# Patient Record
Sex: Female | Born: 1935 | Race: Black or African American | Hispanic: No | Marital: Single | Smoking: Never smoker
Health system: Southern US, Community
[De-identification: ages and names within clinical notes are randomized; demographics above are authoritative.]

## PROBLEM LIST (undated history)

## (undated) DIAGNOSIS — G9341 Metabolic encephalopathy: Secondary | ICD-10-CM

## (undated) DIAGNOSIS — M109 Gout, unspecified: Secondary | ICD-10-CM

## (undated) DIAGNOSIS — J40 Bronchitis, not specified as acute or chronic: Secondary | ICD-10-CM

## (undated) DIAGNOSIS — G459 Transient cerebral ischemic attack, unspecified: Secondary | ICD-10-CM

## (undated) DIAGNOSIS — E785 Hyperlipidemia, unspecified: Secondary | ICD-10-CM

## (undated) DIAGNOSIS — E119 Type 2 diabetes mellitus without complications: Secondary | ICD-10-CM

## (undated) DIAGNOSIS — I10 Essential (primary) hypertension: Secondary | ICD-10-CM

## (undated) DIAGNOSIS — N189 Chronic kidney disease, unspecified: Secondary | ICD-10-CM

## (undated) DIAGNOSIS — D696 Thrombocytopenia, unspecified: Secondary | ICD-10-CM

## (undated) DIAGNOSIS — E559 Vitamin D deficiency, unspecified: Secondary | ICD-10-CM

## (undated) DIAGNOSIS — D631 Anemia in chronic kidney disease: Secondary | ICD-10-CM

## (undated) DIAGNOSIS — I2510 Atherosclerotic heart disease of native coronary artery without angina pectoris: Secondary | ICD-10-CM

## (undated) HISTORY — PX: FRACTURE SURGERY: SHX138

---

## 2016-12-19 ENCOUNTER — Emergency Department: Payer: Medicare Other

## 2016-12-19 ENCOUNTER — Emergency Department
Admission: EM | Admit: 2016-12-19 | Discharge: 2016-12-19 | Disposition: A | Discharge status: Home / Self Care | Payer: Medicare Other | Attending: Emergency Medicine | Admitting: Emergency Medicine

## 2016-12-19 ENCOUNTER — Encounter: Payer: Self-pay | Admitting: Emergency Medicine

## 2016-12-19 DIAGNOSIS — R0781 Pleurodynia: Secondary | ICD-10-CM | POA: Diagnosis not present

## 2016-12-19 DIAGNOSIS — Y9301 Activity, walking, marching and hiking: Secondary | ICD-10-CM | POA: Diagnosis not present

## 2016-12-19 DIAGNOSIS — Y929 Unspecified place or not applicable: Secondary | ICD-10-CM | POA: Diagnosis not present

## 2016-12-19 DIAGNOSIS — E119 Type 2 diabetes mellitus without complications: Secondary | ICD-10-CM | POA: Insufficient documentation

## 2016-12-19 DIAGNOSIS — F17290 Nicotine dependence, other tobacco product, uncomplicated: Secondary | ICD-10-CM | POA: Insufficient documentation

## 2016-12-19 DIAGNOSIS — Y999 Unspecified external cause status: Secondary | ICD-10-CM | POA: Insufficient documentation

## 2016-12-19 DIAGNOSIS — W19XXXA Unspecified fall, initial encounter: Secondary | ICD-10-CM | POA: Diagnosis not present

## 2016-12-19 DIAGNOSIS — S7002XA Contusion of left hip, initial encounter: Secondary | ICD-10-CM | POA: Diagnosis not present

## 2016-12-19 DIAGNOSIS — S79912A Unspecified injury of left hip, initial encounter: Secondary | ICD-10-CM | POA: Diagnosis present

## 2016-12-19 HISTORY — DX: Type 2 diabetes mellitus without complications: E11.9

## 2016-12-19 NOTE — ED Notes (Signed)
Left hip pain status post fall last week. Pt able to bare weight, move leg and denies pain. Bilateral feet swollen. +1 pulse.

## 2016-12-19 NOTE — ED Provider Notes (Signed)
[facility] Emergency Department Provider Note  ____________________________________________   First MD Initiated Contact with Patient 12/19/16 1256     (approximate)  I have reviewed the triage vital signs and the nursing notes.   HISTORY  Chief Complaint Fall    HPI Kim Roy is a 83 y.o. female is a complaint of left hip and rib pain. Patient states that she fell possibly 4 days ago when she was walking through some grass and lost her footing. She has continued to ambulate with her cane since that time without any difficulty. She denies any head injury or loss of consciousness. She states she's been taking some Tylenol for her hip pain. She denies any previous left hip problems. She is status post right hip surgery. Patient denies any medication for arthritis. Patient denies any pain at this time and states that she was here to "get it checked out".  There was a note made by the triage nurse stating that patient had pain in her left rib however patient states that most of the pain she is experiencing is in her left hip.   Past Medical History:  Diagnosis Date  . Diabetes mellitus without complication (HCC)     There are no active problems to display for this patient.   History reviewed. No pertinent surgical history.  Prior to Admission medications   Not on File    Allergies Patient has no known allergies.  No family history on file.  Social History Social History  Substance Use Topics  . Smoking status: Never Smoker  . Smokeless tobacco: Current User    Types: Snuff  . Alcohol use No    Review of Systems Constitutional: No fever/chills Eyes: No visual changes. ENT: No trauma Cardiovascular: Denies chest pain. Respiratory: Denies shortness of breath. Positive for left rib pain. Gastrointestinal: No abdominal pain.  No nausea, no vomiting.  Musculoskeletal: Positive for left hip pain. Skin: Negative for  rash. Neurological: Negative for headaches, focal weakness or numbness. Endocrine:Positive diabetes mellitus.   ____________________________________________   PHYSICAL EXAM:  VITAL SIGNS: ED Triage Vitals  Enc Vitals Group     BP 12/19/16 1212 (!) 182/89     Pulse Rate 12/19/16 1212 79     Resp 12/19/16 1212 20     Temp 12/19/16 1212 98.7 F (37.1 C)     Temp Source 12/19/16 1212 Oral     SpO2 12/19/16 1212 98 %     Weight 12/19/16 1213 154 lb (69.9 kg)     Height 12/19/16 1213  (1.626 m)     Head Circumference --      Peak Flow --      Pain Score 12/19/16 1218 0     Pain Loc --      Pain Edu? --      Excl. in GC? --     Constitutional: Alert and oriented. Well appearing and in no acute distress.  Patient is alert, talkative, cooperative. Eyes: Conjunctivae are normal. PERRL. EOMI. Head: Atraumatic. Nose: No congestion/rhinnorhea. Neck: No stridor.  No cervical tenderness on palpation posteriorly. Cardiovascular: Normal rate, regular rhythm. Grossly normal heart sounds.  Good peripheral circulation. Respiratory: Normal respiratory effort.  No retractions. Lungs CTAB. Gastrointestinal: Soft and nontender. No distention. Bowel sounds normoactive 4 quadrants. Musculoskeletal: On examination of the left hip there is no gross deformity and no soft tissue swelling present. There is minimal tenderness on palpation of the lateral aspect. There is no ecchymosis or abrasions  seen. Range of motion in all planes is without increased pain. Patient did not show any shortening or rotation of her lower extremity in comparison with her right. Patient is weightbearing. On palpation of the left lateral ribs there is no tenderness present. No deformity and no soft tissue swelling present. Neurologic:  Normal speech and language. No gross focal neurologic deficits are appreciated. No gait instability. Skin:  Skin is warm, dry and intact. No rash noted. Psychiatric: Mood and affect are  normal. Speech and behavior are normal.  ____________________________________________   LABS (all labs ordered are listed, but only abnormal results are displayed)  Labs Reviewed - No data to display  RADIOLOGY  Left hip x-ray with pelvis per radiologist: IMPRESSION:  No acute fracture or dislocation identified about the left hip or  pelvis.   I, Tommi Rumps, personally viewed and evaluated these images (plain radiographs) as part of my medical decision making, as well as reviewing the written report by the radiologist. ____________________________________________   PROCEDURES  Procedure(s) performed: None  Procedures  Critical Care performed: No  ____________________________________________   INITIAL IMPRESSION / ASSESSMENT AND PLAN / ED COURSE  Pertinent labs & imaging results that were available during my care of the patient were reviewed by me and considered in my medical decision making (see chart for details).  Patient was reassured that she did not have a fracture of her left hip. Patient has been taking Tylenol at home for her pain and states that this has been insufficient in controlling her pain. She may also use ice packs to hip as needed for pain. She will follow-up with her primary care doctor at [facility] if any continued problems.   ____________________________________________   FINAL CLINICAL IMPRESSION(S) / ED DIAGNOSES  Final diagnoses:  Fall  Contusion of left hip, initial encounter      NEW MEDICATIONS STARTED DURING THIS VISIT:  There are no discharge medications for this patient.    Note:  This document was prepared using Dragon voice recognition software and may include unintentional dictation errors.    Tommi Rumps, PA-C 12/19/16 1520    Myrna Blazer, MD 12/19/16 [phone]

## 2016-12-19 NOTE — Discharge Instructions (Signed)
Follow-up with your  primary care doctor if any continued problems. You may continue taking Tylenol as needed for your hip pain. Also you may apply ice packs to your hip as needed for pain. Continue using your cane for walking for extra support.

## 2016-12-19 NOTE — ED Triage Notes (Signed)
Patient fell last Thursday, to ED to evaluate c/o left rib and left hip pain.  Fall was mechanical, was walking through the grass with a can and lost footing and fell onto concrete.

## 2016-12-19 NOTE — ED Triage Notes (Signed)
Pt reports fall last week, left hip pain.

## 2019-01-19 ENCOUNTER — Emergency Department: Payer: Medicare Other

## 2019-01-19 ENCOUNTER — Inpatient Hospital Stay
Admission: EM | Admit: 2019-01-19 | Discharge: 2019-01-23 | DRG: 482 | Disposition: A | Discharge status: Skilled Nursing Facility | Payer: Medicare Other | Attending: Internal Medicine | Admitting: Internal Medicine

## 2019-01-19 ENCOUNTER — Other Ambulatory Visit: Payer: Self-pay

## 2019-01-19 DIAGNOSIS — S72012A Unspecified intracapsular fracture of left femur, initial encounter for closed fracture: Secondary | ICD-10-CM | POA: Diagnosis present

## 2019-01-19 DIAGNOSIS — I10 Essential (primary) hypertension: Secondary | ICD-10-CM | POA: Diagnosis present

## 2019-01-19 DIAGNOSIS — Z23 Encounter for immunization: Secondary | ICD-10-CM

## 2019-01-19 DIAGNOSIS — E119 Type 2 diabetes mellitus without complications: Secondary | ICD-10-CM | POA: Diagnosis present

## 2019-01-19 DIAGNOSIS — Z1159 Encounter for screening for other viral diseases: Secondary | ICD-10-CM

## 2019-01-19 DIAGNOSIS — M25552 Pain in left hip: Secondary | ICD-10-CM | POA: Diagnosis present

## 2019-01-19 DIAGNOSIS — S72002A Fracture of unspecified part of neck of left femur, initial encounter for closed fracture: Secondary | ICD-10-CM | POA: Diagnosis present

## 2019-01-19 DIAGNOSIS — F17220 Nicotine dependence, chewing tobacco, uncomplicated: Secondary | ICD-10-CM | POA: Diagnosis present

## 2019-01-19 DIAGNOSIS — E1129 Type 2 diabetes mellitus with other diabetic kidney complication: Secondary | ICD-10-CM

## 2019-01-19 DIAGNOSIS — E785 Hyperlipidemia, unspecified: Secondary | ICD-10-CM | POA: Diagnosis present

## 2019-01-19 DIAGNOSIS — W19XXXA Unspecified fall, initial encounter: Secondary | ICD-10-CM

## 2019-01-19 HISTORY — DX: Essential (primary) hypertension: I10

## 2019-01-19 LAB — CBC
HCT: 39.4 % (ref 36.0–46.0)
Hemoglobin: 12.5 g/dL (ref 12.0–15.0)
MCH: 27.7 pg (ref 26.0–34.0)
MCHC: 31.7 g/dL (ref 30.0–36.0)
MCV: 87.2 fL (ref 80.0–100.0)
Platelets: 164 10*3/uL (ref 150–400)
RBC: 4.52 MIL/uL (ref 3.87–5.11)
RDW: 14.8 % (ref 11.5–15.5)
WBC: 5.1 10*3/uL (ref 4.0–10.5)
nRBC: 0 % (ref 0.0–0.2)

## 2019-01-19 NOTE — ED Notes (Signed)
patient awaiting xrays.

## 2019-01-19 NOTE — ED Provider Notes (Signed)
[facility] Emergency Department Provider Note  Time seen: 9:05 PM  I have reviewed the triage vital signs and the nursing notes.   HISTORY  Chief Complaint Leg Swelling   HPI Alinda SierrasHattie Evans Bienkowski is a 85 y.o. female with a past medical history of diabetes who presents to the emergency department after a fall yesterday.   According to the patient she fell getting out of her car yesterday, today she states continued pain in the left foot and left knee.  Patient states she has tried walking but was in pain today so she came to the emergency department for evaluation.  Patient does have lower extremity edema bilaterally however states she gets swelling from time to time per patient.  Denies any shortness of breath denies any chest pain.  Denies any fever or cough.  Past Medical History:  Diagnosis Date  . Diabetes mellitus without complication (HCC)     There are no active problems to display for this patient.   History reviewed. No pertinent surgical history.  Prior to Admission medications   Not on File    No Known Allergies  History reviewed. No pertinent family history.  Social History Social History   Tobacco Use  . Smoking status: Never Smoker  . Smokeless tobacco: Current User    Types: Snuff  Substance Use Topics  . Alcohol use: No  . Drug use: No    Review of Systems Constitutional: Negative for fever Cardiovascular: Negative for chest pain. Respiratory: Negative for shortness of breath. Gastrointestinal: Negative for abdominal pain Musculoskeletal: Lateral leg swelling.  Pain in her left ankle left knee and somewhat in the left hip. Skin: No redness/erythema.  No abrasions. Neurological: Negative for headache All other ROS negative  ____________________________________________   PHYSICAL EXAM:  VITAL SIGNS: ED Triage Vitals  Enc Vitals Group     BP 01/19/19 2049 (!) 177/98     Pulse Rate 01/19/19 2044 86     Resp --    Temp 01/19/19 2044 99 F (37.2 C)     Temp Source 01/19/19 2044 Oral     SpO2 01/19/19 2044 96 %     Weight 01/19/19 2045 134 lb (60.8 kg)     Height 01/19/19 2045 5\' 6"  (1.676 m)     Head Circumference --      Peak Flow --      Pain Score 01/19/19 2045 5     Pain Loc --      Pain Edu? --      Excl. in GC? --    Constitutional: Alert and oriented. Well appearing and in no distress. Eyes: Normal exam ENT      Head: Normocephalic and atraumatic.      Mouth/Throat: Mucous membranes are moist. Cardiovascular: Normal rate, regular rhythm. Respiratory: Normal respiratory effort without tachypnea nor retractions. Breath sounds are clear  Gastrointestinal: Soft and nontender. No distention.  Musculoskeletal: Nontender with normal range of motion in all extremities.  Neurologic:  Normal speech and language. No gross focal neurologic deficits Skin:  Skin is warm, dry and intact.  Psychiatric: Mood and affect are normal.  ____________________________________________   RADIOLOGY  Chest x-ray negative Hip x-ray shows nondisplaced subcapital femoral neck fracture. Remaining x-rays negative.  ____________________________________________   INITIAL IMPRESSION / ASSESSMENT AND PLAN / ED COURSE  Pertinent labs & imaging results that were available during my care of the patient were reviewed by me and considered in my medical decision making (see chart for details).  Patient presents to the emergency department for left leg pain after a fall yesterday.  Overall patient appears well she does have 1+ lower extremity edema but it is equal bilaterally.  Patient states this is chronic and will flareup from time to time.  Patient dates her main concern is pain in her left ankle and left knee somewhat in the left hip as well.  We will check x-rays.  Differential this time would include contusions, fracture, peripheral edema.  X-ray shows likely subcapital fracture of the left femoral neck.  We will  obtain a CT scan as a precaution to confirm.  I discussed patient with Dr. Rosita Kea as well as Dr. Anne Hahn.  Patient will be admitted pending CT confirmation.  Patient care signed out to oncoming physician.  Clarivel Irelynd Easler was evaluated in Emergency Department on 01/19/2019 for the symptoms described in the history of present illness. She was evaluated in the context of the global COVID-19 pandemic, which necessitated consideration that the patient might be at risk for infection with the SARS-CoV-2 virus that causes COVID-19. Institutional protocols and algorithms that pertain to the evaluation of patients at risk for COVID-19 are in a state of rapid change based on information released by regulatory bodies including the CDC and federal and state organizations. These policies and algorithms were followed during the patient's care in the ED.  ____________________________________________   FINAL CLINICAL IMPRESSION(S) / ED DIAGNOSES  Fall Left hip fracture    Minna Antis, MD 01/19/19 2256

## 2019-01-19 NOTE — ED Notes (Signed)
Report received. Pt has a fx hip and is to be admitted. covid test obtained but still needs iv and bloodwork sent.

## 2019-01-19 NOTE — H&P (Signed)
[facility] -  at [facility]   PATIENT NAME: Kim Roy    MR#:  [id number]  DATE OF BIRTH:  Jan 16, 1931  DATE OF ADMISSION:  01/19/2019  PRIMARY CARE PHYSICIAN: [facility]   REQUESTING/REFERRING PHYSICIAN: Lenard Lance, MD  CHIEF COMPLAINT:   Chief Complaint  Patient presents with  . Leg Swelling    HISTORY OF PRESENT ILLNESS:  Mollie Devey  is a 85 y.o. female who presents with chief complaint as above.  Patient presents to the ED reporting that she fell 2 days ago.  She states that she fell onto her left side.  She now states that that leg has started to swell.  On evaluation here she is noted to have left hip fracture.  Orthopedic surgery contacted by ED physician and recommended admission.  Hospitalist called for the same.  Note, patient denies any pain at this time  PAST MEDICAL HISTORY:   Past Medical History:  Diagnosis Date  . Diabetes mellitus without complication (HCC)   . HTN (hypertension)      PAST SURGICAL HISTORY:   Past Surgical History:  Procedure Laterality Date  . FRACTURE SURGERY       SOCIAL HISTORY:   Social History   Tobacco Use  . Smoking status: Never Smoker  . Smokeless tobacco: Current User    Types: Snuff  Substance Use Topics  . Alcohol use: No     FAMILY HISTORY:    Family history reviewed and is non-contributory DRUG ALLERGIES:  No Known Allergies  MEDICATIONS AT HOME:   Prior to Admission medications   Not on File    REVIEW OF SYSTEMS:  Review of Systems  Constitutional: Negative for chills, fever, malaise/fatigue and weight loss.  HENT: Negative for ear pain, hearing loss and tinnitus.   Eyes: Negative for blurred vision, double vision, pain and redness.  Respiratory: Negative for cough, hemoptysis and shortness of breath.   Cardiovascular: Negative for chest pain, palpitations, orthopnea and leg swelling.  Gastrointestinal: Negative for abdominal  pain, constipation, diarrhea, nausea and vomiting.  Genitourinary: Negative for dysuria, frequency and hematuria.  Musculoskeletal: Positive for falls. Negative for back pain, joint pain and neck pain.  Skin:       No acne, rash, or lesions  Neurological: Negative for dizziness, tremors, focal weakness and weakness.  Endo/Heme/Allergies: Negative for polydipsia. Does not bruise/bleed easily.  Psychiatric/Behavioral: Negative for depression. The patient is not nervous/anxious and does not have insomnia.      VITAL SIGNS:   Vitals:   01/19/19 2044 01/19/19 2045 01/19/19 2049  BP:   (!) 177/98  Pulse: 86    Temp: 99 F (37.2 C)    TempSrc: Oral    SpO2: 96%    Weight:  60.8 kg   Height:  5\' 6"  (1.676 m)    Wt Readings from Last 3 Encounters:  01/19/19 60.8 kg  12/19/16 69.9 kg    PHYSICAL EXAMINATION:  Physical Exam  Vitals reviewed. Constitutional: She is oriented to person, place, and time. She appears well-developed and well-nourished. No distress.  HENT:  Head: Normocephalic and atraumatic.  Mouth/Throat: Oropharynx is clear and moist.  Eyes: Pupils are equal, round, and reactive to light. Conjunctivae and EOM are normal. No scleral icterus.  Neck: Normal range of motion. Neck supple. No JVD present. No thyromegaly present.  Cardiovascular: Normal rate, regular rhythm and intact distal pulses. Exam reveals no gallop and no friction rub.  No murmur heard. Respiratory: Effort  normal and breath sounds normal. No respiratory distress. She has no wheezes. She has no rales.  GI: Soft. Bowel sounds are normal. She exhibits no distension. There is no abdominal tenderness.  Musculoskeletal: Normal range of motion.        General: Tenderness (Left hip tenderness to palpation) present. No edema.     Comments: No arthritis, no gout  Lymphadenopathy:    She has no cervical adenopathy.  Neurological: She is alert and oriented to person, place, and time. No cranial nerve deficit.  No  dysarthria, no aphasia  Skin: Skin is warm and dry. No rash noted. No erythema.  Psychiatric: She has a normal mood and affect. Her behavior is normal. Judgment and thought content normal.    LABORATORY PANEL:   CBC No results for input(s): WBC, HGB, HCT, PLT in the last 168 hours. ------------------------------------------------------------------------------------------------------------------  Chemistries  No results for input(s): NA, K, CL, CO2, GLUCOSE, BUN, CREATININE, CALCIUM, MG, AST, ALT, ALKPHOS, BILITOT in the last 168 hours.  Invalid input(s): GFRCGP ------------------------------------------------------------------------------------------------------------------  Cardiac Enzymes No results for input(s): TROPONINI in the last 168 hours. ------------------------------------------------------------------------------------------------------------------  RADIOLOGY:  Dg Chest 1 View  Result Date: 01/19/2019 CLINICAL DATA:  Preop hip fracture EXAM: CHEST  1 VIEW COMPARISON:  None. FINDINGS: Cardiomegaly. Interstitial prominence within the lungs, favor chronic lung disease. Lingular scarring. No effusions or acute bony abnormality. IMPRESSION: Cardiomegaly. Interstitial prominence, likely chronic interstitial lung disease. Lingular scarring. Electronically Signed   By: Charlett NoseKevin  Dover M.D.   On: 01/19/2019 22:12   Dg Pelvis 1-2 Views  Result Date: 01/19/2019 CLINICAL DATA:  Fall EXAM: PELVIS - 1-2 VIEW COMPARISON:  None. FINDINGS: There is a left femoral neck fracture. No angulation. No subluxation or dislocation. Screws within the right proximal femur related to remote injury. IMPRESSION: Nondisplaced left femoral neck fracture. Electronically Signed   By: Charlett NoseKevin  Dover M.D.   On: 01/19/2019 21:38   Dg Ankle Complete Left  Result Date: 01/19/2019 CLINICAL DATA:  Fall EXAM: LEFT ANKLE COMPLETE - 3+ VIEW COMPARISON:  None. FINDINGS: No fracture or malalignment. Ankle mortise is  symmetric. Diffuse soft tissue swelling. Moderate plantar calcaneal spur IMPRESSION: No acute osseous abnormality Electronically Signed   By: Jasmine PangKim  Fujinaga M.D.   On: 01/19/2019 21:40   Dg Knee Complete 4 Views Left  Result Date: 01/19/2019 CLINICAL DATA:  Fall EXAM: LEFT KNEE - COMPLETE 4+ VIEW COMPARISON:  None. FINDINGS: Degenerative changes diffusely throughout the left knee. No acute bony abnormality. Specifically, no fracture, subluxation, or dislocation. No joint effusion. Vascular calcification. IMPRESSION: Tricompartment degenerative changes.  No acute bony abnormality. Electronically Signed   By: Charlett NoseKevin  Dover M.D.   On: 01/19/2019 21:38   Dg Hip Unilat With Pelvis 2-3 Views Left  Result Date: 01/19/2019 CLINICAL DATA:  Fall with left hip pain EXAM: DG HIP (WITH OR WITHOUT PELVIS) 2-3V LEFT COMPARISON:  12/19/2016 FINDINGS: Suspect a subcapital femoral neck fracture with slight posterior impaction seen on the cross-table lateral. Femoral neck is foreshortened on the AP view. No dislocation. IMPRESSION: Suspect a nondisplaced subcapital femoral neck fracture. Recommend CT for confirmation Electronically Signed   By: Marnee SpringJonathon  Watts M.D.   On: 01/19/2019 22:11    EKG:   Orders placed or performed during the hospital encounter of 01/19/19  . ED EKG  . ED EKG    IMPRESSION AND PLAN:  Principal Problem:   Closed left hip fracture [facility](HCC) -orthopedic surgery consult for primary management of this problem Active Problems:   Diabetes (  HCC) -sliding scale insulin coverage   HTN (hypertension) -home dose antihypertensives  Chart review performed and case discussed with ED provider. Labs, imaging and/or ECG reviewed by provider and discussed with patient/family. Management plans discussed with the patient and/or family.  COVID-19 status: Test result pending  DVT PROPHYLAXIS: Mechanical only  GI PROPHYLAXIS:  None  ADMISSION STATUS: Inpatient     CODE STATUS: Full  TOTAL TIME TAKING  CARE OF THIS PATIENT: 45 minutes.   This patient was evaluated in the context of the global COVID-19 pandemic, which necessitated consideration that the patient might be at risk for infection with the SARS-CoV-2 virus that causes COVID-19. Institutional protocols and algorithms that pertain to the evaluation of patients at risk for COVID-19 are in a state of rapid change based on information released by regulatory bodies including the CDC and federal and state organizations. These policies and algorithms were followed to the best of this provider's knowledge to date during the patient's care at this facility.  Barney Drain 01/19/2019, 11:20 PM  [facility]  Office  [phone]  CC: Primary care physician; [facility]  Note:  This document was prepared using Dragon voice recognition software and may include unintentional dictation errors.

## 2019-01-19 NOTE — ED Triage Notes (Signed)
Patient reports s/p fall getting out of car yesterday, presents to ed with c/o of swelling to bilateral lower extremities and pain to left hip.

## 2019-01-19 NOTE — ED Notes (Signed)
Reports falling when getting out of care yesterday, c/o of pain to left hip that began last night, also concerned about swelling to bilateral lower extremities. Denies cp/sob or any other concerns.

## 2019-01-20 ENCOUNTER — Inpatient Hospital Stay: Payer: Medicare Other

## 2019-01-20 ENCOUNTER — Encounter
Admission: EM | Disposition: A | Discharge status: Skilled Nursing Facility | Payer: Self-pay | Source: Home / Self Care | Attending: Internal Medicine

## 2019-01-20 ENCOUNTER — Inpatient Hospital Stay: Payer: Medicare Other | Admitting: Anesthesiology

## 2019-01-20 ENCOUNTER — Encounter: Payer: Self-pay | Admitting: Anesthesiology

## 2019-01-20 HISTORY — PX: HIP PINNING,CANNULATED: SHX1758

## 2019-01-20 LAB — GLUCOSE, CAPILLARY
Glucose-Capillary: 127 mg/dL — ABNORMAL HIGH (ref 70–99)
Glucose-Capillary: 112 mg/dL — ABNORMAL HIGH (ref 70–99)
Glucose-Capillary: 123 mg/dL — ABNORMAL HIGH (ref 70–99)
Glucose-Capillary: 118 mg/dL — ABNORMAL HIGH (ref 70–99)

## 2019-01-20 LAB — CBC
HCT: 39.1 % (ref 36.0–46.0)
Hemoglobin: 12.1 g/dL (ref 12.0–15.0)
MCH: 27.3 pg (ref 26.0–34.0)
MCHC: 30.9 g/dL (ref 30.0–36.0)
MCV: 88.3 fL (ref 80.0–100.0)
Platelets: 160 10*3/uL (ref 150–400)
RBC: 4.43 MIL/uL (ref 3.87–5.11)
RDW: 14.7 % (ref 11.5–15.5)
WBC: 5 10*3/uL (ref 4.0–10.5)
nRBC: 0 % (ref 0.0–0.2)

## 2019-01-20 LAB — BASIC METABOLIC PANEL
Anion gap: 10 (ref 5–15)
Anion gap: 7 (ref 5–15)
BUN: 13 mg/dL (ref 8–23)
BUN: 14 mg/dL (ref 8–23)
CO2: 28 mmol/L (ref 22–32)
CO2: 29 mmol/L (ref 22–32)
Calcium: 9 mg/dL (ref 8.9–10.3)
Calcium: 9.1 mg/dL (ref 8.9–10.3)
Chloride: 101 mmol/L (ref 98–111)
Chloride: 104 mmol/L (ref 98–111)
Creatinine, Ser: 0.68 mg/dL (ref 0.44–1.00)
Creatinine, Ser: 0.72 mg/dL (ref 0.44–1.00)
GFR calc Af Amer: >60 mL/min (ref >60)
GFR calc Af Amer: >60 mL/min (ref >60)
GFR calc non Af Amer: >60 mL/min (ref >60)
GFR calc non Af Amer: >60 mL/min (ref >60)
Glucose, Bld: 140 mg/dL — ABNORMAL HIGH (ref 70–99)
Glucose, Bld: 155 mg/dL — ABNORMAL HIGH (ref 70–99)
Potassium: 3.6 mmol/L (ref 3.5–5.1)
Potassium: 3.9 mmol/L (ref 3.5–5.1)
Sodium: 139 mmol/L (ref 135–145)
Sodium: 140 mmol/L (ref 135–145)

## 2019-01-20 LAB — SARS CORONAVIRUS 2 BY RT PCR (HOSPITAL ORDER, PERFORMED IN ~~LOC~~ HOSPITAL LAB): SARS Coronavirus 2: NEGATIVE

## 2019-01-20 SURGERY — FIXATION, FEMUR, NECK, PERCUTANEOUS, USING SCREW
Anesthesia: Spinal | Laterality: Left

## 2019-01-20 MED ORDER — INSULIN ASPART 100 UNIT/ML ~~LOC~~ SOLN
0.0000 [IU] | Freq: Four times a day (QID) | SUBCUTANEOUS | Status: DC
  Administered 2019-01-21: 2 [IU] via SUBCUTANEOUS
  Administered 2019-01-21 (×2): 1 [IU] via SUBCUTANEOUS
  Administered 2019-01-22 – 2019-01-23 (×2): 2 [IU] via SUBCUTANEOUS
  Filled 2019-01-20 (×5): qty 1

## 2019-01-20 MED ORDER — METFORMIN HCL 500 MG PO TABS
1000.0000 mg | ORAL_TABLET | Freq: Every day | ORAL | Status: DC
  Administered 2019-01-20 – 2019-01-22 (×3): 1000 mg via ORAL
  Filled 2019-01-20 (×4): qty 2

## 2019-01-20 MED ORDER — SODIUM CHLORIDE 0.9 % IV SOLN
INTRAVENOUS | Status: DC
  Administered 2019-01-20: 22:00:00 via INTRAVENOUS

## 2019-01-20 MED ORDER — PROPOFOL 500 MG/50ML IV EMUL
INTRAVENOUS | Status: DC | PRN
  Administered 2019-01-20: 50 ug/kg/min via INTRAVENOUS

## 2019-01-20 MED ORDER — ATORVASTATIN CALCIUM 20 MG PO TABS
40.0000 mg | ORAL_TABLET | Freq: Every day | ORAL | Status: DC
  Administered 2019-01-21 – 2019-01-22 (×2): 40 mg via ORAL
  Filled 2019-01-20 (×3): qty 2

## 2019-01-20 MED ORDER — BUPIVACAINE HCL (PF) 0.5 % IJ SOLN
INTRAMUSCULAR | Status: DC | PRN
  Administered 2019-01-20: 2.5 mL

## 2019-01-20 MED ORDER — MAGNESIUM CITRATE PO SOLN
1.0000 | Freq: Once | ORAL | Status: DC | PRN
  Filled 2019-01-20: qty 296

## 2019-01-20 MED ORDER — DOCUSATE SODIUM 100 MG PO CAPS
100.0000 mg | ORAL_CAPSULE | Freq: Two times a day (BID) | ORAL | Status: DC
  Administered 2019-01-20 – 2019-01-23 (×6): 100 mg via ORAL
  Filled 2019-01-20 (×6): qty 1

## 2019-01-20 MED ORDER — ACETAMINOPHEN 500 MG PO TABS
500.0000 mg | ORAL_TABLET | Freq: Four times a day (QID) | ORAL | Status: AC
  Administered 2019-01-20 – 2019-01-21 (×4): 500 mg via ORAL
  Filled 2019-01-20 (×4): qty 1

## 2019-01-20 MED ORDER — ASPIRIN 81 MG PO TBEC: 81.0000 mg | DELAYED_RELEASE_TABLET | Freq: Every day | ORAL | Status: DC

## 2019-01-20 MED ORDER — ZOLPIDEM TARTRATE 5 MG PO TABS: 5.0000 mg | ORAL_TABLET | Freq: Every evening | ORAL | Status: DC | PRN

## 2019-01-20 MED ORDER — FENTANYL CITRATE (PF) 100 MCG/2ML IJ SOLN: 25.0000 ug | INTRAMUSCULAR | Status: DC | PRN

## 2019-01-20 MED ORDER — LORATADINE 10 MG PO TABS
10.0000 mg | ORAL_TABLET | Freq: Every day | ORAL | Status: DC
  Administered 2019-01-21 – 2019-01-23 (×3): 10 mg via ORAL
  Filled 2019-01-20 (×3): qty 1

## 2019-01-20 MED ORDER — PROPOFOL 10 MG/ML IV BOLUS
INTRAVENOUS | Status: DC | PRN
  Administered 2019-01-20: 30 mg via INTRAVENOUS

## 2019-01-20 MED ORDER — CEFAZOLIN SODIUM-DEXTROSE 1-4 GM/50ML-% IV SOLN
INTRAVENOUS | Status: DC | PRN
  Administered 2019-01-20: 1 g via INTRAVENOUS

## 2019-01-20 MED ORDER — LISINOPRIL-HYDROCHLOROTHIAZIDE 20-25 MG PO TABS: 1.0000 | ORAL_TABLET | Freq: Every day | ORAL | Status: DC

## 2019-01-20 MED ORDER — AMLODIPINE BESYLATE 10 MG PO TABS
10.0000 mg | ORAL_TABLET | Freq: Every day | ORAL | Status: DC
  Administered 2019-01-20 – 2019-01-22 (×3): 10 mg via ORAL
  Filled 2019-01-20 (×3): qty 1

## 2019-01-20 MED ORDER — NEOMYCIN-POLYMYXIN B GU 40-200000 IR SOLN
Status: DC | PRN
  Administered 2019-01-20: 4 mL

## 2019-01-20 MED ORDER — ONDANSETRON HCL 4 MG PO TABS: 4.0000 mg | ORAL_TABLET | Freq: Four times a day (QID) | ORAL | Status: DC | PRN

## 2019-01-20 MED ORDER — OXYCODONE HCL 5 MG PO TABS
5.0000 mg | ORAL_TABLET | ORAL | Status: DC | PRN
  Administered 2019-01-21: 5 mg via ORAL
  Filled 2019-01-20 (×2): qty 1

## 2019-01-20 MED ORDER — ACETAMINOPHEN 325 MG PO TABS
650.0000 mg | ORAL_TABLET | Freq: Four times a day (QID) | ORAL | Status: DC | PRN
  Administered 2019-01-23: 650 mg via ORAL
  Filled 2019-01-20: qty 2

## 2019-01-20 MED ORDER — KETOROLAC TROMETHAMINE 30 MG/ML IJ SOLN
INTRAMUSCULAR | Status: AC
  Filled 2019-01-20: qty 1

## 2019-01-20 MED ORDER — CEFAZOLIN SODIUM-DEXTROSE 1-4 GM/50ML-% IV SOLN
1.0000 g | Freq: Once | INTRAVENOUS | Status: AC
  Administered 2019-01-20: 1 g via INTRAVENOUS
  Filled 2019-01-20: qty 50

## 2019-01-20 MED ORDER — METHOCARBAMOL 1000 MG/10ML IJ SOLN
500.0000 mg | Freq: Four times a day (QID) | INTRAVENOUS | Status: DC | PRN
  Filled 2019-01-20: qty 5

## 2019-01-20 MED ORDER — GLIPIZIDE 10 MG PO TABS
10.0000 mg | ORAL_TABLET | Freq: Two times a day (BID) | ORAL | Status: DC
  Administered 2019-01-20 – 2019-01-22 (×4): 10 mg via ORAL
  Filled 2019-01-20 (×6): qty 1

## 2019-01-20 MED ORDER — MORPHINE SULFATE (PF) 2 MG/ML IV SOLN: 0.5000 mg | INTRAVENOUS | Status: DC | PRN

## 2019-01-20 MED ORDER — SODIUM CHLORIDE 0.9 % IV SOLN
INTRAVENOUS | Status: AC
  Administered 2019-01-20: 08:00:00 via INTRAVENOUS

## 2019-01-20 MED ORDER — HYDROCHLOROTHIAZIDE 25 MG PO TABS
25.0000 mg | ORAL_TABLET | Freq: Every day | ORAL | Status: DC
  Administered 2019-01-20: 25 mg via ORAL
  Filled 2019-01-20: qty 1

## 2019-01-20 MED ORDER — METHOCARBAMOL 500 MG PO TABS: 500.0000 mg | ORAL_TABLET | Freq: Four times a day (QID) | ORAL | Status: DC | PRN

## 2019-01-20 MED ORDER — ASPIRIN EC 325 MG PO TBEC
325.0000 mg | DELAYED_RELEASE_TABLET | Freq: Every day | ORAL | Status: DC
  Administered 2019-01-21 – 2019-01-23 (×3): 325 mg via ORAL
  Filled 2019-01-20 (×3): qty 1

## 2019-01-20 MED ORDER — METOCLOPRAMIDE HCL 10 MG PO TABS: 5.0000 mg | ORAL_TABLET | Freq: Three times a day (TID) | ORAL | Status: DC | PRN

## 2019-01-20 MED ORDER — HYDROCODONE-ACETAMINOPHEN 5-325 MG PO TABS
1.0000 | ORAL_TABLET | ORAL | Status: DC | PRN
  Administered 2019-01-21 – 2019-01-22 (×2): 1 via ORAL
  Filled 2019-01-20 (×2): qty 1

## 2019-01-20 MED ORDER — MIDAZOLAM HCL 2 MG/2ML IJ SOLN
INTRAMUSCULAR | Status: AC
  Filled 2019-01-20: qty 2

## 2019-01-20 MED ORDER — PROPOFOL 500 MG/50ML IV EMUL
INTRAVENOUS | Status: AC
  Filled 2019-01-20: qty 50

## 2019-01-20 MED ORDER — ALUM & MAG HYDROXIDE-SIMETH 200-200-20 MG/5ML PO SUSP: 30.0000 mL | ORAL | Status: DC | PRN

## 2019-01-20 MED ORDER — MAGNESIUM HYDROXIDE 400 MG/5ML PO SUSP
30.0000 mL | Freq: Every day | ORAL | Status: DC | PRN
  Administered 2019-01-22: 30 mL via ORAL
  Filled 2019-01-20 (×3): qty 30

## 2019-01-20 MED ORDER — METOCLOPRAMIDE HCL 5 MG/ML IJ SOLN: 5.0000 mg | Freq: Three times a day (TID) | INTRAMUSCULAR | Status: DC | PRN

## 2019-01-20 MED ORDER — CARVEDILOL 12.5 MG PO TABS
12.5000 mg | ORAL_TABLET | Freq: Two times a day (BID) | ORAL | Status: DC
  Administered 2019-01-20 – 2019-01-22 (×5): 12.5 mg via ORAL
  Filled 2019-01-20 (×6): qty 1

## 2019-01-20 MED ORDER — MIDAZOLAM HCL 5 MG/5ML IJ SOLN
INTRAMUSCULAR | Status: DC | PRN
  Administered 2019-01-20 (×2): 1 mg via INTRAVENOUS

## 2019-01-20 MED ORDER — LISINOPRIL 20 MG PO TABS
20.0000 mg | ORAL_TABLET | Freq: Every day | ORAL | Status: DC
  Administered 2019-01-20 – 2019-01-22 (×3): 20 mg via ORAL
  Filled 2019-01-20 (×3): qty 1

## 2019-01-20 MED ORDER — CEFAZOLIN SODIUM-DEXTROSE 2-4 GM/100ML-% IV SOLN
2.0000 g | Freq: Four times a day (QID) | INTRAVENOUS | Status: AC
  Administered 2019-01-21 (×3): 2 g via INTRAVENOUS
  Filled 2019-01-20 (×4): qty 100

## 2019-01-20 MED ORDER — ACETAMINOPHEN 650 MG RE SUPP
650.0000 mg | Freq: Four times a day (QID) | RECTAL | Status: DC | PRN
  Filled 2019-01-20: qty 1

## 2019-01-20 MED ORDER — BISACODYL 10 MG RE SUPP: 10.0000 mg | Freq: Every day | RECTAL | Status: DC | PRN

## 2019-01-20 MED ORDER — PNEUMOCOCCAL VAC POLYVALENT 25 MCG/0.5ML IJ INJ
0.5000 mL | INJECTION | INTRAMUSCULAR | Status: AC
  Administered 2019-01-22: 0.5 mL via INTRAMUSCULAR
  Filled 2019-01-20: qty 0.5

## 2019-01-20 MED ORDER — ONDANSETRON HCL 4 MG/2ML IJ SOLN: 4.0000 mg | Freq: Four times a day (QID) | INTRAMUSCULAR | Status: DC | PRN

## 2019-01-20 MED ORDER — PHENOL 1.4 % MT LIQD
1.0000 | OROMUCOSAL | Status: DC | PRN
  Filled 2019-01-20: qty 177

## 2019-01-20 MED ORDER — MENTHOL 3 MG MT LOZG
1.0000 | LOZENGE | OROMUCOSAL | Status: DC | PRN
  Filled 2019-01-20: qty 9

## 2019-01-20 SURGICAL SUPPLY — 31 items
CANISTER SUCT 1200ML W/VALVE (MISCELLANEOUS) IMPLANT
CHLORAPREP W/TINT 26 (MISCELLANEOUS) ×3 IMPLANT
COVER WAND RF STERILE (DRAPES) ×3 IMPLANT
DRSG OPSITE POSTOP 4X6 (GAUZE/BANDAGES/DRESSINGS) ×3 IMPLANT
DRSG TEGADERM 6X8 (GAUZE/BANDAGES/DRESSINGS) ×3 IMPLANT
ELECT REM PT RETURN 9FT ADLT (ELECTROSURGICAL) ×3
ELECTRODE REM PT RTRN 9FT ADLT (ELECTROSURGICAL) ×1 IMPLANT
GAUZE SPONGE 4X4 12PLY STRL (GAUZE/BANDAGES/DRESSINGS) IMPLANT
GAUZE XEROFORM 1X8 LF (GAUZE/BANDAGES/DRESSINGS) ×3 IMPLANT
GLOVE BIOGEL PI IND STRL 9 (GLOVE) ×1 IMPLANT
GLOVE BIOGEL PI INDICATOR 9 (GLOVE) ×2
GLOVE SURG SYN 9.0  PF PI (GLOVE) ×6
GLOVE SURG SYN 9.0 PF PI (GLOVE) ×3 IMPLANT
GOWN SRG 2XL LVL 4 RGLN SLV (GOWNS) ×1 IMPLANT
GOWN STRL NON-REIN 2XL LVL4 (GOWNS) ×2
GOWN STRL REUS W/ TWL LRG LVL3 (GOWN DISPOSABLE) ×1 IMPLANT
GOWN STRL REUS W/TWL LRG LVL3 (GOWN DISPOSABLE) ×2
KIT TURNOVER KIT A (KITS) ×3 IMPLANT
NEEDLE FILTER BLUNT 18X 1/2SAF (NEEDLE) ×2
NEEDLE FILTER BLUNT 18X1 1/2 (NEEDLE) ×1 IMPLANT
NS IRRIG 500ML POUR BTL (IV SOLUTION) ×3 IMPLANT
PACK HIP COMPR (MISCELLANEOUS) ×3 IMPLANT
SCALPEL PROTECTED #10 DISP (BLADE) ×6 IMPLANT
SCREW CANN 32 THRD/75 7.3 (Screw) ×6 IMPLANT
SCREW CANN 32 THRD/80 7.3 (Screw) ×3 IMPLANT
STAPLER SKIN PROX 35W (STAPLE) ×3 IMPLANT
SUT PROLENE 2 0 FS (SUTURE) ×3 IMPLANT
SUT VIC AB 2-0 SH 27 (SUTURE) ×2
SUT VIC AB 2-0 SH 27XBRD (SUTURE) ×1 IMPLANT
SYR 5ML LL (SYRINGE) ×3 IMPLANT
TAPE MICROFOAM 4IN (TAPE) IMPLANT

## 2019-01-20 NOTE — ED Notes (Signed)
AM medications have not been sent from the Pharmacy.  Message sent to pharmacy requesting medications.

## 2019-01-20 NOTE — Anesthesia Post-op Follow-up Note (Signed)
Anesthesia QCDR form completed.        

## 2019-01-20 NOTE — ED Notes (Signed)
PT cleaned of wet brief, PurWick placed.  Pt in hospital gown at this time.  Pt voices no c/o.  Dr. Rosita Kea by to see pt, pt to go to surgery later today.

## 2019-01-20 NOTE — ED Notes (Signed)
Daughter-in-law called for update.  Explained POC and reason for wait.  Asking for explanation of planned procedure, told her that I would get her contact information to Dr. Rosita Kea and have him call her.

## 2019-01-20 NOTE — ED Notes (Signed)
Daughter-in-law called and given update, explained that pt is heading to OR at this time and that she should call the switchboard in a few hours to find out pt's room.

## 2019-01-20 NOTE — ED Notes (Signed)
Report to Jasmine December, Charity fundraiser in [place]

## 2019-01-20 NOTE — Transfer of Care (Signed)
Immediate Anesthesia Transfer of Care Note  Patient: Kim Roy  Procedure(s) Performed: CANNULATED HIP PINNING (Left )  Patient Location: PACU  Anesthesia Type:Spinal  Level of Consciousness: sedated  Airway & Oxygen Therapy: Patient connected to face mask oxygen  Post-op Assessment: Post -op Vital signs reviewed and stable  Post vital signs: stable  Last Vitals:  Vitals Value Taken Time  BP 105/73 01/20/2019  5:33 PM  Temp 36.4 C 01/20/2019  5:33 PM  Pulse    Resp 28 01/20/2019  5:33 PM  SpO2    Vitals shown include unvalidated device data.  Last Pain:  Vitals:   01/20/19 0352  TempSrc:   PainSc: Asleep         Complications: No apparent anesthesia complications

## 2019-01-20 NOTE — Consult Note (Addendum)
85 yo female with minimally displaced left femoral neck fracture. Plan hip pinning later today.  Unable to get son on phone to discuss further. Site marked.  Patient is unable to give history.  She does have a history of prior right hip pinning with x-ray showing an impacted femoral neck fracture.  The left hip fracture is impacted with slight posterior angulation so there is concern for potential loss of fixation.  Plan will be for multiple hip pinning left hip in hopes of allowing her to remain ambulatory.  Hope to discuss this with family prior to surgery if I can find a working number.  I discussed the situation with her daughter after getting her phone number, discussed the fact that there is some angulation and there is a chance that the surgery may fail and she might require further surgery but hopes that it will go as her prior right hip did with multiple screw pinning of the hip.

## 2019-01-20 NOTE — Progress Notes (Signed)
[employer] - [place] at [facility]                                                                                                                                                                                  Patient Demographics   Kim Roy, is a 85 y.o. female, DOB - 03-09-1931, ZOX:[id number]  Admit date - 01/19/2019   Admitting Physician No admitting provider for patient encounter.  Outpatient Primary MD for the patient is [facility]   LOS - 1  Subjective: Patient's pain under control Limited historian   Review of Systems:   CONSTITUTIONAL: Limited historian  Vitals:   Vitals:   01/20/19 0402 01/20/19 0609 01/20/19 0820 01/20/19 1348  BP: (!) 171/108 (!) 152/68 (!) 165/92 (!) 182/91  Pulse: 84 76 82 78  Resp: Temp:      TempSrc:      SpO2: 97% 96% 99% 98%  Weight:      Height:        Wt Readings from Last 3 Encounters:  01/19/19 60.8 kg  12/19/16 69.9 kg    No intake or output data in the 24 hours ending 01/20/19 1427  Physical Exam:   GENERAL: Pleasant-appearing in no apparent distress.  HEAD, EYES, EARS, NOSE AND THROAT: Atraumatic, normocephalic. Extraocular muscles are intact. Pupils equal and reactive to light. Sclerae anicteric. No conjunctival injection. No oro-pharyngeal erythema.  NECK: Supple. There is no jugular venous distention. No bruits, no lymphadenopathy, no thyromegaly.  HEART: Regular rate and rhythm,. No murmurs, no rubs, no clicks.  LUNGS: Clear to auscultation bilaterally. No rales or rhonchi. No wheezes.  ABDOMEN: Soft, flat, nontender, nondistended. Has good bowel sounds. No hepatosplenomegaly appreciated.  EXTREMITIES: No evidence of any cyanosis, clubbing, or peripheral edema.  +2 pedal and radial pulses bilaterally.  NEUROLOGIC: The patient is alert, awake,  with no focal motor or sensory deficits appreciated bilaterally.  SKIN: Moist and warm with no rashes appreciated.   Psych: Not anxious, depressed LN: No inguinal LN enlargement    Antibiotics   Anti-infectives (From admission, onward)   Start     Dose/Rate Route Frequency Ordered Stop   01/20/19 0700  ceFAZolin (ANCEF) IVPB 1 g/50 mL premix     1 g 100 mL/hr over 30 Minutes Intravenous  Once 01/20/19 0645 01/20/19 0900      Medications   Scheduled Meds: . insulin aspart  0-9 Units Subcutaneous Q6H   Continuous Infusions: . sodium chloride 50 mL/hr at 01/20/19 0825   PRN Meds:.acetaminophen **OR** acetaminophen, ondansetron **OR** ondansetron (ZOFRAN) IV, oxyCODONE   Data Review:   Micro Results Recent Results (  from the past 240 hour(s))  SARS Coronavirus 2 (CEPHEID - Performed in [facility] hospital lab), Hosp Order     Status: None   Collection Time: 01/19/19 10:59 PM  Result Value Ref Range Status   SARS Coronavirus 2 NEGATIVE NEGATIVE Final    Comment: (NOTE) If result is NEGATIVE SARS-CoV-2 target nucleic acids are NOT DETECTED. The SARS-CoV-2 RNA is generally detectable in upper and lower  respiratory specimens during the acute phase of infection. The lowest  concentration of SARS-CoV-2 viral copies this assay can detect is 250  copies / mL. A negative result does not preclude SARS-CoV-2 infection  and should not be used as the sole basis for treatment or other  patient management decisions.  A negative result may occur with  improper specimen collection / handling, submission of specimen other  than nasopharyngeal swab, presence of viral mutation(s) within the  areas targeted by this assay, and inadequate number of viral copies  (<250 copies / mL). A negative result must be combined with clinical  observations, patient history, and epidemiological information. If result is POSITIVE SARS-CoV-2 target nucleic acids are DETECTED. The SARS-CoV-2 RNA is generally detectable in upper and lower  respiratory specimens dur ing the acute phase of infection.  Positive  results are  indicative of active infection with SARS-CoV-2.  Clinical  correlation with patient history and other diagnostic information is  necessary to determine patient infection status.  Positive results do  not rule out bacterial infection or co-infection with other viruses. If result is PRESUMPTIVE POSTIVE SARS-CoV-2 nucleic acids MAY BE PRESENT.   A presumptive positive result was obtained on the submitted specimen  and confirmed on repeat testing.  While 2019 novel coronavirus  (SARS-CoV-2) nucleic acids may be present in the submitted sample  additional confirmatory testing may be necessary for epidemiological  and / or clinical management purposes  to differentiate between  SARS-CoV-2 and other Sarbecovirus currently known to infect humans.  If clinically indicated additional testing with an alternate test  methodology [id number]) is advised. The SARS-CoV-2 RNA is generally  detectable in upper and lower respiratory sp ecimens during the acute  phase of infection. The expected result is Negative. Fact Sheet for Patients:  BoilerBrush.com.cyhttps://www.fda.gov/media/136312/download Fact Sheet for Healthcare Providers: https://pope.com/https://www.fda.gov/media/136313/download This test is not yet approved or cleared by the [place] FDA and has been authorized for detection and/or diagnosis of SARS-CoV-2 by FDA under an Emergency Use Authorization (EUA).  This EUA will remain in effect (meaning this test can be used) for the duration of the COVID-19 declaration under Section 564(b)(1) of the Act, 21 U.S.C. section 360bbb-3(b)(1), unless the authorization is terminated or revoked sooner. Performed at [facility], [address] [place], [place] [address]     Radiology Reports Dg Chest 1 View  Result Date: 01/19/2019 CLINICAL DATA:  Preop hip fracture EXAM: CHEST  1 VIEW COMPARISON:  None. FINDINGS: Cardiomegaly. Interstitial prominence within the lungs, favor chronic lung disease. Lingular scarring. No  effusions or acute bony abnormality. IMPRESSION: Cardiomegaly. Interstitial prominence, likely chronic interstitial lung disease. Lingular scarring. Electronically Signed   By: Charlett NoseKevin  Dover M.D.   On: 01/19/2019 22:12   Dg Pelvis 1-2 Views  Result Date: 01/19/2019 CLINICAL DATA:  Fall EXAM: PELVIS - 1-2 VIEW COMPARISON:  None. FINDINGS: There is a left femoral neck fracture. No angulation. No subluxation or dislocation. Screws within the right proximal femur related to remote injury. IMPRESSION: Nondisplaced left femoral neck fracture. Electronically Signed   By: Caryn BeeKevin  Dover M.D.   On: 01/19/2019 21:38   Dg Ankle Complete Left  Result Date: 01/19/2019 CLINICAL DATA:  Fall EXAM: LEFT ANKLE COMPLETE - 3+ VIEW COMPARISON:  None. FINDINGS: No fracture or malalignment. Ankle mortise is symmetric. Diffuse soft tissue swelling. Moderate plantar calcaneal spur IMPRESSION: No acute osseous abnormality Electronically Signed   By: Jasmine Pang M.D.   On: 01/19/2019 21:40   Ct Hip Left Wo Contrast  Result Date: 01/19/2019 CLINICAL DATA:  Fall.  Concern for femoral neck fracture on x-rays EXAM: CT OF THE LEFT HIP WITHOUT CONTRAST TECHNIQUE: Multidetector CT imaging of the left hip was performed according to the standard protocol. Multiplanar CT image reconstructions were also generated. COMPARISON:  Left hip plain films 01/19/2019 FINDINGS: There is a nondisplaced left femoral neck fracture, best seen on coronal reconstructed images. No angulation. No subluxation or dislocation. IMPRESSION: Nondisplaced left femoral neck fracture. Electronically Signed   By: Charlett Nose M.D.   On: 01/19/2019 23:41   Dg Knee Complete 4 Views Left  Result Date: 01/19/2019 CLINICAL DATA:  Fall EXAM: LEFT KNEE - COMPLETE 4+ VIEW COMPARISON:  None. FINDINGS: Degenerative changes diffusely throughout the left knee. No acute bony abnormality. Specifically, no fracture, subluxation, or dislocation. No joint effusion. Vascular  calcification. IMPRESSION: Tricompartment degenerative changes.  No acute bony abnormality. Electronically Signed   By: Charlett Nose M.D.   On: 01/19/2019 21:38   Dg Hip Unilat With Pelvis 2-3 Views Left  Result Date: 01/19/2019 CLINICAL DATA:  Fall with left hip pain EXAM: DG HIP (WITH OR WITHOUT PELVIS) 2-3V LEFT COMPARISON:  12/19/2016 FINDINGS: Suspect a subcapital femoral neck fracture with slight posterior impaction seen on the cross-table lateral. Femoral neck is foreshortened on the AP view. No dislocation. IMPRESSION: Suspect a nondisplaced subcapital femoral neck fracture. Recommend CT for confirmation Electronically Signed   By: Marnee Spring M.D.   On: 01/19/2019 22:11     CBC Recent Labs  Lab 01/19/19 2331 01/20/19 0819  WBC 5.1 5.0  HGB 12.5 12.1  HCT 39.4 39.1  PLT 164 160  MCV 87.2 88.3  MCH 27.7 27.3  MCHC 31.7 30.9  RDW 14.8 14.7    Chemistries  Recent Labs  Lab 01/19/19 2331 01/20/19 0819  NA 139 140  K 3.6 3.9  CL 101 104  CO2 28 29  GLUCOSE 155* 140*  BUN 13 14  CREATININE 0.68 0.72  CALCIUM 9.1 9.0   ------------------------------------------------------------------------------------------------------------------ estimated creatinine clearance is 47.3 mL/min (by C-G formula based on SCr of 0.72 mg/dL). ------------------------------------------------------------------------------------------------------------------ No results for input(s): HGBA1C in the last 72 hours. ------------------------------------------------------------------------------------------------------------------ No results for input(s): CHOL, HDL, LDLCALC, TRIG, CHOLHDL, LDLDIRECT in the last 72 hours. ------------------------------------------------------------------------------------------------------------------ No results for input(s): TSH, T4TOTAL, T3FREE, THYROIDAB in the last 72 hours.  Invalid input(s):  FREET3 ------------------------------------------------------------------------------------------------------------------ No results for input(s): VITAMINB12, FOLATE, FERRITIN, TIBC, IRON, RETICCTPCT in the last 72 hours.  Coagulation profile No results for input(s): INR, PROTIME in the last 168 hours.  No results for input(s): DDIMER in the last 72 hours.  Cardiac Enzymes No results for input(s): CKMB, TROPONINI, MYOGLOBIN in the last 168 hours.  Invalid input(s): CK ------------------------------------------------------------------------------------------------------------------ Invalid input(s): POCBNP    Assessment & Plan   Patient is 85 year old status post fall   Closed left hip fracture (HCC) -patient medically cleared for surgery No cardiopulmonary work-up needed based on advanced age moderate risk for surgery  Diabetes (HCC) -sliding scale insulin coverage will hold glipizide and metformin for now  HTN (hypertension) -  hold lisinopril HCTZ for now  Hyperlipidemia resume Lipitor     Code Status Orders  (From admission, onward)         Start     Ordered   01/20/19 0728  Full code  Continuous     01/20/19 0727        Code Status History    This patient has a current code status but no historical code status.           Consults orthopedic  DVT Prophylaxis SCDs for now  Lab Results  Component Value Date   PLT 160 01/20/2019     Time Spent in minutes   Greater than 50% of time spent in care coordination and counseling patient regarding the condition and plan of care.   Auburn Bilberry M.D on 01/20/2019 at 2:27 PM  Between 7am to 6pm - Pager - [phone]  After 6pm go to [email or web address] - Social research officer, government  [employer]   Office  [phone]

## 2019-01-20 NOTE — Progress Notes (Signed)
15 minute call to floor. 

## 2019-01-20 NOTE — Op Note (Signed)
01/20/2019  5:32 PM  PATIENT:  Kim Roy  85 y.o. female  PRE-OPERATIVE DIAGNOSIS:  left hip fracture  POST-OPERATIVE DIAGNOSIS:  same  PROCEDURE:  Procedure(s): CANNULATED HIP PINNING (Left)  SURGEON: Leitha Schuller, MD  ASSISTANTS: None  ANESTHESIA:   spinal  EBL:  Total I/O In: 700 [I.V.:700] Out: 50 [Blood:50]  BLOOD ADMINISTERED:none  DRAINS: none   LOCAL MEDICATIONS USED:  NONE  SPECIMEN:  No Specimen  DISPOSITION OF SPECIMEN:  N/A  COUNTS:  YES  TOURNIQUET:  * No tourniquets in log *  IMPLANTS: Synthes 7.3 screws x3  DICTATION: .Dragon Dictation patient was brought to the operating room and after adequate spinal anesthesia was obtained she was transferred to the fracture table with the  right leg in the well-leg holder and the left foot in the traction boot with cement no traction applied just internal rotation.  C arm was brought in and good visualization showed no displacement of the prior femoral neck fracture.  The hip was prepped and draped using a barrier drape method and appropriate patient identification and timeout procedures were completed.  An incision was made along the lateral femur and subcutaneous tissue spread with the soft tissue protector placed guidewire was inserted this ended up being slightly superior and central 2 more guidewires were placed parallel to this anterior superior and anterior inferior these were measured drilled tapped and then the 7.3 long cannulated screws were placed without penetration into the joint.  Permanent C arm views were obtained in both AP and lateral projections the guidewires were removed and the wound irrigated and then closed with 2-0 Vicryl subcutaneously and skin staples.  Honeycomb dressing applied  PLAN OF CARE: Continue as inpatient  PATIENT DISPOSITION:  PACU - hemodynamically stable.

## 2019-01-20 NOTE — Plan of Care (Signed)
  Problem: Education: Goal: Knowledge of General Education information will improve Description Including pain rating scale, medication(s)/side effects and non-pharmacologic comfort measures Outcome: Progressing   

## 2019-01-20 NOTE — Anesthesia Procedure Notes (Signed)
Spinal  Patient location during procedure: OR Start time: 01/20/2019 4:42 PM End time: 01/20/2019 4:49 PM Staffing Anesthesiologist: Piscitello, Precious Haws, MD Performed: anesthesiologist  Preanesthetic Checklist Completed: patient identified, site marked, surgical consent, pre-op evaluation, timeout performed, IV checked, risks and benefits discussed and monitors and equipment checked Spinal Block Patient position: sitting Prep: ChloraPrep Patient monitoring: heart rate, continuous pulse ox, blood pressure and cardiac monitor Approach: midline Location: L3-4 Injection technique: single-shot Needle Needle type: Whitacre and Introducer  Needle gauge: 24 G Needle length: 9 cm Assessment Sensory level: T10 Additional Notes Negative paresthesia. Negative blood return. Positive free-flowing CSF. Expiration date of kit checked and confirmed. Patient tolerated procedure well, without complications.

## 2019-01-20 NOTE — ED Notes (Signed)
Kim Roy [phone] daughter in law updated on pt. Son is [phone] Tashena Lenard.

## 2019-01-20 NOTE — Anesthesia Preprocedure Evaluation (Addendum)
Anesthesia Evaluation  Patient identified by MRN, date of birth, ID band Patient awake    Reviewed: Allergy & Precautions, H&P , NPO status , Patient's Chart, lab work & pertinent test results  Airway Mallampati: III  TM Distance: >3 FB     Dental  (+) Chipped, Poor Dentition   Pulmonary neg pulmonary ROS, neg COPD,           Cardiovascular hypertension, (-) Past MI, (-) Cardiac Stents and (-) CABG (-) dysrhythmias      Neuro/Psych negative neurological ROS  negative psych ROS   GI/Hepatic negative GI ROS, Neg liver ROS,   Endo/Other  diabetes  Renal/GU   negative genitourinary   Musculoskeletal   Abdominal   Peds  Hematology negative hematology ROS (+)   Anesthesia Other Findings Past Medical History: No date: Diabetes mellitus without complication (HCC) No date: HTN (hypertension)  Past Surgical History: No date: FRACTURE SURGERY  BMI    Body Mass Index:  21.63 kg/m      Reproductive/Obstetrics negative OB ROS                            Anesthesia Physical Anesthesia Plan  ASA: II  Anesthesia Plan: Spinal   Post-op Pain Management:    Induction:   PONV Risk Score and Plan: Propofol infusion  Airway Management Planned: Natural Airway and Simple Face Mask  Additional Equipment:   Intra-op Plan:   Post-operative Plan:   Informed Consent: I have reviewed the patients History and Physical, chart, labs and discussed the procedure including the risks, benefits and alternatives for the proposed anesthesia with the patient or authorized representative who has indicated his/her understanding and acceptance.     Dental Advisory Given  Plan Discussed with: Anesthesiologist and CRNA  Anesthesia Plan Comments:         Anesthesia Quick Evaluation

## 2019-01-20 NOTE — ED Notes (Signed)
Report received from Jasper, RN, care of pt assumed at this time.  Pt resting quietly on ER stretcher, NAD noted, VSS, will continue to monitor.

## 2019-01-20 NOTE — ED Notes (Signed)
Purewick place and pt bedding changed. Vitals checked and beginning steps to prepare for surgery.   Lm edt

## 2019-01-21 ENCOUNTER — Encounter: Payer: Self-pay | Admitting: Orthopedic Surgery

## 2019-01-21 LAB — CBC
HCT: 37.5 % (ref 36.0–46.0)
Hemoglobin: 11.8 g/dL — ABNORMAL LOW (ref 12.0–15.0)
MCH: 27.2 pg (ref 26.0–34.0)
MCHC: 31.5 g/dL (ref 30.0–36.0)
MCV: 86.4 fL (ref 80.0–100.0)
Platelets: 158 10*3/uL (ref 150–400)
RBC: 4.34 MIL/uL (ref 3.87–5.11)
RDW: 14.6 % (ref 11.5–15.5)
WBC: 7.2 10*3/uL (ref 4.0–10.5)
nRBC: 0 % (ref 0.0–0.2)

## 2019-01-21 LAB — GLUCOSE, CAPILLARY
Glucose-Capillary: 136 mg/dL — ABNORMAL HIGH (ref 70–99)
Glucose-Capillary: 141 mg/dL — ABNORMAL HIGH (ref 70–99)
Glucose-Capillary: 153 mg/dL — ABNORMAL HIGH (ref 70–99)
Glucose-Capillary: 83 mg/dL (ref 70–99)

## 2019-01-21 LAB — BASIC METABOLIC PANEL
Anion gap: 9 (ref 5–15)
BUN: 17 mg/dL (ref 8–23)
CO2: 25 mmol/L (ref 22–32)
Calcium: 8.6 mg/dL — ABNORMAL LOW (ref 8.9–10.3)
Chloride: 104 mmol/L (ref 98–111)
Creatinine, Ser: 0.97 mg/dL (ref 0.44–1.00)
GFR calc Af Amer: >60 mL/min (ref >60)
GFR calc non Af Amer: 53 mL/min — ABNORMAL LOW (ref >60)
Glucose, Bld: 125 mg/dL — ABNORMAL HIGH (ref 70–99)
Potassium: 3.6 mmol/L (ref 3.5–5.1)
Sodium: 138 mmol/L (ref 135–145)

## 2019-01-21 NOTE — Progress Notes (Signed)
   Subjective: 1 Day Post-Op Procedure(s) (LRB): CANNULATED HIP PINNING (Left) Patient reports pain as mild.   Patient is well, and has had no acute complaints or problems Denies any CP, SOB, ABD pain. We will continue therapy today.   Objective: Vital signs in last 24 hours: Temp:  [97.5 F (36.4 C)-98.8 F (37.1 C)] 98.5 F (36.9 C) (06/01 0749) Pulse Rate:  [75-137] 75 (06/01 0749) Resp:  [13-18] 17 (06/01 0749) BP: (105-187)/(71-112) 121/71 (06/01 0749) SpO2:  [96 %-100 %] 97 % (06/01 0749)  Intake/Output from previous day: 05/31 0701 - 06/01 0700 In: 1144.8 [I.V.:1044.8; IV Piggyback:100] Out: 1025 [Urine:975; Blood:50] Intake/Output this shift: No intake/output data recorded.  Recent Labs    01/19/19 2331 01/20/19 0819 01/21/19 0334  HGB 12.5 12.1 11.8*   Recent Labs    01/20/19 0819 01/21/19 0334  WBC 5.0 7.2  RBC 4.43 4.34  HCT 39.1 37.5  PLT 160 158   Recent Labs    01/20/19 0819 01/21/19 0334  NA 140 138  K 3.9 3.6  CL 104 104  CO2 29 25  BUN 14 17  CREATININE 0.72 0.97  GLUCOSE 140* 125*  CALCIUM 9.0 8.6*   No results for input(s): LABPT, INR in the last 72 hours.  EXAM General - Patient is Alert, Appropriate and Oriented Extremity - Neurovascular intact Sensation intact distally Intact pulses distally Dorsiflexion/Plantar flexion intact No cellulitis present Compartment soft Dressing - dressing C/D/I and no drainage Motor Function - intact, moving foot and toes well on exam.   Past Medical History:  Diagnosis Date  . Diabetes mellitus without complication (HCC)   . HTN (hypertension)     Assessment/Plan:   1 Day Post-Op Procedure(s) (LRB): CANNULATED HIP PINNING (Left) Principal Problem:   Closed left hip fracture (HCC) Active Problems:   Diabetes (HCC)   HTN (hypertension)  Estimated body mass index is 21.63 kg/m as calculated from the following:   Height as of this encounter: 5\' 6"  (1.676 m).   Weight as of this  encounter: 60.8 kg. Advance diet Up with therapy  Needs BM Recheck labs in the am Pain well controlled VSS CM to assist with discharge  DVT Prophylaxis - Aspirin, TED hose and SCDs Weight-Bearing as tolerated to left leg   T. Cranston Neighbor, PA-C [facility] 01/21/2019, 7:58 AM

## 2019-01-21 NOTE — TOC Transition Note (Signed)
Transition of Care [facility]) - CM/SW Discharge Note   Patient Details  Name: Kim Roy MRN: [id number] Date of Birth: October 03, 1930  Transition of Care [facility]) CM/SW Contact:  Barrie Dunker, RN Phone Number: 01/21/2019, 4:13 PM   Clinical Narrative:     Patient lives at home, fell getting out of car, Sees the [facility] practice as PCP, Uses [employer]  Daughter provides transportation  [employer] and [employer] completed, bed search sent, Will review once bed offer obtained    Barriers to Discharge: Continued Medical Work up   Patient Goals and CMS Choice        Discharge Placement                       Discharge Plan and Services                                     Social Determinants of Health (SDOH) Interventions     Readmission Risk Interventions No flowsheet data found.

## 2019-01-21 NOTE — Anesthesia Postprocedure Evaluation (Signed)
Anesthesia Post Note  Patient: Kim Roy  Procedure(s) Performed: CANNULATED HIP PINNING (Left )  Patient location during evaluation: Nursing Unit Anesthesia Type: Spinal Level of consciousness: oriented and awake and alert Pain management: pain level controlled Vital Signs Assessment: post-procedure vital signs reviewed and stable Respiratory status: spontaneous breathing and respiratory function stable Cardiovascular status: blood pressure returned to baseline and stable Postop Assessment: no headache, no backache, no apparent nausea or vomiting and patient able to bend at knees Anesthetic complications: no     Last Vitals:  Vitals:   01/21/19 0356 01/21/19 0749  BP: (!) 145/98 121/71  Pulse: 91 75  Resp: 16 17  Temp: 37.1 C 36.9 C  SpO2: 96% 97%    Last Pain:  Vitals:   01/21/19 0749  TempSrc: Oral  PainSc:                  Starling Manns

## 2019-01-21 NOTE — Progress Notes (Signed)
[employer] - [place] at [facility]                                                                                                                                                                                  Patient Demographics   Kim Roy, is a 85 y.o. female, DOB - 01/24/1931, MLJ:[id number]  Admit date - 01/19/2019   Admitting Physician Oralia Manis, MD  Outpatient Primary MD for the patient is [facility]   LOS - 2  Subjective: Patient more alert today denies any complaints  Review of Systems:    CONSTITUTIONAL: No documented fever. No fatigue, weakness. No weight gain, no weight loss.  EYES: No blurry or double vision.  ENT: No tinnitus. No postnasal drip. No redness of the oropharynx.  RESPIRATORY: No cough, no wheeze, no hemoptysis. No dyspnea.  CARDIOVASCULAR: No chest pain. No orthopnea. No palpitations. No syncope.  GASTROINTESTINAL: No nausea, no vomiting or diarrhea. No abdominal pain. No melena or hematochezia.  GENITOURINARY:  No urgency. No frequency. No dysuria. No hematuria. No obstructive symptoms. No discharge. No pain. No significant abnormal bleeding ENDOCRINE: No polyuria or nocturia. No heat or cold intolerance.  HEMATOLOGY: No anemia. No bruising. No bleeding. No purpura. No petechiae INTEGUMENTARY: No rashes. No lesions.  MUSCULOSKELETAL: No arthritis. No swelling. No gout.  NEUROLOGIC: No numbness, tingling, or ataxia. No seizure-type activity.  PSYCHIATRIC: No anxiety. No insomnia. No ADD.       Vitals:   Vitals:   01/21/19 0356 01/21/19 0749 01/21/19 0949 01/21/19 1200  BP: (!) 145/98 121/71 115/66 (!) 143/69  Pulse: 91 75  70  Resp: 16 17  17   Temp: 98.8 F (37.1 C) 98.5 F (36.9 C)  97.6 F (36.4 C)  TempSrc: Oral Oral  Oral  SpO2: 96% 97%  98%  Weight:      Height:        Wt Readings from Last 3 Encounters:  01/19/19 60.8 kg  12/19/16 69.9 kg     Intake/Output Summary (Last 24  hours) at 01/21/2019 1208 Last data filed at 01/21/2019 0356 Gross per 24 hour  Intake 1144.75 ml  Output 1025 ml  Net 119.75 ml    Physical Exam:   GENERAL: Pleasant-appearing in no apparent distress.  HEAD, EYES, EARS, NOSE AND THROAT: Atraumatic, normocephalic. Extraocular muscles are intact. Pupils equal and reactive to light. Sclerae anicteric. No conjunctival injection. No oro-pharyngeal erythema.  NECK: Supple. There is no jugular venous distention. No bruits, no lymphadenopathy, no thyromegaly.  HEART: Regular rate and rhythm,. No murmurs, no rubs, no clicks.  LUNGS: Clear to auscultation bilaterally. No rales or rhonchi. No wheezes.  ABDOMEN: Soft, flat, nontender, nondistended. Has good bowel sounds. No hepatosplenomegaly appreciated.  EXTREMITIES: No evidence of any cyanosis, clubbing, or peripheral edema.  +2 pedal and radial pulses bilaterally.  NEUROLOGIC: The patient is alert, awake,  with no focal motor or sensory deficits appreciated bilaterally.  SKIN: Moist and warm with no rashes appreciated.  Psych: Not anxious, depressed LN: No inguinal LN enlargement    Antibiotics   Anti-infectives (From admission, onward)   Start     Dose/Rate Route Frequency Ordered Stop   01/20/19 2300  ceFAZolin (ANCEF) IVPB 2g/100 mL premix     2 g 200 mL/hr over 30 Minutes Intravenous Every 6 hours 01/20/19 1821 01/21/19 1207   01/20/19 0700  ceFAZolin (ANCEF) IVPB 1 g/50 mL premix     1 g 100 mL/hr over 30 Minutes Intravenous  Once 01/20/19 0645 01/20/19 0900      Medications   Scheduled Meds: . amLODipine  10 mg Oral Daily  . aspirin EC  325 mg Oral Q breakfast  . atorvastatin  40 mg Oral q1800  . carvedilol  12.5 mg Oral BID  . docusate sodium  100 mg Oral BID  . glipiZIDE  10 mg Oral BID  . hydrochlorothiazide  25 mg Oral Daily  . insulin aspart  0-9 Units Subcutaneous Q6H  . lisinopril  20 mg Oral Daily  . loratadine  10 mg Oral Daily  . metFORMIN  1,000 mg Oral Daily   . pneumococcal 23 valent vaccine  0.5 mL Intramuscular Tomorrow-1000   Continuous Infusions: . sodium chloride 75 mL/hr at 01/21/19 0300  . methocarbamol (ROBAXIN) IV     PRN Meds:.acetaminophen **OR** acetaminophen, alum & mag hydroxide-simeth, bisacodyl, HYDROcodone-acetaminophen, magnesium citrate, magnesium hydroxide, menthol-cetylpyridinium **OR** phenol, methocarbamol **OR** methocarbamol (ROBAXIN) IV, metoCLOPramide **OR** metoCLOPramide (REGLAN) injection, morphine injection, ondansetron **OR** ondansetron (ZOFRAN) IV, oxyCODONE, zolpidem   Data Review:   Micro Results Recent Results (from the past 240 hour(s))  SARS Coronavirus 2 (CEPHEID - Performed in [facility] hospital lab), Hosp Order     Status: None   Collection Time: 01/19/19 10:59 PM  Result Value Ref Range Status   SARS Coronavirus 2 NEGATIVE NEGATIVE Final    Comment: (NOTE) If result is NEGATIVE SARS-CoV-2 target nucleic acids are NOT DETECTED. The SARS-CoV-2 RNA is generally detectable in upper and lower  respiratory specimens during the acute phase of infection. The lowest  concentration of SARS-CoV-2 viral copies this assay can detect is 250  copies / mL. A negative result does not preclude SARS-CoV-2 infection  and should not be used as the sole basis for treatment or other  patient management decisions.  A negative result may occur with  improper specimen collection / handling, submission of specimen other  than nasopharyngeal swab, presence of viral mutation(s) within the  areas targeted by this assay, and inadequate number of viral copies  (<250 copies / mL). A negative result must be combined with clinical  observations, patient history, and epidemiological information. If result is POSITIVE SARS-CoV-2 target nucleic acids are DETECTED. The SARS-CoV-2 RNA is generally detectable in upper and lower  respiratory specimens dur ing the acute phase of infection.  Positive  results are indicative of  active infection with SARS-CoV-2.  Clinical  correlation with patient history and other diagnostic information is  necessary to determine patient infection status.  Positive results do  not rule out bacterial infection or co-infection with other viruses. If result is PRESUMPTIVE POSTIVE SARS-CoV-2 nucleic acids MAY BE PRESENT.  A presumptive positive result was obtained on the submitted specimen  and confirmed on repeat testing.  While 2019 novel coronavirus  (SARS-CoV-2) nucleic acids may be present in the submitted sample  additional confirmatory testing may be necessary for epidemiological  and / or clinical management purposes  to differentiate between  SARS-CoV-2 and other Sarbecovirus currently known to infect humans.  If clinically indicated additional testing with an alternate test  methodology [phone]) is advised. The SARS-CoV-2 RNA is generally  detectable in upper and lower respiratory sp ecimens during the acute  phase of infection. The expected result is Negative. Fact Sheet for Patients:  BoilerBrush.com.cy Fact Sheet for Healthcare Providers: https://pope.com/ This test is not yet approved or cleared by the [place] FDA and has been authorized for detection and/or diagnosis of SARS-CoV-2 by FDA under an Emergency Use Authorization (EUA).  This EUA will remain in effect (meaning this test can be used) for the duration of the COVID-19 declaration under Section 564(b)(1) of the Act, 21 U.S.C. section 360bbb-3(b)(1), unless the authorization is terminated or revoked sooner. Performed at [facility] [place], [address], [place], [place] [address]     Radiology Reports Dg Chest 1 View  Result Date: 01/19/2019 CLINICAL DATA:  Preop hip fracture EXAM: CHEST  1 VIEW COMPARISON:  None. FINDINGS: Cardiomegaly. Interstitial prominence within the lungs, favor chronic lung disease. Lingular scarring. No effusions or  acute bony abnormality. IMPRESSION: Cardiomegaly. Interstitial prominence, likely chronic interstitial lung disease. Lingular scarring. Electronically Signed   By: Charlett Nose M.D.   On: 01/19/2019 22:12   Dg Pelvis 1-2 Views  Result Date: 01/19/2019 CLINICAL DATA:  Fall EXAM: PELVIS - 1-2 VIEW COMPARISON:  None. FINDINGS: There is a left femoral neck fracture. No angulation. No subluxation or dislocation. Screws within the right proximal femur related to remote injury. IMPRESSION: Nondisplaced left femoral neck fracture. Electronically Signed   By: Charlett Nose M.D.   On: 01/19/2019 21:38   Dg Ankle Complete Left  Result Date: 01/19/2019 CLINICAL DATA:  Fall EXAM: LEFT ANKLE COMPLETE - 3+ VIEW COMPARISON:  None. FINDINGS: No fracture or malalignment. Ankle mortise is symmetric. Diffuse soft tissue swelling. Moderate plantar calcaneal spur IMPRESSION: No acute osseous abnormality Electronically Signed   By: Jasmine Pang M.D.   On: 01/19/2019 21:40   Ct Hip Left Wo Contrast  Result Date: 01/19/2019 CLINICAL DATA:  Fall.  Concern for femoral neck fracture on x-rays EXAM: CT OF THE LEFT HIP WITHOUT CONTRAST TECHNIQUE: Multidetector CT imaging of the left hip was performed according to the standard protocol. Multiplanar CT image reconstructions were also generated. COMPARISON:  Left hip plain films 01/19/2019 FINDINGS: There is a nondisplaced left femoral neck fracture, best seen on coronal reconstructed images. No angulation. No subluxation or dislocation. IMPRESSION: Nondisplaced left femoral neck fracture. Electronically Signed   By: Charlett Nose M.D.   On: 01/19/2019 23:41   Dg Knee Complete 4 Views Left  Result Date: 01/19/2019 CLINICAL DATA:  Fall EXAM: LEFT KNEE - COMPLETE 4+ VIEW COMPARISON:  None. FINDINGS: Degenerative changes diffusely throughout the left knee. No acute bony abnormality. Specifically, no fracture, subluxation, or dislocation. No joint effusion. Vascular calcification.  IMPRESSION: Tricompartment degenerative changes.  No acute bony abnormality. Electronically Signed   By: Charlett Nose M.D.   On: 01/19/2019 21:38   Dg Hip Operative Unilat With Pelvis Left  Result Date: 01/20/2019 CLINICAL DATA:  Intraoperative imaging for fixation of a subcapital left hip fracture the patient suffered in a  fall 01/19/2019. Initial encounter. EXAM: OPERATIVE RIGHT HIP (WITH PELVIS IF PERFORMED) 2 VIEWS TECHNIQUE: Fluoroscopic spot image(s) were submitted for interpretation post-operatively. COMPARISON:  CT left hip 01/19/2019. FINDINGS: Fluoroscopic spot views demonstrate placement of 3 screws for fixation of a left subcapital fracture. Hardware is appropriately positioned. Position and alignment are anatomic. IMPRESSION: Intraoperative imaging for fixation of a left subcapital fracture. Electronically Signed   By: Thomas  Dalessio M.D.   On: 01/20/2019 17:38   Dg Hip Unilat With Pelvis 2-3 Views Left  Result Date: 01/19/2019 CLINICAL DATA:  Fall with left hip pain EXAM: DG HIP (WITH OR WITHOUT PELVIS) 2-3V LEFT COMPARISON:  12/19/2016 FINDINGS: Suspect a subcapital femoral neck fracture with slight posterior impaction seen on the cross-table lateral. Femoral neck is foreshortened on the AP view. No dislocation. IMPRESSION: Suspect a nondisplaced subcapital femoral neck fracture. Recommend CT for confirmation Electronically Signed   By: Jonathon  Watts M.D.   On: 01/19/2019 22:11     CBC Recent Labs  Lab 01/19/19 2331 01/20/19 0819 01/21/19 0334  WBC 5.1 5.0 7.2  HGB 12.5 12.1 11.8*  HCT 39.4 39.1 37.5  PLT 164 160 158  MCV 87.2 88.3 86.4  MCH 27.7 27.3 27.2  MCHC 31.7 30.9 31.5  RDW 14.8 14.7 14.6    Chemistries  Recent Labs  Lab 01/19/19 2331 01/20/19 0819 01/21/19 0334  NA 139 140 138  K 3.6 3.9 3.6  CL 101 104 104  CO2 28 29 25   GLUCOSE 155* 140* 125*  BUN 13 14 17   CREATININE 0.68 0.72 0.97  CALCIUM 9.1 9.0 8.6*    ------------------------------------------------------------------------------------------------------------------ estimated creatinine clearance is 39 mL/min (by C-G formula based on SCr of 0.97 mg/dL). ------------------------------------------------------------------------------------------------------------------ No results for input(s): HGBA1C in the last 72 hours. ------------------------------------------------------------------------------------------------------------------ No results for input(s): CHOL, HDL, LDLCALC, TRIG, CHOLHDL, LDLDIRECT in the last 72 hours. ------------------------------------------------------------------------------------------------------------------ No results for input(s): TSH, T4TOTAL, T3FREE, THYROIDAB in the last 72 hours.  Invalid input(s): FREET3 ------------------------------------------------------------------------------------------------------------------ No results for input(s): VITAMINB12, FOLATE, FERRITIN, TIBC, IRON, RETICCTPCT in the last 72 hours.  Coagulation profile No results for input(s): INR, PROTIME in the last 168 hours.  No results for input(s): DDIMER in the last 72 hours.  Cardiac Enzymes No results for input(s): CKMB, TROPONINI, MYOGLOBIN in the last 168 hours.  Invalid input(s): CK ------------------------------------------------------------------------------------------------------------------ Invalid input(s): POCBNP    Assessment & Plan   Patient is 85 year old status post fall   Closed left hip fracture (HCC) -status post repair, PT evaluation done  Diabetes (HCC) continue sliding-scale resume metformin  HTN (hypertension) -resume lisinopril HCTZ  Hyperlipidemia continue Lipitor     Code Status Orders  (From admission, onward)         Start     Ordered   01/20/19 0728  Full code  Continuous     01/20/19 0727        Code Status History    This patient has a current code status but no  historical code status.           Consults orthopedic  DVT Prophylaxis SCDs for now  Lab Results  Component Value Date   PLT 158 01/21/2019     Time Spent in minutes   <MEASUREMENT [place] than 50% of time spent in care coordination and counseling patient regarding the condition and plan of care.   August Longest M.D on 01/21/2019 at 12:08 PM  Between 7am to 6pm - Pager - [phone]  After 6pm go to [email or web address] - password [email or web address]  [place]  [phone]

## 2019-01-21 NOTE — Evaluation (Signed)
Physical Therapy Evaluation Patient Details Name: Kim SierrasHattie Evans Mccarver MRN: [id number] DOB: 10/05/1930 Today's Date: 01/21/2019   History of Present Illness  Pt admitted for L hip fx s/p hip pinning 5/31 after she fell getting out of a car. History includes DM and HTN.  Clinical Impression  Pt is a pleasant 85 year old female who was admitted for L hip pinning. Pt performs bed mobility with max assist, transfers with mod assist, and attempted ambulation with max assist +2 and rw. Pt needs heavy cues for following commands and sequencing. Pt is very fatigued upon arrival, poor historian. Pt demonstrates deficits with strength/mobility/cognition. Would benefit from skilled PT to address above deficits and promote optimal return to PLOF; recommend transition to STR upon discharge from acute hospitalization.       Follow Up Recommendations SNF    Equipment Recommendations  None recommended by PT    Recommendations for Other Services       Precautions / Restrictions Precautions Precautions: Fall Restrictions Weight Bearing Restrictions: Yes LLE Weight Bearing: Weight bearing as tolerated      Mobility  Bed Mobility Overal bed mobility: Needs Assistance Bed Mobility: Supine to Sit     Supine to sit: Max assist;+2 for physical assistance     General bed mobility comments: increased cues for following commands. Needs cues for hand placement and sliding B LEs off bed. Once seated, able to sit with cga  Transfers Overall transfer level: Needs assistance Equipment used: Rolling walker (2 wheeled) Transfers: Sit to/from Stand Sit to Stand: Mod assist;+2 physical assistance         General transfer comment: forward flexed posture. Poor WBing on surgical LE. Does follow commands for upright posture, however only able to maintain briefly.  Ambulation/Gait Ambulation/Gait assistance: Max assist;+2 physical assistance Gait Distance (Feet): 1 Feet Assistive device: Rolling walker  (2 wheeled)       General Gait Details: attempted gait, however unable to take steps over to chair. Tries to sit down prior to instruction. Able to stand during chuck change.  Stairs            Wheelchair Mobility    Modified Rankin (Stroke Patients Only)       Balance Overall balance assessment: Needs assistance;History of Falls Sitting-balance support: Feet supported;Single extremity supported Sitting balance-Leahy Scale: Fair     Standing balance support: Bilateral upper extremity supported Standing balance-Leahy Scale: Poor                               Pertinent Vitals/Pain Pain Assessment: No/denies pain    Home Living Family/patient expects to be discharged to:: Private residence Living Arrangements: Children(son) Available Help at Discharge: Family Type of Home: House Home Access: Level entry     Home Layout: One level Home Equipment: Environmental consultantWalker - 2 wheels Additional Comments: poor historian due to lethargy, unsure of accurate history.     Prior Function Level of Independence: Independent with assistive device(s)         Comments: ambulates around home with RW. + fall history     Hand Dominance        Extremity/Trunk Assessment   Upper Extremity Assessment Upper Extremity Assessment: Generalized weakness(B UE grossly 3+/5)    Lower Extremity Assessment Lower Extremity Assessment: Generalized weakness(L LE grossly 3/5; R LE grossly 3+/5)       Communication   Communication: No difficulties  Cognition Arousal/Alertness: Lethargic Behavior During Therapy: WFL for  tasks assessed/performed Overall Cognitive Status: Difficult to assess                                        General Comments      Exercises Other Exercises Other Exercises: supine ther-ex performed including ankle pumps, SLRs, and hip abd/add. Ther-ex performed on B LE x 10 reps with max assist on L LE and min assist on R LE. Other Exercises:  Upon sitting, pt incontinent, prolonged standing attempt for chuck pad exchange.   Assessment/Plan    PT Assessment Patient needs continued PT services  PT Problem List Decreased strength;Decreased activity tolerance;Decreased balance;Decreased mobility;Decreased knowledge of use of DME       PT Treatment Interventions DME instruction;Gait training;Therapeutic exercise;Balance training    PT Goals (Current goals can be found in the Care Plan section)  Acute Rehab PT Goals Patient Stated Goal: to walk again PT Goal Formulation: With patient Time For Goal Achievement: 02/04/19 Potential to Achieve Goals: Good    Frequency BID   Barriers to discharge        Co-evaluation               AM-PAC PT "6 Clicks" Mobility  Outcome Measure Help needed turning from your back to your side while in a flat bed without using bedrails?: Total Help needed moving from lying on your back to sitting on the side of a flat bed without using bedrails?: Total Help needed moving to and from a bed to a chair (including a wheelchair)?: Total Help needed standing up from a chair using your arms (e.g., wheelchair or bedside chair)?: A Lot Help needed to walk in hospital room?: Total Help needed climbing 3-5 steps with a railing? : Total 6 Click Score: 7    End of Session Equipment Utilized During Treatment: Gait belt Activity Tolerance: Patient limited by fatigue Patient left: in bed;with bed alarm set;with SCD's reapplied Nurse Communication: Mobility status PT Visit Diagnosis: Muscle weakness (generalized) (M62.81);History of falling (Z91.81);Difficulty in walking, not elsewhere classified (R26.2)    Time: 6754-4920 PT Time Calculation (min) (ACUTE ONLY): 20 min   Charges:   PT Evaluation $PT Eval Moderate Complexity: 1 Mod PT Treatments $Therapeutic Exercise: 8-22 mins        Elizabeth Palau, PT, DPT [phone]   Jahliyah Trice 01/21/2019, 10:43 AM

## 2019-01-21 NOTE — Progress Notes (Signed)
Physical Therapy Treatment Patient Details Name: Kemaya Bedward MRN: [id number] DOB: 11-14-1930 Today's Date: 01/21/2019    History of Present Illness Pt admitted for L hip fx s/p hip pinning 5/31 after she fell getting out of a car. History includes DM and HTN.    PT Comments    Pt in bed, falling asleep.  She awoke and agreed to supine exercises but declined further out of bed activity.  Participated in exercises as described below.  Pt more engaged in session this pm and able to participate in exercises.   Follow Up Recommendations  SNF     Equipment Recommendations  None recommended by PT    Recommendations for Other Services       Precautions / Restrictions Precautions Precautions: Fall Restrictions Weight Bearing Restrictions: Yes LLE Weight Bearing: Weight bearing as tolerated    Mobility  Bed Mobility               General bed mobility comments: deferred this pm due to fatigue  Transfers                    Ambulation/Gait                 Stairs             Wheelchair Mobility    Modified Rankin (Stroke Patients Only)       Balance                                            Cognition Arousal/Alertness: Awake/alert Behavior During Therapy: WFL for tasks assessed/performed Overall Cognitive Status: Difficult to assess                                        Exercises Other Exercises Other Exercises: supine ther-ex performed including ankle pumps, SLRs, and hip abd/add. Ther-ex performed on B LE x 10 reps with min assist on R and L LE.    General Comments        Pertinent Vitals/Pain Pain Assessment: No/denies pain    Home Living                      Prior Function            PT Goals (current goals can now be found in the care plan section) Progress towards PT goals: Progressing toward goals    Frequency    BID      PT Plan Current plan remains  appropriate    Co-evaluation              AM-PAC PT "6 Clicks" Mobility   Outcome Measure  Help needed turning from your back to your side while in a flat bed without using bedrails?: Total Help needed moving from lying on your back to sitting on the side of a flat bed without using bedrails?: Total Help needed moving to and from a bed to a chair (including a wheelchair)?: Total Help needed standing up from a chair using your arms (e.g., wheelchair or bedside chair)?: A Lot Help needed to walk in hospital room?: Total Help needed climbing 3-5 steps with a railing? : Total 6 Click Score: 7    End of Session Equipment Utilized During Treatment: Gait  belt Activity Tolerance: Patient limited by fatigue Patient left: in bed;with call bell/phone within reach;with bed alarm set Nurse Communication: Mobility status       Time: [id number] PT Time Calculation (min) (ACUTE ONLY): 9 min  Charges:  $Therapeutic Exercise: 8-22 mins                     Danielle DessSarah Channon Ambrosini, PTA 01/21/19, 3:46 PM

## 2019-01-21 NOTE — [place] FL2 (Signed)
[place] MEDICAID FL2 LEVEL OF CARE SCREENING TOOL     IDENTIFICATION  Patient Name: Kim Roy Birthdate: 01-02-1931 Sex: female Admission Date (Current Location): 01/19/2019  [place] and [place] Number:  Chiropodist and Address:  [facility], [address], [place], [place] [address]      Provider Number: [id number]  Attending Physician Name and Address:  Auburn Bilberry, MD  Relative Name and Phone Number:  Marylene Land [phone]    Current Level of Care: Hospital Recommended Level of Care: Skilled Nursing Facility Prior Approval Number:    Date Approved/Denied: 01/11/12 PASRR Number: [id number]  Discharge Plan: SNF    Current Diagnoses: Patient Active Problem List   Diagnosis Date Noted  . Closed left hip fracture (HCC) 01/19/2019  . Diabetes (HCC) 01/19/2019  . HTN (hypertension) 01/19/2019    Orientation RESPIRATION BLADDER Height & Weight     Self, Time, Situation, Place  Normal Continent Weight: 60.8 kg Height:  5\' 6"  (167.6 cm)  BEHAVIORAL SYMPTOMS/MOOD NEUROLOGICAL BOWEL NUTRITION STATUS      Continent Diet(Carb Modified)  AMBULATORY STATUS COMMUNICATION OF NEEDS Skin   Extensive Assist Verbally Normal, Surgical wounds                       Personal Care Assistance Level of Assistance  Bathing, Dressing Bathing Assistance: Limited assistance Feeding assistance: Limited assistance Dressing Assistance: Limited assistance     Functional Limitations Info  Sight, Hearing, Speech Sight Info: Adequate Hearing Info: Adequate Speech Info: Adequate    SPECIAL CARE FACTORS FREQUENCY  PT (By licensed PT)     PT Frequency: 5 times per week              Contractures Contractures Info: Not present    Additional Factors Info  Code Status(full) Code Status Info: Full code             Current Medications (01/21/2019):  This is the current hospital active medication list Current  Facility-Administered Medications  Medication Dose Route Frequency Provider Last Rate Last Dose  . 0.9 %  sodium chloride infusion   Intravenous Continuous Kennedy Bucker, MD   Stopped at 01/21/19 1257  . acetaminophen (TYLENOL) tablet 650 mg  650 mg Oral Q6H PRN Kennedy Bucker, MD       Or  . acetaminophen (TYLENOL) suppository 650 mg  650 mg Rectal Q6H PRN Kennedy Bucker, MD      . alum & mag hydroxide-simeth (MAALOX/MYLANTA) 200-200-20 MG/5ML suspension 30 mL  30 mL Oral Q4H PRN Kennedy Bucker, MD      . amLODipine (NORVASC) tablet 10 mg  10 mg Oral Daily Kennedy Bucker, MD   10 mg at 01/21/19 0949  . aspirin EC tablet 325 mg  325 mg Oral Q breakfast Kennedy Bucker, MD   325 mg at 01/21/19 0830  . atorvastatin (LIPITOR) tablet 40 mg  40 mg Oral q1800 Kennedy Bucker, MD      . bisacodyl (DULCOLAX) suppository 10 mg  10 mg Rectal Daily PRN Kennedy Bucker, MD      . carvedilol (COREG) tablet 12.5 mg  12.5 mg Oral BID Kennedy Bucker, MD   12.5 mg at 01/21/19 0830  . docusate sodium (COLACE) capsule 100 mg  100 mg Oral BID Kennedy Bucker, MD   100 mg at 01/21/19 0949  . glipiZIDE (GLUCOTROL) tablet 10 mg  10 mg Oral BID Kennedy Bucker, MD   10 mg at 01/21/19 0853  . HYDROcodone-acetaminophen (  NORCO/VICODIN) 5-325 MG per tablet 1 tablet  1 tablet Oral Q4H PRN Kennedy BuckerMenz, Michael, MD   1 tablet at 01/21/19 [phone]  . insulin aspart (novoLOG) injection 0-9 Units  0-9 Units Subcutaneous Q6H Kennedy BuckerMenz, Michael, MD   2 Units at 01/21/19 1208  . lisinopril (ZESTRIL) tablet 20 mg  20 mg Oral Daily Auburn BilberryPatel, Shreyang, MD   20 mg at 01/20/19 2026  . loratadine (CLARITIN) tablet 10 mg  10 mg Oral Daily Kennedy BuckerMenz, Michael, MD   10 mg at 01/21/19 0949  . magnesium citrate solution 1 Bottle  1 Bottle Oral Once PRN Kennedy BuckerMenz, Michael, MD      . magnesium hydroxide (MILK OF MAGNESIA) suspension 30 mL  30 mL Oral Daily PRN Kennedy BuckerMenz, Michael, MD      . menthol-cetylpyridinium (CEPACOL) lozenge 3 mg  1 lozenge Oral PRN Kennedy BuckerMenz, Michael, MD       Or  . phenol  (CHLORASEPTIC) mouth spray 1 spray  1 spray Mouth/Throat PRN Kennedy BuckerMenz, Michael, MD      . metFORMIN (GLUCOPHAGE) tablet 1,000 mg  1,000 mg Oral Daily Kennedy BuckerMenz, Michael, MD   1,000 mg at 01/21/19 0949  . methocarbamol (ROBAXIN) tablet 500 mg  500 mg Oral Q6H PRN Kennedy BuckerMenz, Michael, MD       Or  . methocarbamol (ROBAXIN) 500 mg in dextrose 5 % 50 mL IVPB  500 mg Intravenous Q6H PRN Kennedy BuckerMenz, Michael, MD      . metoCLOPramide (REGLAN) tablet 5 mg  5 mg Oral Q8H PRN Kennedy BuckerMenz, Michael, MD       Or  . metoCLOPramide (REGLAN) injection 5 mg  5 mg Intravenous Q8H PRN Kennedy BuckerMenz, Michael, MD      . morphine 2 MG/ML injection 0.5 mg  0.5 mg Intravenous Q2H PRN Kennedy BuckerMenz, Michael, MD      . ondansetron [facility](ZOFRAN) tablet 4 mg  4 mg Oral Q6H PRN Kennedy BuckerMenz, Michael, MD       Or  . ondansetron [facility](ZOFRAN) injection 4 mg  4 mg Intravenous Q6H PRN Kennedy BuckerMenz, Michael, MD      . oxyCODONE (Oxy IR/ROXICODONE) immediate release tablet 5 mg  5 mg Oral Q4H PRN Kennedy BuckerMenz, Michael, MD   5 mg at 01/21/19 0341  . pneumococcal 23 valent vaccine (PNU-IMMUNE) injection 0.5 mL  0.5 mL Intramuscular Tomorrow-1000 Auburn BilberryPatel, Shreyang, MD      . zolpidem (AMBIEN) tablet 5 mg  5 mg Oral QHS PRN Kennedy BuckerMenz, Michael, MD         Discharge Medications: Please see discharge summary for a list of discharge medications.  Relevant Imaging Results:  Relevant Lab Results:   Additional Information [id number]  Barrie Dunkereliliah J Lasheka Kempner, RN

## 2019-01-22 LAB — CBC
HCT: 34.9 % — ABNORMAL LOW (ref 36.0–46.0)
Hemoglobin: 11 g/dL — ABNORMAL LOW (ref 12.0–15.0)
MCH: 27.2 pg (ref 26.0–34.0)
MCHC: 31.5 g/dL (ref 30.0–36.0)
MCV: 86.4 fL (ref 80.0–100.0)
Platelets: 164 10*3/uL (ref 150–400)
RBC: 4.04 MIL/uL (ref 3.87–5.11)
RDW: 14.6 % (ref 11.5–15.5)
WBC: 4.8 10*3/uL (ref 4.0–10.5)
nRBC: 0 % (ref 0.0–0.2)

## 2019-01-22 LAB — BASIC METABOLIC PANEL
Anion gap: 10 (ref 5–15)
BUN: 24 mg/dL — ABNORMAL HIGH (ref 8–23)
CO2: 24 mmol/L (ref 22–32)
Calcium: 8.4 mg/dL — ABNORMAL LOW (ref 8.9–10.3)
Chloride: 98 mmol/L (ref 98–111)
Creatinine, Ser: 0.82 mg/dL (ref 0.44–1.00)
GFR calc Af Amer: >60 mL/min (ref >60)
GFR calc non Af Amer: >60 mL/min (ref >60)
Glucose, Bld: 134 mg/dL — ABNORMAL HIGH (ref 70–99)
Potassium: 3.8 mmol/L (ref 3.5–5.1)
Sodium: 132 mmol/L — ABNORMAL LOW (ref 135–145)

## 2019-01-22 LAB — GLUCOSE, CAPILLARY
Glucose-Capillary: 107 mg/dL — ABNORMAL HIGH (ref 70–99)
Glucose-Capillary: 119 mg/dL — ABNORMAL HIGH (ref 70–99)
Glucose-Capillary: 142 mg/dL — ABNORMAL HIGH (ref 70–99)
Glucose-Capillary: 186 mg/dL — ABNORMAL HIGH (ref 70–99)
Glucose-Capillary: 58 mg/dL — ABNORMAL LOW (ref 70–99)
Glucose-Capillary: 97 mg/dL (ref 70–99)

## 2019-01-22 MED ORDER — GLIPIZIDE 10 MG PO TABS
10.0000 mg | ORAL_TABLET | Freq: Every day | ORAL | Status: DC
  Administered 2019-01-23: 10 mg via ORAL
  Filled 2019-01-22: qty 1

## 2019-01-22 MED ORDER — HYDROCODONE-ACETAMINOPHEN 5-325 MG PO TABS: 1.0000 | ORAL_TABLET | ORAL | 0 refills | Status: DC | PRN

## 2019-01-22 MED ORDER — ASPIRIN 325 MG PO TBEC: 325.0000 mg | DELAYED_RELEASE_TABLET | Freq: Every day | ORAL | 0 refills | Status: DC

## 2019-01-22 NOTE — Progress Notes (Signed)
Physical Therapy Treatment Patient Details Name: Kim Roy MRN: [id number] DOB: Nov 18, 1930 Today's Date: 01/22/2019    History of Present Illness Pt admitted for L hip fx s/p hip pinning 5/31 after she fell getting out of a car. History includes DM and HTN.    PT Comments    Pt is making good progress towards goals with increased mobility performed this date. Able to ambulate to recliner, still requires +2 assist. More alert and reports no pain. Good endurance with there-ex, however still presents with increased weakness noted on B LEs. Will continue to progress.   Follow Up Recommendations  SNF     Equipment Recommendations  None recommended by PT    Recommendations for Other Services       Precautions / Restrictions Precautions Precautions: Fall Restrictions Weight Bearing Restrictions: Yes LLE Weight Bearing: Weight bearing as tolerated    Mobility  Bed Mobility Overal bed mobility: Needs Assistance Bed Mobility: Supine to Sit     Supine to sit: Mod assist;+2 for physical assistance     General bed mobility comments: able to initiate movement of legs off side of bed. Needs assist for trunk stability. Once seated at EOB, able to sit with cga.  Transfers Overall transfer level: Needs assistance Equipment used: Rolling walker (2 wheeled) Transfers: Sit to/from Stand Sit to Stand: Mod assist;+2 physical assistance         General transfer comment: forward flexed posture, however able to correct with cues, only able to maintain for short time. Guards away from surgical leg. Fatigues quickly with trying to sit prior to therapist instruction  Ambulation/Gait Ambulation/Gait assistance: Mod assist;+2 physical assistance Gait Distance (Feet): 3 Feet Assistive device: Rolling walker (2 wheeled) Gait Pattern/deviations: Step-to pattern     General Gait Details: ambulated to recliner, short step to gait pattern. Needs physical assist to step with B feet.  Quick fatigue   Stairs             Wheelchair Mobility    Modified Rankin (Stroke Patients Only)       Balance Overall balance assessment: Needs assistance;History of Falls Sitting-balance support: Feet supported;Single extremity supported Sitting balance-Leahy Scale: Good     Standing balance support: Bilateral upper extremity supported Standing balance-Leahy Scale: Poor                              Cognition Arousal/Alertness: Awake/alert Behavior During Therapy: WFL for tasks assessed/performed Overall Cognitive Status: Difficult to assess                                        Exercises Other Exercises Other Exercises: supine ther-ex performed including ankle pumps, SLRs, SAQ, and hip abd/add. Ther-ex performed on B LE x 12 reps with min assist on R and L LE.    General Comments        Pertinent Vitals/Pain Pain Assessment: No/denies pain    Home Living                      Prior Function            PT Goals (current goals can now be found in the care plan section) Acute Rehab PT Goals Patient Stated Goal: to walk again PT Goal Formulation: With patient Time For Goal Achievement: 02/04/19 Potential to Achieve Goals: Good  Progress towards PT goals: Progressing toward goals    Frequency    BID      PT Plan Current plan remains appropriate    Co-evaluation              AM-PAC PT "6 Clicks" Mobility   Outcome Measure  Help needed turning from your back to your side while in a flat bed without using bedrails?: A Lot Help needed moving from lying on your back to sitting on the side of a flat bed without using bedrails?: A Lot Help needed moving to and from a bed to a chair (including a wheelchair)?: A Lot Help needed standing up from a chair using your arms (e.g., wheelchair or bedside chair)?: A Lot Help needed to walk in hospital room?: Total Help needed climbing 3-5 steps with a railing? :  Total 6 Click Score: 10    End of Session Equipment Utilized During Treatment: Gait belt Activity Tolerance: Patient limited by fatigue Patient left: in chair;with chair alarm set;with SCD's reapplied Nurse Communication: Mobility status PT Visit Diagnosis: Muscle weakness (generalized) (M62.81);History of falling (Z91.81);Difficulty in walking, not elsewhere classified (R26.2)     Time: [id number] PT Time Calculation (min) (ACUTE ONLY): 24 min  Charges:  $Gait Training: 8-22 mins $Therapeutic Exercise: 8-22 mins                     Elizabeth PalauStephanie Garek Schuneman, PT, DPT [phone]    Kim Roy 01/22/2019, 10:13 AM

## 2019-01-22 NOTE — Progress Notes (Signed)
Inpatient Diabetes Program Recommendations  AACE/ADA: New Consensus Statement on Inpatient Glycemic Control   Target Ranges:  Prepandial:   less than 140 mg/dL      Peak postprandial:   less than 180 mg/dL (1-2 hours)      Critically ill patients:  140 - 180 mg/dL   Results for MASEN, WINDELL (MRN [id number]) as of 01/22/2019 10:04  Ref. Range 01/21/2019 06:14 01/21/2019 12:01 01/21/2019 18:29 01/22/2019 00:56 01/22/2019 02:59 01/22/2019 06:34  Glucose-Capillary Latest Ref Range: 70 - 99 mg/dL 470 (H) 929 (H) 83 58 (L) 142 (H) 107 (H)   Review of Glycemic Control  Diabetes history: DM2 Outpatient Diabetes medications: Metformin 1000 mg daily, Glipizide 10 mg BID Current orders for Inpatient glycemic control: Metformin 1000 mg daily, Glipizide 10 mg BID, Novolog 0-9 units Q6H  Inpatient Diabetes Program Recommendations:   Oral Agents: Noted glucose down to 58 mg/dl on 12/26/45 and fasting glucsoe 107 mg/dl today. Please consider decreasing Glipizide to 10 mg daily (instead of BID).   Thanks, Orlando Penner, RN, MSN, CDE Diabetes Coordinator Inpatient Diabetes Program [phone] (Team Pager from 8am to 5pm)

## 2019-01-22 NOTE — Discharge Instructions (Signed)
Skilled nursing facility to remove staples and apply Steri-Strips on 02/04/2019.  Follow-up with [facility] orthopedics in 6 weeks

## 2019-01-22 NOTE — Progress Notes (Signed)
[facility] - [place] [facility]                                                                                                                                                                                  Patient Demographics   Kim Roy, is a 85 y.o. female, DOB - [birth year], RUE:[id number]  Admit date - 01/19/2019   Admitting Physician Oralia Manis, MD  Outpatient Primary MD for the patient is [facility]   LOS - 3  Subjective: Patient denies any complaints No chest pain no shortness of breath   Review of Systems:    CONSTITUTIONAL: No documented fever. No fatigue, weakness. No weight gain, no weight loss.  EYES: No blurry or double vision.  ENT: No tinnitus. No postnasal drip. No redness of the oropharynx.  RESPIRATORY: No cough, no wheeze, no hemoptysis. No dyspnea.  CARDIOVASCULAR: No chest pain. No orthopnea. No palpitations. No syncope.  GASTROINTESTINAL: No nausea, no vomiting or diarrhea. No abdominal pain. No melena or hematochezia.  GENITOURINARY:  No urgency. No frequency. No dysuria. No hematuria. No obstructive symptoms. No discharge. No pain. No significant abnormal bleeding ENDOCRINE: No polyuria or nocturia. No heat or cold intolerance.  HEMATOLOGY: No anemia. No bruising. No bleeding. No purpura. No petechiae INTEGUMENTARY: No rashes. No lesions.  MUSCULOSKELETAL: No arthritis. No swelling. No gout.  NEUROLOGIC: No numbness, tingling, or ataxia. No seizure-type activity.  PSYCHIATRIC: No anxiety. No insomnia. No ADD.       Vitals:   Vitals:   01/21/19 1439 01/21/19 2308 01/22/19 0418 01/22/19 0826  BP: 128/66 139/71 (!) 143/69 (!) 141/82  Pulse: 71 73 71 69  Resp: Temp: 98.1 F (36.7 C) 98.7 F (37.1 C) 98 F (36.7 C) 98.1 F (36.7 C)  TempSrc: Oral Oral Oral Oral  SpO2: 98% 97% 97% 100%  Weight:      Height:        Wt Readings from Last 3 Encounters:  01/19/19 60.8 kg   12/19/16 69.9 kg     Intake/Output Summary (Last 24 hours) at 01/22/2019 1023 Last data filed at 01/22/2019 0921 Gross per 24 hour  Intake 240 ml  Output 1100 ml  Net -860 ml    Physical Exam:   GENERAL: Pleasant-appearing in no apparent distress.  HEAD, EYES, EARS, NOSE AND THROAT: Atraumatic, normocephalic. Extraocular muscles are intact. Pupils equal and reactive to light. Sclerae anicteric. No conjunctival injection. No oro-pharyngeal erythema.  NECK: Supple. There is no jugular venous distention. No bruits, no lymphadenopathy, no thyromegaly.  HEART: Regular rate and rhythm,. No murmurs, no rubs, no clicks.  LUNGS: Clear to  auscultation bilaterally. No rales or rhonchi. No wheezes.  ABDOMEN: Soft, flat, nontender, nondistended. Has good bowel sounds. No hepatosplenomegaly appreciated.  EXTREMITIES: No evidence of any cyanosis, clubbing, or peripheral edema.  +2 pedal and radial pulses bilaterally.  NEUROLOGIC: The patient is alert, awake,  with no focal motor or sensory deficits appreciated bilaterally.  SKIN: Moist and warm with no rashes appreciated.  Psych: Not anxious, depressed LN: No inguinal LN enlargement    Antibiotics   Anti-infectives (From admission, onward)   Start     Dose/Rate Route Frequency Ordered Stop   01/20/19 2300  ceFAZolin (ANCEF) IVPB 2g/100 mL premix     2 g 200 mL/hr over 30 Minutes Intravenous Every 6 hours 01/20/19 1821 01/21/19 1207   01/20/19 0700  ceFAZolin (ANCEF) IVPB 1 g/50 mL premix     1 g 100 mL/hr over 30 Minutes Intravenous  Once 01/20/19 0645 01/20/19 0900      Medications   Scheduled Meds: . amLODipine  10 mg Oral Daily  . aspirin EC  325 mg Oral Q breakfast  . atorvastatin  40 mg Oral q1800  . carvedilol  12.5 mg Oral BID  . docusate sodium  100 mg Oral BID  . glipiZIDE  10 mg Oral BID  . insulin aspart  0-9 Units Subcutaneous Q6H  . lisinopril  20 mg Oral Daily  . loratadine  10 mg Oral Daily  . metFORMIN  1,000 mg  Oral Daily   Continuous Infusions: . sodium chloride Stopped (01/21/19 1257)  . methocarbamol (ROBAXIN) IV     PRN Meds:.acetaminophen **OR** acetaminophen, alum & mag hydroxide-simeth, bisacodyl, HYDROcodone-acetaminophen, magnesium citrate, magnesium hydroxide, menthol-cetylpyridinium **OR** phenol, methocarbamol **OR** methocarbamol (ROBAXIN) IV, metoCLOPramide **OR** metoCLOPramide (REGLAN) injection, morphine injection, ondansetron **OR** ondansetron (ZOFRAN) IV, oxyCODONE, zolpidem   Data Review:   Micro Results Recent Results (from the past 240 hour(s))  SARS Coronavirus 2 (CEPHEID - Performed in [facility] hospital lab), Hosp Order     Status: None   Collection Time: 01/19/19 10:59 PM  Result Value Ref Range Status   SARS Coronavirus 2 NEGATIVE NEGATIVE Final    Comment: (NOTE) If result is NEGATIVE SARS-CoV-2 target nucleic acids are NOT DETECTED. The SARS-CoV-2 RNA is generally detectable in upper and lower  respiratory specimens during the acute phase of infection. The lowest  concentration of SARS-CoV-2 viral copies this assay can detect is 250  copies / mL. A negative result does not preclude SARS-CoV-2 infection  and should not be used as the sole basis for treatment or other  patient management decisions.  A negative result may occur with  improper specimen collection / handling, submission of specimen other  than nasopharyngeal swab, presence of viral mutation(s) within the  areas targeted by this assay, and inadequate number of viral copies  (<250 copies / mL). A negative result must be combined with clinical  observations, patient history, and epidemiological information. If result is POSITIVE SARS-CoV-2 target nucleic acids are DETECTED. The SARS-CoV-2 RNA is generally detectable in upper and lower  respiratory specimens dur ing the acute phase of infection.  Positive  results are indicative of active infection with SARS-CoV-2.  Clinical  correlation with  patient history and other diagnostic information is  necessary to determine patient infection status.  Positive results do  not rule out bacterial infection or co-infection with other viruses. If result is PRESUMPTIVE POSTIVE SARS-CoV-2 nucleic acids MAY BE PRESENT.   A presumptive positive result was obtained on the submitted specimen  and confirmed on repeat testing.  While 2019 novel coronavirus  (SARS-CoV-2) nucleic acids may be present in the submitted sample  additional confirmatory testing may be necessary for epidemiological  and / or clinical management purposes  to differentiate between  SARS-CoV-2 and other Sarbecovirus currently known to infect humans.  If clinically indicated additional testing with an alternate test  methodology [id number]) is advised. The SARS-CoV-2 RNA is generally  detectable in upper and lower respiratory sp ecimens during the acute  phase of infection. The expected result is Negative. Fact Sheet for Patients:  BoilerBrush.com.cy Fact Sheet for Healthcare Providers: https://pope.com/ This test is not yet approved or cleared by the [place] FDA and has been authorized for detection and/or diagnosis of SARS-CoV-2 by FDA under an Emergency Use Authorization (EUA).  This EUA will remain in effect (meaning this test can be used) for the duration of the COVID-19 declaration under Section 564(b)(1) of the Act, 21 U.S.C. section 360bbb-3(b)(1), unless the authorization is terminated or revoked sooner. Performed at [facility], [address], [place], [place] [address]     Radiology Reports Dg Chest 1 View  Result Date: 01/19/2019 CLINICAL DATA:  Preop hip fracture EXAM: CHEST  1 VIEW COMPARISON:  None. FINDINGS: Cardiomegaly. Interstitial prominence within the lungs, favor chronic lung disease. Lingular scarring. No effusions or acute bony abnormality. IMPRESSION: Cardiomegaly. Interstitial  prominence, likely chronic interstitial lung disease. Lingular scarring. Electronically Signed   By: Charlett Nose M.D.   On: 01/19/2019 22:12   Dg Pelvis 1-2 Views  Result Date: 01/19/2019 CLINICAL DATA:  Fall EXAM: PELVIS - 1-2 VIEW COMPARISON:  None. FINDINGS: There is a left femoral neck fracture. No angulation. No subluxation or dislocation. Screws within the right proximal femur related to remote injury. IMPRESSION: Nondisplaced left femoral neck fracture. Electronically Signed   By: Charlett Nose M.D.   On: 01/19/2019 21:38   Dg Ankle Complete Left  Result Date: 01/19/2019 CLINICAL DATA:  Fall EXAM: LEFT ANKLE COMPLETE - 3+ VIEW COMPARISON:  None. FINDINGS: No fracture or malalignment. Ankle mortise is symmetric. Diffuse soft tissue swelling. Moderate plantar calcaneal spur IMPRESSION: No acute osseous abnormality Electronically Signed   By: Jasmine Pang M.D.   On: 01/19/2019 21:40   Ct Hip Left Wo Contrast  Result Date: 01/19/2019 CLINICAL DATA:  Fall.  Concern for femoral neck fracture on x-rays EXAM: CT OF THE LEFT HIP WITHOUT CONTRAST TECHNIQUE: Multidetector CT imaging of the left hip was performed according to the standard protocol. Multiplanar CT image reconstructions were also generated. COMPARISON:  Left hip plain films 01/19/2019 FINDINGS: There is a nondisplaced left femoral neck fracture, best seen on coronal reconstructed images. No angulation. No subluxation or dislocation. IMPRESSION: Nondisplaced left femoral neck fracture. Electronically Signed   By: Charlett Nose M.D.   On: 01/19/2019 23:41   Dg Knee Complete 4 Views Left  Result Date: 01/19/2019 CLINICAL DATA:  Fall EXAM: LEFT KNEE - COMPLETE 4+ VIEW COMPARISON:  None. FINDINGS: Degenerative changes diffusely throughout the left knee. No acute bony abnormality. Specifically, no fracture, subluxation, or dislocation. No joint effusion. Vascular calcification. IMPRESSION: Tricompartment degenerative changes.  No acute bony  abnormality. Electronically Signed   By: Charlett Nose M.D.   On: 01/19/2019 21:38   Dg Hip Operative Unilat With Pelvis Left  Result Date: 01/20/2019 CLINICAL DATA:  Intraoperative imaging for fixation of a subcapital left hip fracture the patient suffered in a fall 01/19/2019. Initial encounter. EXAM: OPERATIVE RIGHT HIP (WITH PELVIS IF  PERFORMED) 2 VIEWS TECHNIQUE: Fluoroscopic spot image(s) were submitted for interpretation post-operatively. COMPARISON:  CT left hip 01/19/2019. FINDINGS: Fluoroscopic spot views demonstrate placement of 3 screws for fixation of a left subcapital fracture. Hardware is appropriately positioned. Position and alignment are anatomic. IMPRESSION: Intraoperative imaging for fixation of a left subcapital fracture. Electronically Signed   By: Drusilla Kannerhomas  Dalessio M.D.   On: 01/20/2019 17:38   Dg Hip Unilat With Pelvis 2-3 Views Left  Result Date: 01/19/2019 CLINICAL DATA:  Fall with left hip pain EXAM: DG HIP (WITH OR WITHOUT PELVIS) 2-3V LEFT COMPARISON:  12/19/2016 FINDINGS: Suspect a subcapital femoral neck fracture with slight posterior impaction seen on the cross-table lateral. Femoral neck is foreshortened on the AP view. No dislocation. IMPRESSION: Suspect a nondisplaced subcapital femoral neck fracture. Recommend CT for confirmation Electronically Signed   By: Marnee SpringJonathon  Watts M.D.   On: 01/19/2019 22:11     CBC Recent Labs  Lab 01/19/19 2331 01/20/19 0819 01/21/19 0334 01/22/19 0315  WBC 5.1 5.0 7.2 4.8  HGB 12.5 12.1 11.8* 11.0*  HCT 39.4 39.1 37.5 34.9*  PLT 164 160 158 164  MCV 87.2 88.3 86.4 86.4  MCH 27.7 27.3 27.2 27.2  MCHC 31.7 30.9 31.5 31.5  RDW 14.8 14.7 14.6 14.6    Chemistries  Recent Labs  Lab 01/19/19 2331 01/20/19 0819 01/21/19 0334 01/22/19 0315  NA 139 140 138 132*  K 3.6 3.9 3.6 3.8  CL 101 104 104 98  CO2 28 29 25 24   GLUCOSE 155* 140* 125* 134*  BUN 13 14 17  24*  CREATININE 0.68 0.72 0.97 0.82  CALCIUM 9.1 9.0 8.6* 8.4*    ------------------------------------------------------------------------------------------------------------------ estimated creatinine clearance is 46.1 mL/min (by C-G formula based on SCr of 0.82 mg/dL). ------------------------------------------------------------------------------------------------------------------ No results for input(s): HGBA1C in the last 72 hours. ------------------------------------------------------------------------------------------------------------------ No results for input(s): CHOL, HDL, LDLCALC, TRIG, CHOLHDL, LDLDIRECT in the last 72 hours. ------------------------------------------------------------------------------------------------------------------ No results for input(s): TSH, T4TOTAL, T3FREE, THYROIDAB in the last 72 hours.  Invalid input(s): FREET3 ------------------------------------------------------------------------------------------------------------------ No results for input(s): VITAMINB12, FOLATE, FERRITIN, TIBC, IRON, RETICCTPCT in the last 72 hours.  Coagulation profile No results for input(s): INR, PROTIME in the last 168 hours.  No results for input(s): DDIMER in the last 72 hours.  Cardiac Enzymes No results for input(s): CKMB, TROPONINI, MYOGLOBIN in the last 168 hours.  Invalid input(s): CK ------------------------------------------------------------------------------------------------------------------ Invalid input(s): POCBNP    Assessment & Plan   Patient is 85 year old status post fall   Closed left hip fracture (HCC) -status post repair postop day 2, PT evaluation done, I will asked social worker to see  Diabetes (HCC) continue sliding-scale continue metformin  HTN (hypertension) -continue lisinopril, HCTZ on hold  Hyperlipidemia continue Lipitor     Code Status Orders  (From admission, onward)         Start     Ordered   01/20/19 0728  Full code  Continuous     01/20/19 0727        Code Status  History    This patient has a current code status but no historical code status.           Consults orthopedic  DVT Prophylaxis SCDs for now  Lab Results  Component Value Date   PLT 164 01/22/2019     Time Spent in minutes   35min Greater than 50% of time spent in care coordination and counseling patient regarding the condition and plan of care.   Auburn BilberryShreyang Hae Ahlers M.D on 01/22/2019  at 10:23 AM  Between 7am to 6pm - Pager - [phone]  After 6pm go to [email or web address] - Social research officer, government  [employer]   Office  [phone]

## 2019-01-22 NOTE — TOC Progression Note (Signed)
Transition of Care [facility]) - Progression Note    Patient Details  Name: Kim Roy MRN: [id number] Date of Birth: October 25, 1930  Transition of Care [facility]) CM/SW Contact  Barrie Dunker, RN Phone Number: 01/22/2019, 4:13 PM  Clinical Narrative:    Daughter in law Lilyauna Beckert called and stated that they have decided to go to [facility], notified [place] at [facility] of the bed acceptance.and requested a call back with a room number   Expected Discharge Plan: Skilled Nursing Facility Barriers to Discharge: Continued Medical Work up  Expected Discharge Plan and Services Expected Discharge Plan: Skilled Nursing Facility       Living arrangements for the past 2 months: Skilled Nursing Facility                                       Social Determinants of Health (SDOH) Interventions    Readmission Risk Interventions No flowsheet data found.

## 2019-01-22 NOTE — TOC Progression Note (Signed)
Transition of Care [facility]) - Progression Note    Patient Details  Name: Kim Roy MRN: [id number] Date of Birth: 1930/11/28  Transition of Care [facility]) CM/SW Contact  Barrie Dunker, RN Phone Number: 01/22/2019, 2:18 PM  Clinical Narrative:    Called daughter Marylene Land and let her know that  Both SNFs that she wanted [facility] and [facility] has made a bed offer, she stated she would speak it over with her husband and will call me back with a choice I gave her my contact information to call back   Expected Discharge Plan: Skilled Nursing Facility Barriers to Discharge: Continued Medical Work up  Expected Discharge Plan and Services Expected Discharge Plan: Skilled Nursing Facility       Living arrangements for the past 2 months: Skilled Nursing Facility                                       Social Determinants of Health (SDOH) Interventions    Readmission Risk Interventions No flowsheet data found.

## 2019-01-22 NOTE — Progress Notes (Addendum)
   Subjective: 2 Days Post-Op Procedure(s) (LRB): CANNULATED HIP PINNING (Left) Patient reports pain as mild.   Patient is well, and has had no acute complaints or problems Denies any CP, SOB, ABD pain. We will continue therapy today.   Objective: Vital signs in last 24 hours: Temp:  [97.6 F (36.4 C)-98.7 F (37.1 C)] 98 F (36.7 C) (06/02 0418) Pulse Rate:  [70-75] 71 (06/02 0418) Resp:  [16-18] 16 (06/02 0418) BP: (115-143)/(66-71) 143/69 (06/02 0418) SpO2:  [97 %-98 %] 97 % (06/02 0418)  Intake/Output from previous day: 06/01 0701 - 06/02 0700 In: 240 [P.O.:240] Out: 450 [Urine:450] Intake/Output this shift: No intake/output data recorded.  Recent Labs    01/19/19 2331 01/20/19 0819 01/21/19 0334 01/22/19 0315  HGB 12.5 12.1 11.8* 11.0*   Recent Labs    01/21/19 0334 01/22/19 0315  WBC 7.2 4.8  RBC 4.34 4.04  HCT 37.5 34.9*  PLT 158 164   Recent Labs    01/21/19 0334 01/22/19 0315  NA 138 132*  K 3.6 3.8  CL 104 98  CO2 25 24  BUN 17 24*  CREATININE 0.97 0.82  GLUCOSE 125* 134*  CALCIUM 8.6* 8.4*   No results for input(s): LABPT, INR in the last 72 hours.  EXAM General - Patient is Alert, Appropriate and Oriented Extremity - Neurovascular intact Sensation intact distally Intact pulses distally Dorsiflexion/Plantar flexion intact No cellulitis present Compartment soft Dressing - dressing C/D/I and no drainage Motor Function - intact, moving foot and toes well on exam.   Past Medical History:  Diagnosis Date  . Diabetes mellitus without complication (HCC)   . HTN (hypertension)     Assessment/Plan:   2 Days Post-Op Procedure(s) (LRB): CANNULATED HIP PINNING (Left) Principal Problem:   Closed left hip fracture (HCC) Active Problems:   Diabetes (HCC)   HTN (hypertension)  Estimated body mass index is 21.63 kg/m as calculated from the following:   Height as of this encounter: 5\' 6"  (1.676 m).   Weight as of this encounter: 60.8  kg. Advance diet Up with therapy  Needs BM Recheck labs in the am Pain well controlled VSS CM to assist with discharge  Skilled nursing facility to remove staples on 02/04/2019 Follow-up with [facility] orthopedics in 6 weeks TED hose bilateral lower extremities x6 weeks  DVT Prophylaxis - Aspirin, TED hose and SCDs Weight-Bearing as tolerated to left leg   T. Cranston Neighbor, PA-C [facility] 01/22/2019, 7:14 AM

## 2019-01-22 NOTE — Progress Notes (Signed)
Physical Therapy Treatment Patient Details Name: Kim Roy MRN: [id number] DOB: 09/08/1930 Today's Date: 01/22/2019    History of Present Illness Pt admitted for L hip fx s/p hip pinning 5/31 after she fell getting out of a car. History includes DM and HTN.    PT Comments    Pt is making limited progress this session due to fatigue. Pt able to tolerate sitting in recliner for the entire day. Good endurance with there-ex, continues to be weak on L LE. Needs +2 assist to return back to bed. Will continue to progress as able.  Follow Up Recommendations  SNF     Equipment Recommendations  None recommended by PT    Recommendations for Other Services       Precautions / Restrictions Precautions Precautions: Fall Restrictions Weight Bearing Restrictions: Yes LLE Weight Bearing: Weight bearing as tolerated    Mobility  Bed Mobility Overal bed mobility: Needs Assistance Bed Mobility: Sit to Supine       Sit to supine: Mod assist;+2 for physical assistance   General bed mobility comments: needs assist to bring B LE back onto bed. Once in bed, needs +2 to scoot up towards HOB. HEavy cues for sequencing  Transfers Overall transfer level: Needs assistance Equipment used: Rolling walker (2 wheeled) Transfers: Sit to/from Stand Sit to Stand: Mod assist;+2 physical assistance         General transfer comment: cues for hand placement prior to transfer. Pt fatigues quickly, unable to stand upright. 2 attempts required with/without RW. Performed SPT with max assist +2 and no RW to return to bed.  Ambulation/Gait             General Gait Details: unable due to weakness.   Stairs             Wheelchair Mobility    Modified Rankin (Stroke Patients Only)       Balance Overall balance assessment: Needs assistance;History of Falls Sitting-balance support: Feet supported;Single extremity supported Sitting balance-Leahy Scale: Good     Standing balance  support: Bilateral upper extremity supported Standing balance-Leahy Scale: Poor                              Cognition Arousal/Alertness: Awake/alert Behavior During Therapy: WFL for tasks assessed/performed Overall Cognitive Status: Difficult to assess                                        Exercises Other Exercises Other Exercises: seated ther-ex performed including B LE ankle pumps, glut sets, SAQ, hip add squeezes, and SLRs. All ther-ex performed x 12 reps with min assist on B LE. Cues for participation    General Comments        Pertinent Vitals/Pain Pain Assessment: No/denies pain    Home Living                      Prior Function            PT Goals (current goals can now be found in the care plan section) Acute Rehab PT Goals Patient Stated Goal: to walk again PT Goal Formulation: With patient Time For Goal Achievement: 02/04/19 Potential to Achieve Goals: Good Progress towards PT goals: Progressing toward goals    Frequency    BID      PT Plan Current plan  remains appropriate    Co-evaluation              AM-PAC PT "6 Clicks" Mobility   Outcome Measure  Help needed turning from your back to your side while in a flat bed without using bedrails?: A Lot Help needed moving from lying on your back to sitting on the side of a flat bed without using bedrails?: A Lot Help needed moving to and from a bed to a chair (including a wheelchair)?: A Lot Help needed standing up from a chair using your arms (e.g., wheelchair or bedside chair)?: A Lot Help needed to walk in hospital room?: Total Help needed climbing 3-5 steps with a railing? : Total 6 Click Score: 10    End of Session Equipment Utilized During Treatment: Gait belt Activity Tolerance: Patient limited by fatigue Patient left: in bed;with bed alarm set;with SCD's reapplied Nurse Communication: Mobility status PT Visit Diagnosis: Muscle weakness  (generalized) (M62.81);History of falling (Z91.81);Difficulty in walking, not elsewhere classified (R26.2)     Time: [id number] PT Time Calculation (min) (ACUTE ONLY): 23 min  Charges:  $Therapeutic Exercise: 8-22 mins $Therapeutic Activity: 8-22 mins                     Elizabeth PalauStephanie Hagan Vanauken, PT, DPT [phone]    Shalona Harbour 01/22/2019, 3:13 PM

## 2019-01-22 NOTE — Care Management Important Message (Signed)
Important Message  Patient Details  Name: Kim Roy MRN: [id number] Date of Birth: 1931-07-18   Medicare Important Message Given:  Yes    [name] 01/22/2019, 10:49 AM

## 2019-01-23 LAB — SARS CORONAVIRUS 2 BY RT PCR (HOSPITAL ORDER, PERFORMED IN ~~LOC~~ HOSPITAL LAB): SARS Coronavirus 2: NEGATIVE

## 2019-01-23 LAB — GLUCOSE, CAPILLARY
Glucose-Capillary: 157 mg/dL — ABNORMAL HIGH (ref 70–99)
Glucose-Capillary: 236 mg/dL — ABNORMAL HIGH (ref 70–99)

## 2019-01-23 MED ORDER — ACETAMINOPHEN 325 MG PO TABS: 650.0000 mg | ORAL_TABLET | Freq: Four times a day (QID) | ORAL | Status: AC | PRN

## 2019-01-23 MED ORDER — DOCUSATE SODIUM 100 MG PO CAPS: 100.0000 mg | ORAL_CAPSULE | Freq: Two times a day (BID) | ORAL | 0 refills | Status: AC

## 2019-01-23 NOTE — TOC Progression Note (Signed)
Transition of Care [facility]) - Progression Note    Patient Details  Name: Kim Roy MRN: [id number] Date of Birth: 1930-12-18  Transition [facility] Care [facility]) CM/SW Contact  Barrie Dunker, RN Phone Number: 01/23/2019, 9:55 AM  Clinical Narrative:    Spoke with Verlon Au at [facility] and got the room number 501 I called and spoke to Marylene Land the daughter in law and let her know that the patient will DC today to room 501 at [facility],. I Notified the patient that it is Room 501 as well, she was on the phonewith her son and told him the room number Bedside nurse to call report to [phone]    Expected Discharge Plan: Skilled Nursing Facility Barriers to Discharge: Continued Medical Work up  Expected Discharge Plan and Services Expected Discharge Plan: Skilled Nursing Facility       Living arrangements for the past 2 months: Skilled Nursing Facility Expected Discharge Date: 01/23/19                                     Social Determinants of Health (SDOH) Interventions    Readmission Risk Interventions No flowsheet data found.

## 2019-01-23 NOTE — Progress Notes (Signed)
Physical Therapy Treatment Patient Details Name: Kim SierrasHattie Evans Roy MRN: [id number] DOB: 09/15/1930 Today's Date: 01/23/2019    History of Present Illness Pt admitted for L hip fx s/p hip pinning 5/31 after she fell getting out of a car. History includes DM and HTN.    PT Comments    Pt is making gradual progress towards goals and is limited by L LE buckling this date. Required max assist for SPT to recliner. Still requires increased assist for there-ex. Will continue to progress.   Follow Up Recommendations  SNF     Equipment Recommendations  None recommended by PT    Recommendations for Other Services       Precautions / Restrictions Precautions Precautions: Fall Restrictions Weight Bearing Restrictions: Yes LLE Weight Bearing: Weight bearing as tolerated    Mobility  Bed Mobility Overal bed mobility: Needs Assistance Bed Mobility: Supine to Sit     Supine to sit: Mod assist;+2 for physical assistance     General bed mobility comments: pt able to initiate movement of B LE and upper body to come to EOB, however still needs significant assist for completing transfer and sitting at EOB. Once seated, able to sit with cga.  Transfers Overall transfer level: Needs assistance Equipment used: Rolling walker (2 wheeled) Transfers: Sit to/from Stand Sit to Stand: Mod assist;+2 physical assistance         General transfer comment: cues for hand placement prior to transfer. Fatigues quickly and has buckling of L LE with WBing. Return back to seated position  Ambulation/Gait Ambulation/Gait assistance: Max assist;+2 physical assistance Gait Distance (Feet): 1 Feet Assistive device: Rolling walker (2 wheeled) Gait Pattern/deviations: Step-to pattern     General Gait Details: attempted a few steps with very short step length. Pt with buckling noted and unable to further ambulate. Performed SPT to recliner with max assist +2. and no RW   Stairs              Wheelchair Mobility    Modified Rankin (Stroke Patients Only)       Balance Overall balance assessment: Needs assistance;History of Falls Sitting-balance support: Feet supported;Single extremity supported Sitting balance-Leahy Scale: Good     Standing balance support: Bilateral upper extremity supported Standing balance-Leahy Scale: Poor                              Cognition Arousal/Alertness: Awake/alert Behavior During Therapy: WFL for tasks assessed/performed Overall Cognitive Status: Difficult to assess                                        Exercises Other Exercises Other Exercises: supine ther-ex performed including ankle pumps, SLRs, SAQ, heel slides, and hip abd/add. Ther-ex performed on B LE x 15 reps with min assist on R and L LE.    General Comments        Pertinent Vitals/Pain Pain Assessment: Faces Faces Pain Scale: Hurts little more Pain Location: L leg Pain Descriptors / Indicators: Operative site guarding Pain Intervention(s): Limited activity within patient's tolerance;Repositioned    Home Living                      Prior Function            PT Goals (current goals can now be found in the care plan section) Acute  Rehab PT Goals Patient Stated Goal: to walk again PT Goal Formulation: With patient Time For Goal Achievement: 02/04/19 Potential to Achieve Goals: Good Progress towards PT goals: Progressing toward goals    Frequency    BID      PT Plan Current plan remains appropriate    Co-evaluation              AM-PAC PT "6 Clicks" Mobility   Outcome Measure  Help needed turning from your back to your side while in a flat bed without using bedrails?: A Lot Help needed moving from lying on your back to sitting on the side of a flat bed without using bedrails?: A Lot Help needed moving to and from a bed to a chair (including a wheelchair)?: A Lot Help needed standing up from a chair  using your arms (e.g., wheelchair or bedside chair)?: A Lot Help needed to walk in hospital room?: Total Help needed climbing 3-5 steps with a railing? : Total 6 Click Score: 10    End of Session Equipment Utilized During Treatment: Gait belt Activity Tolerance: Patient limited by fatigue Patient left: in chair;with chair alarm set;with SCD's reapplied Nurse Communication: Mobility status PT Visit Diagnosis: Muscle weakness (generalized) (M62.81);History of falling (Z91.81);Difficulty in walking, not elsewhere classified (R26.2)     Time: 4193-7902 PT Time Calculation (min) (ACUTE ONLY): 23 min  Charges:  $Therapeutic Exercise: 8-22 mins $Therapeutic Activity: 8-22 mins                     Elizabeth Palau, PT, DPT [phone]    Kim Roy 01/23/2019, 10:31 AM

## 2019-01-23 NOTE — Discharge Summary (Signed)
[facility] [place] [facility], 85 y.o., DOB 07-11-1931, MRN [id number]. Admission date: 01/19/2019 Discharge Date 01/23/2019 Primary MD [facility] Admitting Physician Oralia Manis, MD  Admission Diagnosis  Fall [W19.XXXA] Closed left hip fracture (HCC) [S72.002A] Closed fracture of left hip, initial encounter [facility]) [S72.002A]  Discharge Diagnosis   Principal Problem: Closed left hip fracture Essential hypertension Diabetes type 2     Hospital Course Kim Roy  is a 85 y.o. female who presents with chief complaint as above.  Patient presents to the ED reporting that she fell 2 days ago.  She states that she fell onto her left side.  Patient was evaluated and seen by orthopedics and underwent repair.  Patient tolerated the procedure without any complications.  She is doing much better and is stable for discharge.     Below are instructions per orthopedics fpr SNF  Skilled nursing facility to remove staples on 02/04/2019 Follow[facility]up with [facility]opedics in 6 weeks TED hose bilateral lower extremities x6 weeks  DVT Prophylaxis - Aspirin, TED hose and SCDs Weight-Bearing as tolerated to left leg     Consults  orthopedic surgery  Significant Tests:  See full reports for all details     Dg Chest 1 View  Result Date: 01/19/2019 CLINICAL DATA:  Preop hip fracture EXAM: CHEST  1 VIEW COMPARISON:  None. FINDINGS: Cardiomegaly. Interstitial prominence within the lungs, favor chronic lung disease. Lingular scarring. No effusions or acute bony abnormality. IMPRESSION: Cardiomegaly. Interstitial prominence, likely chronic interstitial lung disease. Lingular scarring. Electronically Signed   By: Charlett Nose M.D.   On: 01/19/2019 22:12   Dg Pelvis 1-2 Views  Result Date: 01/19/2019 CLINICAL DATA:  Fall EXAM: PELVIS - 1-2 VIEW COMPARISON:  None. FINDINGS: There is a left femoral neck fracture. No angulation. No  subluxation or dislocation. Screws within the right proximal femur related to remote injury. IMPRESSION: Nondisplaced left femoral neck fracture. Electronically Signed   By: Charlett Nose M.D.   On: 01/19/2019 21:38   Dg Ankle Complete Left  Result Date: 01/19/2019 CLINICAL DATA:  Fall EXAM: LEFT ANKLE COMPLETE - 3+ VIEW COMPARISON:  None. FINDINGS: No fracture or malalignment. Ankle mortise is symmetric. Diffuse soft tissue swelling. Moderate plantar calcaneal spur IMPRESSION: No acute osseous abnormality Electronically Signed   By: Jasmine Pang M.D.   On: 01/19/2019 21:40   Ct Hip Left Wo Contrast  Result Date: 01/19/2019 CLINICAL DATA:  Fall.  Concern for femoral neck fracture on x-rays EXAM: CT OF THE LEFT HIP WITHOUT CONTRAST TECHNIQUE: Multidetector CT imaging of the left hip was performed according to the standard protocol. Multiplanar CT image reconstructions were also generated. COMPARISON:  Left hip plain films 01/19/2019 FINDINGS: There is a nondisplaced left femoral neck fracture, best seen on coronal reconstructed images. No angulation. No subluxation or dislocation. IMPRESSION: Nondisplaced left femoral neck fracture. Electronically Signed   By: Charlett Nose M.D.   On: 01/19/2019 23:41   Dg Knee Complete 4 Views Left  Result Date: 01/19/2019 CLINICAL DATA:  Fall EXAM: LEFT KNEE - COMPLETE 4+ VIEW COMPARISON:  None. FINDINGS: Degenerative changes diffusely throughout the left knee. No acute bony abnormality. Specifically, no fracture, subluxation, or dislocation. No joint effusion. Vascular calcification. IMPRESSION: Tricompartment degenerative changes.  No acute bony abnormality. Electronically Signed   By: Charlett Nose M.D.   On: 01/19/2019 21:38   Dg Hip Operative Unilat With Pelvis Left  Result Date: 01/20/2019 CLINICAL DATA:  Intraoperative imaging for fixation of a subcapital left hip fracture the patient suffered in a fall 01/19/2019. Initial encounter. EXAM: OPERATIVE RIGHT HIP  (WITH PELVIS IF PERFORMED) 2 VIEWS TECHNIQUE: Fluoroscopic spot image(s) were submitted for interpretation post-operatively. COMPARISON:  CT left hip 01/19/2019. FINDINGS: Fluoroscopic spot views demonstrate placement of 3 screws for fixation of a left subcapital fracture. Hardware is appropriately positioned. Position and alignment are anatomic. IMPRESSION: Intraoperative imaging for fixation of a left subcapital fracture. Electronically Signed   By: Drusilla Kanner M.D.   On: 01/20/2019 17:38   Dg Hip Unilat With Pelvis 2-3 Views Left  Result Date: 01/19/2019 CLINICAL DATA:  Fall with left hip pain EXAM: DG HIP (WITH OR WITHOUT PELVIS) 2-3V LEFT COMPARISON:  12/19/2016 FINDINGS: Suspect a subcapital femoral neck fracture with slight posterior impaction seen on the cross-table lateral. Femoral neck is foreshortened on the AP view. No dislocation. IMPRESSION: Suspect a nondisplaced subcapital femoral neck fracture. Recommend CT for confirmation Electronically Signed   By: Marnee Spring M.D.   On: 01/19/2019 22:11       Today   Subjective:   Kim Roy patient has some pain in the hip but otherwise doing well Objective:   Blood pressure (!) 154/95, pulse (!) 42, temperature 98.5 F (36.9 C), temperature source Oral, resp. rate 14, height  (1.676 m), weight 60.8 kg, SpO2 98 %.  .  Intake/Output Summary (Last 24 hours) at 01/23/2019 0855 Last data filed at 01/23/2019 0459 Gross per 24 hour  Intake 600 ml  Output 1675 ml  Net -1075 ml    Exam VITAL SIGNS: Blood pressure (!) 154/95, pulse (!) 42, temperature 98.5 F (36.9 C), temperature source Oral, resp. rate 14, height  (1.676 m), weight 60.8 kg, SpO2 98 %.  GENERAL:  85 y.o.-year-old patient lying in the bed with no acute distress.  EYES: Pupils equal, round, reactive to light and accommodation. No scleral icterus. Extraocular muscles intact.  HEENT: Head atraumatic, normocephalic. Oropharynx and nasopharynx clear.   NECK:  Supple, no jugular venous distention. No thyroid enlargement, no tenderness.  LUNGS: Normal breath sounds bilaterally, no wheezing, rales,rhonchi or crepitation. No use of accessory muscles of respiration.  CARDIOVASCULAR: S1, S2 normal. No murmurs, rubs, or gallops.  ABDOMEN: Soft, nontender, nondistended. Bowel sounds present. No organomegaly or mass.  EXTREMITIES: No pedal edema, cyanosis, or clubbing.  NEUROLOGIC: Cranial nerves II through XII are intact. Muscle strength 5/5 in all extremities. Sensation intact. Gait not checked.  PSYCHIATRIC: The patient is alert and oriented x 3.  SKIN: No obvious rash, lesion, or ulcer.   Data Review     CBC w Diff:  Lab Results  Component Value Date   WBC 4.8 01/22/2019   HGB 11.0 (L) 01/22/2019   HCT 34.9 (L) 01/22/2019   PLT 164 01/22/2019   CMP:  Lab Results  Component Value Date   NA 132 (L) 01/22/2019   K 3.8 01/22/2019   CL 98 01/22/2019   CO2 24 01/22/2019   BUN 24 (H) 01/22/2019   CREATININE 0.82 01/22/2019  .  Micro Results Recent Results (from the past 240 hour(s))  SARS Coronavirus 2 (CEPHEID - Performed in [facility] hospital lab), Hosp Order     Status: None   Collection Time: 01/19/19 10:59 PM  Result Value Ref Range Status   SARS Coronavirus 2 NEGATIVE NEGATIVE Final    Comment: (NOTE) If result is NEGATIVE SARS-CoV-2 target nucleic acids are NOT DETECTED. The SARS-CoV-2 RNA is generally  detectable in upper and lower  respiratory specimens during the acute phase of infection. The lowest  concentration of SARS-CoV-2 viral copies this assay can detect is 250  copies / mL. A negative result does not preclude SARS-CoV-2 infection  and should not be used as the sole basis for treatment or other  patient management decisions.  A negative result may occur with  improper specimen collection / handling, submission of specimen other  than nasopharyngeal swab, presence of viral mutation(s) within the  areas  targeted by this assay, and inadequate number of viral copies  (<250 copies / mL). A negative result must be combined with clinical  observations, patient history, and epidemiological information. If result is POSITIVE SARS-CoV-2 target nucleic acids are DETECTED. The SARS-CoV-2 RNA is generally detectable in upper and lower  respiratory specimens dur ing the acute phase of infection.  Positive  results are indicative of active infection with SARS-CoV-2.  Clinical  correlation with patient history and other diagnostic information is  necessary to determine patient infection status.  Positive results do  not rule out bacterial infection or co-infection with other viruses. If result is PRESUMPTIVE POSTIVE SARS-CoV-2 nucleic acids MAY BE PRESENT.   A presumptive positive result was obtained on the submitted specimen  and confirmed on repeat testing.  While 2019 novel coronavirus  (SARS-CoV-2) nucleic acids may be present in the submitted sample  additional confirmatory testing may be necessary for epidemiological  and / or clinical management purposes  to differentiate between  SARS-CoV-2 and other Sarbecovirus currently known to infect humans.  If clinically indicated additional testing with an alternate test  methodology [phone]) is advised. The SARS-CoV-2 RNA is generally  detectable in upper and lower respiratory sp ecimens during the acute  phase of infection. The expected result is Negative. Fact Sheet for Patients:  BoilerBrush.com.cy Fact Sheet for Healthcare Providers: https://pope.com/ This test is not yet approved or cleared by the [place] FDA and has been authorized for detection and/or diagnosis of SARS-CoV-2 by FDA under an Emergency Use Authorization (EUA).  This EUA will remain in effect (meaning this test can be used) for the duration of the COVID-19 declaration under Section 564(b)(1) of the Act, 21 U.S.C. section  360bbb-3(b)(1), unless the authorization is terminated or revoked sooner. Performed at [facility], [address] [place], [place] [address]         Code Status Orders  (From admission, onward)         Start     Ordered   01/20/19 3729  Full code  Continuous     01/20/19 0727        Code Status History    This patient has a current code status but no historical code status.           Contact information for follow-up providers    Kennedy Bucker, MD Follow up in 6 week(s).   Specialty:  Orthopedic Surgery Contact information: [address] [facility] [place] [address] [phone]            Contact information for after-discharge care    Destination    [facility] .   Service:  Skilled Nursing Contact information: [address] [place] [address] [phone]                  Discharge Medications   Allergies as of 01/23/2019   No Known Allergies     Medication List  TAKE these medications   acetaminophen 325 MG tablet Commonly known as:  TYLENOL Take 2 tablets (650 mg total) by mouth every 6 (six) hours as needed for mild pain (or Fever >/= 101).   amLODipine 10 MG tablet Commonly known as:  NORVASC Take 10 mg by mouth daily.   aspirin 325 MG EC tablet Take 1 tablet (325 mg total) by mouth daily with breakfast. What changed:    medication strength  how much to take  when to take this   atorvastatin 40 MG tablet Commonly known as:  LIPITOR Take 40 mg by mouth daily.   carvedilol 12.5 MG tablet Commonly known as:  COREG Take 12.5 mg by mouth 2 (two) times a day.   cetirizine 10 MG tablet Commonly known as:  ZYRTEC Take 10 mg by mouth daily.   docusate sodium 100 MG capsule Commonly known as:  COLACE Take 1 capsule (100 mg total) by mouth 2 (two) times daily.   glipiZIDE 10 MG tablet Commonly known as:  GLUCOTROL Take 10 mg by  mouth 2 (two) times a day.   HYDROcodone-acetaminophen 5-325 MG tablet Commonly known as:  NORCO/VICODIN Take 1 tablet by mouth every 4 (four) hours as needed for moderate pain (pain score 4-6).   lisinopril-hydrochlorothiazide 20-25 MG tablet Commonly known as:  ZESTORETIC Take 1 tablet by mouth daily.   metFORMIN 1000 MG tablet Commonly known as:  GLUCOPHAGE Take 1,000 mg by mouth daily.          Total Time in preparing paper work, data evaluation and todays exam - 35 minutes  Auburn BilberryShreyang Cabe Lashley M.D on 01/23/2019 at 8:55 AM [facility]   Office  [phone]

## 2019-01-23 NOTE — TOC Transition Note (Signed)
Transition of Care [facility]) - CM/SW Discharge Note   Patient Details  Name: Kim Roy MRN: [id number] Date of Birth: Jan 14, 1931  Transition of Care [facility]) CM/SW Contact:  Barrie Dunker, RN Phone Number: 01/23/2019, 10:00 AM   Clinical Narrative:     [facility] today via EMS transport, Bedside nurse to call report to [phone], Family notified of room number and that they will need to fill out the paper work at [facility] and that the patient will DC today DC packet is on the chart     Barriers to Discharge: Continued Medical Work up   Patient Goals and CMS Choice        Discharge Placement                       Discharge Plan and Services                                     Social Determinants of Health (SDOH) Interventions     Readmission Risk Interventions No flowsheet data found.

## 2019-01-23 NOTE — TOC Progression Note (Signed)
Transition of Care [facility]) - Progression Note    Patient Details  Name: Kim Roy MRN: [id number] Date of Birth: 02-27-1931  Transition of Care [facility]) CM/SW Contact  Barrie Dunker, RN Phone Number: 01/23/2019, 9:17 AM  Clinical Narrative:    Called to get room number from [place] at [facility], left a secure detailed VM asking for a call back with the information   Expected Discharge Plan: Skilled Nursing Facility Barriers to Discharge: Continued Medical Work up  Expected Discharge Plan and Services Expected Discharge Plan: Skilled Nursing Facility       Living arrangements for the past 2 months: Skilled Nursing Facility Expected Discharge Date: 01/23/19                                     Social Determinants of Health (SDOH) Interventions    Readmission Risk Interventions No flowsheet data found.

## 2019-01-23 NOTE — Progress Notes (Signed)
   Subjective: 3 Days Post-Op Procedure(s) (LRB): CANNULATED HIP PINNING (Left) Patient reports pain as mild.   Patient is well, and has had no acute complaints or problems Denies any CP, SOB, ABD pain. We will continue therapy today.   Objective: Vital signs in last 24 hours: Temp:  [98 F (36.7 C)-98.8 F (37.1 C)] 98.5 F (36.9 C) (06/03 0744) Pulse Rate:  [42-78] 42 (06/03 0744) Resp:  [14-18] 14 (06/03 0744) BP: (124-154)/(62-95) 154/95 (06/03 0744) SpO2:  [95 %-100 %] 98 % (06/03 0744)  Intake/Output from previous day: 06/02 0701 - 06/03 0700 In: 600 [P.O.:600] Out: 1675 [Urine:1675] Intake/Output this shift: No intake/output data recorded.  Recent Labs    01/20/19 0819 01/21/19 0334 01/22/19 0315  HGB 12.1 11.8* 11.0*   Recent Labs    01/21/19 0334 01/22/19 0315  WBC 7.2 4.8  RBC 4.34 4.04  HCT 37.5 34.9*  PLT 158 164   Recent Labs    01/21/19 0334 01/22/19 0315  NA 138 132*  K 3.6 3.8  CL 104 98  CO2 25 24  BUN 17 24*  CREATININE 0.97 0.82  GLUCOSE 125* 134*  CALCIUM 8.6* 8.4*   No results for input(s): LABPT, INR in the last 72 hours.  EXAM General - Patient is Alert, Appropriate and Oriented Extremity - Neurovascular intact Sensation intact distally Intact pulses distally Dorsiflexion/Plantar flexion intact No cellulitis present Compartment soft Dressing - dressing C/D/I and no drainage Motor Function - intact, moving foot and toes well on exam.   Past Medical History:  Diagnosis Date  . Diabetes mellitus without complication (HCC)   . HTN (hypertension)     Assessment/Plan:   3 Days Post-Op Procedure(s) (LRB): CANNULATED HIP PINNING (Left) Principal Problem:   Closed left hip fracture (HCC) Active Problems:   Diabetes (HCC)   HTN (hypertension)  Estimated body mass index is 21.63 kg/m as calculated from the following:   Height as of this encounter: 5\' 6"  (1.676 m).   Weight as of this encounter: 60.8 kg. Advance  diet Up with therapy  Pain well controlled VSS CM to assist with discharge to SNF  Skilled nursing facility to remove staples on 02/04/2019 Follow-up with [facility] orthopedics in 6 weeks TED hose bilateral lower extremities x6 weeks  DVT Prophylaxis - Aspirin, TED hose and SCDs Weight-Bearing as tolerated to left leg   T. Cranston Neighbor, PA-C [facility] 01/23/2019, 7:59 AM

## 2019-01-23 NOTE — Progress Notes (Signed)
Called report to [facility] @ [facility]. Answered all questions. EMS called for transport.

## 2020-05-01 ENCOUNTER — Emergency Department: Payer: Medicare (Managed Care)

## 2020-05-01 ENCOUNTER — Inpatient Hospital Stay
Admission: EM | Admit: 2020-05-01 | Discharge: 2020-05-04 | DRG: 640 | Disposition: A | Discharge status: Skilled Nursing Facility | Payer: Medicare (Managed Care) | Source: Skilled Nursing Facility | Attending: Internal Medicine | Admitting: Internal Medicine

## 2020-05-01 DIAGNOSIS — B9620 Unspecified Escherichia coli [E. coli] as the cause of diseases classified elsewhere: Secondary | ICD-10-CM | POA: Diagnosis present

## 2020-05-01 DIAGNOSIS — E860 Dehydration: Secondary | ICD-10-CM | POA: Diagnosis not present

## 2020-05-01 DIAGNOSIS — R55 Syncope and collapse: Secondary | ICD-10-CM | POA: Diagnosis present

## 2020-05-01 DIAGNOSIS — D649 Anemia, unspecified: Secondary | ICD-10-CM | POA: Diagnosis present

## 2020-05-01 DIAGNOSIS — E871 Hypo-osmolality and hyponatremia: Secondary | ICD-10-CM | POA: Diagnosis present

## 2020-05-01 DIAGNOSIS — I10 Essential (primary) hypertension: Secondary | ICD-10-CM | POA: Diagnosis present

## 2020-05-01 DIAGNOSIS — E1151 Type 2 diabetes mellitus with diabetic peripheral angiopathy without gangrene: Secondary | ICD-10-CM | POA: Diagnosis present

## 2020-05-01 DIAGNOSIS — E785 Hyperlipidemia, unspecified: Secondary | ICD-10-CM | POA: Diagnosis not present

## 2020-05-01 DIAGNOSIS — Z7984 Long term (current) use of oral hypoglycemic drugs: Secondary | ICD-10-CM

## 2020-05-01 DIAGNOSIS — N1831 Chronic kidney disease, stage 3a: Secondary | ICD-10-CM | POA: Diagnosis present

## 2020-05-01 DIAGNOSIS — I129 Hypertensive chronic kidney disease with stage 1 through stage 4 chronic kidney disease, or unspecified chronic kidney disease: Secondary | ICD-10-CM | POA: Diagnosis present

## 2020-05-01 DIAGNOSIS — R17 Unspecified jaundice: Secondary | ICD-10-CM | POA: Diagnosis present

## 2020-05-01 DIAGNOSIS — N390 Urinary tract infection, site not specified: Secondary | ICD-10-CM | POA: Diagnosis present

## 2020-05-01 DIAGNOSIS — Z79899 Other long term (current) drug therapy: Secondary | ICD-10-CM

## 2020-05-01 DIAGNOSIS — N183 Chronic kidney disease, stage 3 unspecified: Secondary | ICD-10-CM | POA: Diagnosis present

## 2020-05-01 DIAGNOSIS — R0602 Shortness of breath: Secondary | ICD-10-CM

## 2020-05-01 DIAGNOSIS — R059 Cough, unspecified: Secondary | ICD-10-CM

## 2020-05-01 DIAGNOSIS — Z7982 Long term (current) use of aspirin: Secondary | ICD-10-CM

## 2020-05-01 DIAGNOSIS — E876 Hypokalemia: Secondary | ICD-10-CM | POA: Diagnosis present

## 2020-05-01 DIAGNOSIS — E1122 Type 2 diabetes mellitus with diabetic chronic kidney disease: Secondary | ICD-10-CM | POA: Diagnosis present

## 2020-05-01 DIAGNOSIS — Z20822 Contact with and (suspected) exposure to covid-19: Secondary | ICD-10-CM | POA: Diagnosis present

## 2020-05-01 DIAGNOSIS — G9341 Metabolic encephalopathy: Secondary | ICD-10-CM | POA: Diagnosis not present

## 2020-05-01 DIAGNOSIS — D696 Thrombocytopenia, unspecified: Secondary | ICD-10-CM | POA: Diagnosis present

## 2020-05-01 DIAGNOSIS — R4182 Altered mental status, unspecified: Secondary | ICD-10-CM

## 2020-05-01 DIAGNOSIS — E1129 Type 2 diabetes mellitus with other diabetic kidney complication: Secondary | ICD-10-CM | POA: Diagnosis present

## 2020-05-01 DIAGNOSIS — F17220 Nicotine dependence, chewing tobacco, uncomplicated: Secondary | ICD-10-CM | POA: Diagnosis present

## 2020-05-01 LAB — URINALYSIS, COMPLETE (UACMP) WITH MICROSCOPIC
Bilirubin Urine: NEGATIVE
Glucose, UA: NEGATIVE mg/dL
Hgb urine dipstick: NEGATIVE
Ketones, ur: NEGATIVE mg/dL
Leukocytes,Ua: NEGATIVE
Nitrite: NEGATIVE
Protein, ur: 30 mg/dL — AB
Specific Gravity, Urine: 1.015 (ref 1.005–1.030)
pH: 5 (ref 5.0–8.0)

## 2020-05-01 LAB — CBC WITH DIFFERENTIAL/PLATELET
Abs Immature Granulocytes: 0.04 10*3/uL (ref 0.00–0.07)
Basophils Absolute: 0.1 10*3/uL (ref 0.0–0.1)
Basophils Relative: 1 %
Eosinophils Absolute: 0 10*3/uL (ref 0.0–0.5)
Eosinophils Relative: 0 %
HCT: 30.9 % — ABNORMAL LOW (ref 36.0–46.0)
Hemoglobin: 10.5 g/dL — ABNORMAL LOW (ref 12.0–15.0)
Immature Granulocytes: 0 %
Lymphocytes Relative: 8 %
Lymphs Abs: 1 10*3/uL (ref 0.7–4.0)
MCH: 28.1 pg (ref 26.0–34.0)
MCHC: 34 g/dL (ref 30.0–36.0)
MCV: 82.6 fL (ref 80.0–100.0)
Monocytes Absolute: 1.2 10*3/uL — ABNORMAL HIGH (ref 0.1–1.0)
Monocytes Relative: 10 %
Neutro Abs: 9.7 10*3/uL — ABNORMAL HIGH (ref 1.7–7.7)
Neutrophils Relative %: 81 %
Platelets: 173 10*3/uL (ref 150–400)
RBC: 3.74 MIL/uL — ABNORMAL LOW (ref 3.87–5.11)
RDW: 15.1 % (ref 11.5–15.5)
WBC: 12 10*3/uL — ABNORMAL HIGH (ref 4.0–10.5)
nRBC: 0 % (ref 0.0–0.2)

## 2020-05-01 LAB — COMPREHENSIVE METABOLIC PANEL
ALT: 13 U/L (ref 0–44)
AST: 20 U/L (ref 15–41)
Albumin: 3.1 g/dL — ABNORMAL LOW (ref 3.5–5.0)
Alkaline Phosphatase: 53 U/L (ref 38–126)
Anion gap: 11 (ref 5–15)
BUN: 33 mg/dL — ABNORMAL HIGH (ref 8–23)
CO2: 23 mmol/L (ref 22–32)
Calcium: 8.6 mg/dL — ABNORMAL LOW (ref 8.9–10.3)
Chloride: 101 mmol/L (ref 98–111)
Creatinine, Ser: 1.11 mg/dL — ABNORMAL HIGH (ref 0.44–1.00)
GFR calc Af Amer: 52 mL/min — ABNORMAL LOW (ref >60)
GFR calc non Af Amer: 45 mL/min — ABNORMAL LOW (ref >60)
Glucose, Bld: 259 mg/dL — ABNORMAL HIGH (ref 70–99)
Potassium: 3.4 mmol/L — ABNORMAL LOW (ref 3.5–5.1)
Sodium: 135 mmol/L (ref 135–145)
Total Bilirubin: 1.3 mg/dL — ABNORMAL HIGH (ref 0.3–1.2)
Total Protein: 6.8 g/dL (ref 6.5–8.1)

## 2020-05-01 LAB — LACTIC ACID, PLASMA
Lactic Acid, Venous: 2.6 mmol/L (ref 0.5–1.9)
Lactic Acid, Venous: 2.8 mmol/L (ref 0.5–1.9)
Lactic Acid, Venous: 1.7 mmol/L (ref 0.5–1.9)
Lactic Acid, Venous: 2.3 mmol/L (ref 0.5–1.9)

## 2020-05-01 LAB — SARS CORONAVIRUS 2 BY RT PCR (HOSPITAL ORDER, PERFORMED IN ~~LOC~~ HOSPITAL LAB): SARS Coronavirus 2: NEGATIVE

## 2020-05-01 LAB — TROPONIN I (HIGH SENSITIVITY)
Troponin I (High Sensitivity): 16 ng/L (ref <18)
Troponin I (High Sensitivity): 14 ng/L (ref <18)

## 2020-05-01 LAB — HEMOGLOBIN A1C
Hgb A1c MFr Bld: 6.1 % — ABNORMAL HIGH (ref 4.8–5.6)
Mean Plasma Glucose: 128.37 mg/dL

## 2020-05-01 LAB — PROCALCITONIN: Procalcitonin: 0.15 ng/mL

## 2020-05-01 LAB — GLUCOSE, CAPILLARY: Glucose-Capillary: 159 mg/dL — ABNORMAL HIGH (ref 70–99)

## 2020-05-01 MED ORDER — ASPIRIN EC 325 MG PO TBEC
325.0000 mg | DELAYED_RELEASE_TABLET | Freq: Every day | ORAL | Status: DC
  Administered 2020-05-02 – 2020-05-04 (×3): 325 mg via ORAL
  Filled 2020-05-01 (×3): qty 1

## 2020-05-01 MED ORDER — SODIUM CHLORIDE 0.9 % IV SOLN
1.0000 g | INTRAVENOUS | Status: DC
  Administered 2020-05-02 – 2020-05-04 (×3): 1 g via INTRAVENOUS
  Filled 2020-05-01 (×2): qty 10
  Filled 2020-05-01: qty 1

## 2020-05-01 MED ORDER — VITAMIN D 25 MCG (1000 UNIT) PO TABS
1000.0000 [IU] | ORAL_TABLET | Freq: Every day | ORAL | Status: DC
  Administered 2020-05-02 – 2020-05-04 (×3): 1000 [IU] via ORAL
  Filled 2020-05-01 (×3): qty 1

## 2020-05-01 MED ORDER — ENOXAPARIN SODIUM 40 MG/0.4ML ~~LOC~~ SOLN
40.0000 mg | SUBCUTANEOUS | Status: DC
  Administered 2020-05-01 – 2020-05-03 (×3): 40 mg via SUBCUTANEOUS
  Filled 2020-05-01 (×3): qty 0.4

## 2020-05-01 MED ORDER — SODIUM CHLORIDE 0.9 % IV SOLN: INTRAVENOUS | Status: DC

## 2020-05-01 MED ORDER — AMLODIPINE BESYLATE 10 MG PO TABS
10.0000 mg | ORAL_TABLET | Freq: Every day | ORAL | Status: DC
  Administered 2020-05-02 – 2020-05-04 (×3): 10 mg via ORAL
  Filled 2020-05-01: qty 1
  Filled 2020-05-01: qty 2
  Filled 2020-05-01: qty 1

## 2020-05-01 MED ORDER — ONDANSETRON HCL 4 MG PO TABS: 4.0000 mg | ORAL_TABLET | Freq: Four times a day (QID) | ORAL | Status: DC | PRN

## 2020-05-01 MED ORDER — ACETAMINOPHEN 650 MG RE SUPP: 650.0000 mg | Freq: Four times a day (QID) | RECTAL | Status: DC | PRN

## 2020-05-01 MED ORDER — POTASSIUM CHLORIDE CRYS ER 20 MEQ PO TBCR
20.0000 meq | EXTENDED_RELEASE_TABLET | Freq: Once | ORAL | Status: AC
  Administered 2020-05-01: 20 meq via ORAL
  Filled 2020-05-01: qty 1

## 2020-05-01 MED ORDER — ONDANSETRON HCL 4 MG/2ML IJ SOLN: 4.0000 mg | Freq: Four times a day (QID) | INTRAMUSCULAR | Status: DC | PRN

## 2020-05-01 MED ORDER — SODIUM CHLORIDE 0.9 % IV SOLN
1.0000 g | Freq: Once | INTRAVENOUS | Status: AC
  Administered 2020-05-01: 1 g via INTRAVENOUS
  Filled 2020-05-01: qty 1

## 2020-05-01 MED ORDER — CARVEDILOL 12.5 MG PO TABS
12.5000 mg | ORAL_TABLET | Freq: Two times a day (BID) | ORAL | Status: DC
  Administered 2020-05-02 – 2020-05-04 (×5): 12.5 mg via ORAL
  Filled 2020-05-01 (×2): qty 1
  Filled 2020-05-01: qty 2
  Filled 2020-05-01 (×2): qty 1

## 2020-05-01 MED ORDER — SODIUM CHLORIDE 0.9 % IV BOLUS
1000.0000 mL | Freq: Once | INTRAVENOUS | Status: AC
  Administered 2020-05-01: 1000 mL via INTRAVENOUS

## 2020-05-01 MED ORDER — INSULIN ASPART 100 UNIT/ML ~~LOC~~ SOLN
0.0000 [IU] | Freq: Three times a day (TID) | SUBCUTANEOUS | Status: DC
  Administered 2020-05-02: 1 [IU] via SUBCUTANEOUS
  Administered 2020-05-03: 2 [IU] via SUBCUTANEOUS
  Administered 2020-05-04: 1 [IU] via SUBCUTANEOUS
  Administered 2020-05-04: 2 [IU] via SUBCUTANEOUS
  Filled 2020-05-01 (×4): qty 1

## 2020-05-01 MED ORDER — SODIUM CHLORIDE 0.9 % IV SOLN: Freq: Once | INTRAVENOUS | Status: AC

## 2020-05-01 MED ORDER — INSULIN ASPART 100 UNIT/ML ~~LOC~~ SOLN: 0.0000 [IU] | Freq: Every day | SUBCUTANEOUS | Status: DC

## 2020-05-01 MED ORDER — ATORVASTATIN CALCIUM 20 MG PO TABS
40.0000 mg | ORAL_TABLET | Freq: Every day | ORAL | Status: DC
  Administered 2020-05-01 – 2020-05-04 (×4): 40 mg via ORAL
  Filled 2020-05-01 (×4): qty 2

## 2020-05-01 MED ORDER — ACETAMINOPHEN 325 MG PO TABS: 650.0000 mg | ORAL_TABLET | Freq: Four times a day (QID) | ORAL | Status: DC | PRN

## 2020-05-01 NOTE — H&P (Signed)
History and Physical    Kim Roy ZOX:[id number] DOB: 1931-01-27 DOA: 05/01/2020  Referring MD/NP/PA:   PCP: [facility]   Patient coming from:  The patient is coming from SNF.  At baseline, pt is dependent for most of ADL.        Chief Complaint: AMS and syncope  HPI: Kim Roy is a 86 y.o. female with medical history significant of hypertension, hyperlipidemia, diabetes mellitus, CKD-3, who presents with altered mental status and syncope.  Per report, patient was noted to be confused in facility.  Normally patient is orientated x3 per her daughter.  She had one episode of syncope and passed out in the facility.  She has generalized weakness.  No facial droop or slurred speech.  Patient does not seem to have chest pain, shortness breath.  She has mild dry cough.  No fever or chills.  Patient has mild lower abdominal pain, but no tenderness on examination.  No active nausea, vomiting, diarrhea noted.  Not sure if patient has symptoms of UTI.  ED Course: pt was found to have WBC 12.0, lactic acid 2.6, 2.8, urinalysis with many bacteria (no leukocyte), negative Covid PCR, slightly worsening renal function, temperature 99, blood pressure 127/64, heart rate 79, RR 17, oxygen saturation 94-99% on room air.  MRI is negative for stroke.  Patient is placed on MedSurg bed for position  Review of Systems: Could not reviewed due to altered mental status  Allergy: No Known Allergies  Past Medical History:  Diagnosis Date  . Diabetes mellitus without complication (HCC)   . HTN (hypertension)     Past Surgical History:  Procedure Laterality Date  . FRACTURE SURGERY    . HIP PINNING,CANNULATED Left 01/20/2019   Procedure: CANNULATED HIP PINNING;  Surgeon: Kennedy Bucker, MD;  Location: [facility] ORS;  Service: Orthopedics;  Laterality: Left;    Social History:  reports that she has never smoked. Her smokeless tobacco use includes snuff. She reports that  she does not drink alcohol and does not use drugs.  Family History: Could not reviewed due to altered mental status.  Prior to Admission medications   Medication Sig Start Date End Date Taking? Authorizing Provider  amLODipine (NORVASC) 10 MG tablet Take 10 mg by mouth daily.   Yes [provider]  aspirin EC 325 MG EC tablet Take 1 tablet (325 mg total) by mouth daily with breakfast. 01/23/19  Yes Evon Slack, PA-C  carvedilol (COREG) 12.5 MG tablet Take 12.5 mg by mouth 2 (two) times a day. 04/20/15  Yes [provider]  Cholecalciferol 25 MCG (1000 UT) tablet Take 1,000 Units by mouth daily.   Yes [provider]  lisinopril-hydrochlorothiazide (ZESTORETIC) 20-25 MG tablet Take 1 tablet by mouth daily. 04/28/15  Yes [provider]  metFORMIN (GLUCOPHAGE) 1000 MG tablet Take 1,000 mg by mouth daily. 04/07/15  Yes [provider]  acetaminophen (TYLENOL) 325 MG tablet Take 2 tablets (650 mg total) by mouth every 6 (six) hours as needed for mild pain (or Fever >/= 101). 01/23/19   Auburn Bilberry, MD  atorvastatin (LIPITOR) 40 MG tablet Take 40 mg by mouth daily. 01/12/19   [provider]  cetirizine (ZYRTEC) 10 MG tablet Take 10 mg by mouth daily. Patient not taking: Reported on 05/01/2020    [provider]  docusate sodium (COLACE) 100 MG capsule Take 1 capsule (100 mg total) by mouth 2 (two) times daily. Patient not taking: Reported on 05/01/2020  01/23/19   Auburn Bilberry, MD  glipiZIDE (GLUCOTROL) 10 MG tablet Take 10 mg by mouth 2 (two) times a day. Patient not taking: Reported on 05/01/2020 01/16/19   [provider]  HYDROcodone-acetaminophen (NORCO/VICODIN) 5-325 MG tablet Take 1 tablet by mouth every 4 (four) hours as needed for moderate pain (pain score 4-6). Patient not taking: Reported on 05/01/2020 01/22/19   Evon Slack, [place]    Physical Exam: Vitals:   05/01/20 1630 05/01/20 1700 05/01/20 1730 05/01/20 1800    BP: (!) 122/56 128/73 (!) 113/52 124/71  Pulse: 77 80 78 77  Resp: 18 (!) 21 10 14   Temp:      TempSrc:      SpO2: 100% 100% 99% 99%   General: Not in acute distress HEENT:       Eyes: PERRL, EOMI, no scleral icterus.       ENT: No discharge from the ears and nose, no pharynx injection, no tonsillar enlargement.        Neck: No JVD, no bruit, no mass felt. Heme: No neck lymph node enlargement. Cardiac: S1/S2, RRR, No murmurs, No gallops or rubs. Respiratory: No rales, wheezing, rhonchi or rubs. GI: Soft, nondistended, nontender, no organomegaly, BS present. GU: No hematuria Ext: No pitting leg edema bilaterally. 2+DP/PT pulse bilaterally. Musculoskeletal: No joint deformities, No joint redness or warmth, no limitation of ROM in spin. Skin: No rashes.  Neuro confused, knows her own name, is not oriented to time and place.  Cranial nerves II-XII grossly intact, moves all extremities normally. Psych: Patient is not psychotic, no suicidal or hemocidal ideation.  Labs on Admission: I have personally reviewed following labs and imaging studies  CBC: Recent Labs  Lab 05/01/20 1036  WBC 12.0*  NEUTROABS 9.7*  HGB 10.5*  HCT 30.9*  MCV 82.6  PLT 173   Basic Metabolic Panel: Recent Labs  Lab 05/01/20 1036  NA 135  K 3.4*  CL 101  CO2 23  GLUCOSE 259*  BUN 33*  CREATININE 1.11*  CALCIUM 8.6*   GFR: CrCl cannot be calculated (Unknown ideal weight.). Liver Function Tests: Recent Labs  Lab 05/01/20 1036  AST 20  ALT 13  ALKPHOS 53  BILITOT 1.3*  PROT 6.8  ALBUMIN 3.1*   No results for input(s): LIPASE, AMYLASE in the last 168 hours. No results for input(s): AMMONIA in the last 168 hours. Coagulation Profile: No results for input(s): INR, PROTIME in the last 168 hours. Cardiac Enzymes: No results for input(s): CKTOTAL, CKMB, CKMBINDEX, TROPONINI in the last 168 hours. BNP (last 3 results) No results for input(s): PROBNP in the last 8760 hours. HbA1C: No  results for input(s): HGBA1C in the last 72 hours. CBG: No results for input(s): GLUCAP in the last 168 hours. Lipid Profile: No results for input(s): CHOL, HDL, LDLCALC, TRIG, CHOLHDL, LDLDIRECT in the last 72 hours. Thyroid Function Tests: No results for input(s): TSH, T4TOTAL, FREET4, T3FREE, THYROIDAB in the last 72 hours. Anemia Panel: No results for input(s): VITAMINB12, FOLATE, FERRITIN, TIBC, IRON, RETICCTPCT in the last 72 hours. Urine analysis:    Component Value Date/Time   COLORURINE AMBER (A) 05/01/2020 1038   APPEARANCEUR CLOUDY (A) 05/01/2020 1038   LABSPEC 1.015 05/01/2020 1038   PHURINE 5.0 05/01/2020 1038   GLUCOSEU NEGATIVE 05/01/2020 1038   HGBUR NEGATIVE 05/01/2020 1038   BILIRUBINUR NEGATIVE 05/01/2020 1038   KETONESUR NEGATIVE 05/01/2020 1038   PROTEINUR 30 (A) 05/01/2020 1038   NITRITE NEGATIVE 05/01/2020 1038   LEUKOCYTESUR  NEGATIVE 05/01/2020 1038   Sepsis Labs: @LABRCNTIP (procalcitonin:4,lacticidven:4) ) Recent Results (from the past 240 hour(s))  SARS Coronavirus 2 by RT PCR (hospital order, performed in [facility] hospital lab) Nasopharyngeal Nasopharyngeal Swab     Status: None   Collection Time: 05/01/20 10:39 AM   Specimen: Nasopharyngeal Swab  Result Value Ref Range Status   SARS Coronavirus 2 NEGATIVE NEGATIVE Final    Comment: (NOTE) SARS-CoV-2 target nucleic acids are NOT DETECTED.  The SARS-CoV-2 RNA is generally detectable in upper and lower respiratory specimens during the acute phase of infection. The lowest concentration of SARS-CoV-2 viral copies this assay can detect is 250 copies / mL. A negative result does not preclude SARS-CoV-2 infection and should not be used as the sole basis for treatment or other patient management decisions.  A negative result may occur with improper specimen collection / handling, submission of specimen other than nasopharyngeal swab, presence of viral mutation(s) within the areas targeted by this  assay, and inadequate number of viral copies (<250 copies / mL). A negative result must be combined with clinical observations, patient history, and epidemiological information.  Fact Sheet for Patients:   07/01/20  Fact Sheet for Healthcare Providers: BoilerBrush.com.cy  This test is not yet approved or  cleared by the https://pope.com/ FDA and has been authorized for detection and/or diagnosis of SARS-CoV-2 by FDA under an Emergency Use Authorization (EUA).  This EUA will remain in effect (meaning this test can be used) for the duration of the COVID-19 declaration under Section 564(b)(1) of the Act, 21 U.S.C. section 360bbb-3(b)(1), unless the authorization is terminated or revoked sooner.  Performed at [facility], [address] [place], [place]      Radiological Exams on Admission: CT Head Wo Contrast  Result Date: 05/01/2020 CLINICAL DATA:  Mental status change. EXAM: CT HEAD WITHOUT CONTRAST TECHNIQUE: Contiguous axial images were obtained from the base of the skull through the vertex without intravenous contrast. COMPARISON:  None. FINDINGS: Brain: Advanced white matter hypoattenuation, likely the sequela of chronic microvascular ischemic disease. There are multiple small hypodensities involving bilateral basal ganglia and thalami, compatible with age indeterminate lacunar infarcts. No evidence of acute large vascular territory infarction. No acute hemorrhage. Moderate diffuse cerebral atrophy with ex vacuo ventricular dilation. No hydrocephalus. Vascular: Calcific intracranial atherosclerosis. Skull: Normal. Negative for fracture or focal lesion. Sinuses/Orbits: No acute finding. IMPRESSION: 1. Multiple small hypodensities involving bilateral basal ganglia and thalami, compatible with age indeterminate lacunar infarcts. If there is concern for acute infarct, MRI could further evaluate. 2. Advanced presumed  chronic microvascular ischemic disease. Electronically Signed   By: 07/01/2020 MD   On: 05/01/2020 11:20   MR BRAIN WO CONTRAST  Result Date: 05/01/2020 CLINICAL DATA:  86 year old female became unresponsive. Altered mental status change. EXAM: MRI HEAD WITHOUT CONTRAST TECHNIQUE: Multiplanar, multiecho pulse sequences of the brain and surrounding structures were obtained without intravenous contrast. COMPARISON:  Head CT earlier today. FINDINGS: Brain: No restricted diffusion or evidence of acute infarction. Multiple chronic lacunar infarcts in the bilateral deep gray nuclei, the largest in the medial left thalamus. Widespread and confluent bilateral cerebral white matter T2 and FLAIR hyperintensity. Moderate similar signal heterogeneity in the pons. No cortical encephalomalacia. Possible chronic microhemorrhage in the right deep cerebellar nuclei. No midline shift, mass effect, evidence of mass lesion, ventriculomegaly, extra-axial collection or acute intracranial hemorrhage. Cervicomedullary junction and pituitary are within normal limits. Vascular: Major intracranial vascular flow voids are preserved. Skull and upper cervical  spine: Negative for age visible cervical spine, bone marrow signal. Sinuses/Orbits: Postoperative changes to both globes, otherwise negative orbits. Paranasal Visualized paranasal sinuses and mastoids are stable and well pneumatized. Other: Grossly normal visible internal auditory structures. Scalp and face soft tissues appear negative. IMPRESSION: 1. No acute intracranial abnormality. 2. 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 Electronically Signed   By: Odessa FlemingH  Hall M.D.   On: 05/01/2020 15:24   DG Chest Portable 1 View  Result Date: 05/01/2020 CLINICAL DATA:  Syncope EXAM: PORTABLE CHEST 1 VIEW COMPARISON:  Jan 19, 2019 FINDINGS: Stable interstitial thickening bilaterally,  primarily in the mid lung regions. No edema or airspace opacity. Heart is borderline enlarged with pulmonary vascularity normal. There is aortic atherosclerosis. No adenopathy. No bone lesions. IMPRESSION: Interstitial thickening, likely indicative of a degree of chronic bronchitis. No edema or airspace opacity. Stable cardiac prominence. No adenopathy appreciable. Aortic Atherosclerosis (ICD10-I70.0). Electronically Signed   By: Bretta BangWilliam  Woodruff III M.D.   On: 05/01/2020 10:53     EKG: Independently reviewed.  Sinus rhythm, QTC 457, LAE, PVC, poor R wave progression, LAD.  Assessment/Plan Principal Problem:   Syncope Active Problems:   Type II diabetes mellitus with renal manifestations (HCC)   HTN (hypertension)   UTI (urinary tract infection)   Acute metabolic encephalopathy   HLD (hyperlipidemia)   Hypokalemia   CKD (chronic kidney disease), stage IIIa   Syncope: Etiology is not clear.  MRI is negative for stroke.  Possibly due to dehydration and UTI. -Placed on MedSurg bed for position -Frequent neuro check -Treat UTI as below -IV fluid: 2 L normal saline, followed by 75 cc/h  Acute metabolic encephalopathy: Likely due to possible UTI -Frequent neuro check  Possible UTI (urinary tract infection): Urinalysis with many bacteria.  Patient has elevated lactic acid 2.6, 2.8, blood does not meet criteria for sepsis.  Currently hemodynamically stable -Rocephin IV (patient received 1 dose of cefepime by ED) -Follow-up blood culture and urine culture -IV fluid: 2 L normal saline, followed by 75 cc/h -trend lactic acid level  Type II diabetes mellitus with renal manifestations (HCC): No A1c on record.  Patient is taking Metformin and glipizide -Sliding scale insulin -Check A1c  HTN (hypertension) -Continue amlodipine, Coreg -Hold Zestoretic due to slightly worsening renal function  HLD (hyperlipidemia) -Lipitor  Hypokalemia -Repleted potassium -Check magnesium level  CKD  (chronic kidney disease), stage IIIa: Slightly worsening than baseline.  Baseline creatinine 0.82 on 01/22/2019.  Her creatinine is 1.11, BUN 33 -Hold Zestoretic -IV fluid as above    DVT ppx:  SQ Lovenox Code Status: Full code Family Communication:  Yes, patient's daughter   at bed side Disposition Plan:  Anticipate discharge back to previous SNF environment Consults called: None Admission status: Med-surg bed for obs   Status is: Observation  The patient remains OBS appropriate and will d/c before 2 midnights.  Dispo: The patient is from: SNF              Anticipated d/c is to: SNF              Anticipated d/c date is: 1 day              Patient currently is not medically stable to d/c.          Date of Service 05/01/2020    Lorretta HarpXilin Haniyah Maciolek [facility]   If 7PM-7AM, please contact night-coverage [email or web address]  05/01/2020, 6:45 PM

## 2020-05-01 NOTE — ED Triage Notes (Signed)
PER [facility], CALLED TO [facility] D/T PT BECOMING UNRESPONSIVE WHILE BEING ASSISTED TO THE BATHROOM. PER STAFF, PT IS USUALLY A&OX4. PT APPEARS LETHARGIC UPON ARRIVAL AND IS AROUSABLE TO NAME AND CAN ANSWER "YES/NO" QUESTIONS.

## 2020-05-01 NOTE — ED Provider Notes (Signed)
[facility] Emergency Department Provider Note   ____________________________________________   First MD Initiated Contact with Patient 05/01/20 1035     (approximate)  I have reviewed the triage vital signs and the nursing notes.   HISTORY  Chief Complaint Altered Mental Status, Weakness, and Loss of Consciousness  History limited by patient not speaking  HPI Kim Roy is a 86 y.o. female who was going to the bathroom.  She sat down on the toilet and passed out immediately.  She had not been straining.  She was assisted back to bed.  Initial blood pressure was in the 90s systolic.  She is now awake and alert but answering questions by nodding or shaking her head.  She is not speaking.  She is not trying to speak either.  Arms and legs are diffusely weak.  Patient denies feeling any fever or cough.  She denies any chest pain.  She says her belly is hurting a little bit but it is not tender when I push on it.  EMS reports fingerstick was over 300         Past Medical History:  Diagnosis Date  . Diabetes mellitus without complication (HCC)   . HTN (hypertension)     Patient Active Problem List   Diagnosis Date Noted  . Closed left hip fracture (HCC) 01/19/2019  . Diabetes (HCC) 01/19/2019  . HTN (hypertension) 01/19/2019    Past Surgical History:  Procedure Laterality Date  . FRACTURE SURGERY    . HIP PINNING,CANNULATED Left 01/20/2019   Procedure: CANNULATED HIP PINNING;  Surgeon: Kennedy Bucker, MD;  Location: [facility] ORS;  Service: Orthopedics;  Laterality: Left;    Prior to Admission medications   Medication Sig Start Date End Date Taking? Authorizing Provider  amLODipine (NORVASC) 10 MG tablet Take 10 mg by mouth daily.   Yes [provider]  aspirin EC 325 MG EC tablet Take 1 tablet (325 mg total) by mouth daily with breakfast. 01/23/19  Yes Evon Slack, PA-C  carvedilol (COREG) 12.5 MG tablet Take 12.5 mg by mouth 2  (two) times a day. 04/20/15  Yes [provider]  Cholecalciferol 25 MCG (1000 UT) tablet Take 1,000 Units by mouth daily.   Yes [provider]  lisinopril-hydrochlorothiazide (ZESTORETIC) 20-25 MG tablet Take 1 tablet by mouth daily. 04/28/15  Yes [provider]  metFORMIN (GLUCOPHAGE) 1000 MG tablet Take 1,000 mg by mouth daily. 04/07/15  Yes [provider]  acetaminophen (TYLENOL) 325 MG tablet Take 2 tablets (650 mg total) by mouth every 6 (six) hours as needed for mild pain (or Fever >/= 101). 01/23/19   Auburn Bilberry, MD  atorvastatin (LIPITOR) 40 MG tablet Take 40 mg by mouth daily. 01/12/19   [provider]  cetirizine (ZYRTEC) 10 MG tablet Take 10 mg by mouth daily. Patient not taking: Reported on 05/01/2020    [provider]  docusate sodium (COLACE) 100 MG capsule Take 1 capsule (100 mg total) by mouth 2 (two) times daily. Patient not taking: Reported on 05/01/2020 01/23/19   Auburn Bilberry, MD  glipiZIDE (GLUCOTROL) 10 MG tablet Take 10 mg by mouth 2 (two) times a day. Patient not taking: Reported on 05/01/2020 01/16/19   [provider]  HYDROcodone-acetaminophen (NORCO/VICODIN) 5-325 MG tablet Take 1 tablet by mouth every 4 (four) hours as needed for moderate pain (pain score 4-6). Patient not taking: Reported on 05/01/2020 01/22/19   Evon Slack, PA-C    Allergies Patient  has no known allergies.  History reviewed. No pertinent family history.  Social History Social History   Tobacco Use  . Smoking status: Never Smoker  . Smokeless tobacco: Current User    Types: Snuff  Substance Use Topics  . Alcohol use: No  . Drug use: No    Review of Systems  Constitutional: No fever/chills Eyes: No visual changes. ENT: No sore throat. Cardiovascular: Denies chest pain. Respiratory: Denies shortness of breath. Gastrointestinal: No abdominal pain.  No nausea, no vomiting.  No diarrhea.  No  constipation. Genitourinary: Negative for dysuria. Musculoskeletal: Negative for back pain. Skin: Negative for rash. Neurological: Negative for headaches, focal weakness or numbness.   ____________________________________________   PHYSICAL EXAM:  VITAL SIGNS: ED Triage Vitals [05/01/20 1035]  Enc Vitals Group     BP      Pulse      Resp      Temp      Temp src      SpO2      Weight      Height      Head Circumference      Peak Flow      Pain Score 0     Pain Loc      Pain Edu?      Excl. in GC?     Constitutional: Alert appears oriented.  Very weak. Eyes: Conjunctivae are normal. PER Head: Atraumatic. Nose: No congestion/rhinnorhea. Mouth/Throat: Mucous membranes are moist.  Oropharynx non-erythematous. Neck: No stridor.  No cervical spine tenderness to palpation. Cardiovascular: Normal rate, regular rhythm. Grossly normal heart sounds.  Good peripheral circulation. Respiratory: Normal respiratory effort.  No retractions. Lungs CTAB. Gastrointestinal: Soft and nontender. No distention. No abdominal bruits.  Musculoskeletal: No lower extremity tenderness nor edema.   Neurologic:   No gross focal neurologic deficits are appreciated.  Skin:  Skin is warm, dry and intact. No rash noted.   ____________________________________________   LABS (all labs ordered are listed, but only abnormal results are displayed)  Labs Reviewed  COMPREHENSIVE METABOLIC PANEL - Abnormal; Notable for the following components:      Result Value   Potassium 3.4 (*)    Glucose, Bld 259 (*)    BUN 33 (*)    Creatinine, Ser 1.11 (*)    Calcium 8.6 (*)    Albumin 3.1 (*)    Total Bilirubin 1.3 (*)    GFR calc non Af Amer 45 (*)    GFR calc Af Amer 52 (*)    All other components within normal limits  LACTIC ACID, PLASMA - Abnormal; Notable for the following components:   Lactic Acid, Venous 2.6 (*)    All other components within normal limits  LACTIC ACID, PLASMA - Abnormal; Notable  for the following components:   Lactic Acid, Venous 2.8 (*)    All other components within normal limits  CBC WITH DIFFERENTIAL/PLATELET - Abnormal; Notable for the following components:   WBC 12.0 (*)    RBC 3.74 (*)    Hemoglobin 10.5 (*)    HCT 30.9 (*)    Neutro Abs 9.7 (*)    Monocytes Absolute 1.2 (*)    All other components within normal limits  SARS CORONAVIRUS 2 BY RT PCR (HOSPITAL ORDER, PERFORMED IN [facility])  PROCALCITONIN  URINALYSIS, COMPLETE (UACMP) WITH MICROSCOPIC  TROPONIN I (HIGH SENSITIVITY)  TROPONIN I (HIGH SENSITIVITY)   ____________________________________________  EKG  EKG read and interpreted by me shows normal sinus rhythm rate of  91 left axis there is 1 premature contraction some nonspecific ST-T wave changes decreased R wave progression ____________________________________________  RADIOLOGY  ED MD interpretation: Chest x-ray read by radiology as haziness possible bronchitis.  I am also concerned about what appears to be some focal haziness in the right upper lobe that may be an infiltrate. CT of the head read as multiple small hypodensities in the basal ganglia and thalami consistent with age-indeterminate lacunar infarcts. Official radiology report(s): CT Head Wo Contrast  Result Date: 05/01/2020 CLINICAL DATA:  Mental status change. EXAM: CT HEAD WITHOUT CONTRAST TECHNIQUE: Contiguous axial images were obtained from the base of the skull through the vertex without intravenous contrast. COMPARISON:  None. FINDINGS: Brain: Advanced white matter hypoattenuation, likely the sequela of chronic microvascular ischemic disease. There are multiple small hypodensities involving bilateral basal ganglia and thalami, compatible with age indeterminate lacunar infarcts. No evidence of acute large vascular territory infarction. No acute hemorrhage. Moderate diffuse cerebral atrophy with ex vacuo ventricular dilation. No hydrocephalus. Vascular: Calcific  intracranial atherosclerosis. Skull: Normal. Negative for fracture or focal lesion. Sinuses/Orbits: No acute finding. IMPRESSION: 1. Multiple small hypodensities involving bilateral basal ganglia and thalami, compatible with age indeterminate lacunar infarcts. If there is concern for acute infarct, MRI could further evaluate. 2. Advanced presumed chronic microvascular ischemic disease. Electronically Signed   By: Feliberto Harts MD   On: 05/01/2020 11:20   MR BRAIN WO CONTRAST  Result Date: 05/01/2020 CLINICAL DATA:  86 year old female became unresponsive. Altered mental status change. EXAM: MRI HEAD WITHOUT CONTRAST TECHNIQUE: Multiplanar, multiecho pulse sequences of the brain and surrounding structures were obtained without intravenous contrast. COMPARISON:  Head CT earlier today. FINDINGS: Brain: No restricted diffusion or evidence of acute infarction. Multiple chronic lacunar infarcts in the bilateral deep gray nuclei, the largest in the medial left thalamus. Widespread and confluent bilateral cerebral white matter T2 and FLAIR hyperintensity. Moderate similar signal heterogeneity in the pons. No cortical encephalomalacia. Possible chronic microhemorrhage in the right deep cerebellar nuclei. No midline shift, mass effect, evidence of mass lesion, ventriculomegaly, extra-axial collection or acute intracranial hemorrhage. Cervicomedullary junction and pituitary are within normal limits. Vascular: Major intracranial vascular flow voids are preserved. Skull and upper cervical spine: Negative for age visible cervical spine, bone marrow signal. Sinuses/Orbits: Postoperative changes to both globes, otherwise negative orbits. Paranasal Visualized paranasal sinuses and mastoids are stable and well pneumatized. Other: Grossly normal visible internal auditory structures. Scalp and face soft tissues appear negative. IMPRESSION: 1. No acute intracranial abnormality. 2. 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 Electronically Signed   By: Odessa Fleming M.D.   On: 05/01/2020 15:24   DG Chest Portable 1 View  Result Date: 05/01/2020 CLINICAL DATA:  Syncope EXAM: PORTABLE CHEST 1 VIEW COMPARISON:  Jan 19, 2019 FINDINGS: Stable interstitial thickening bilaterally, primarily in the mid lung regions. No edema or airspace opacity. Heart is borderline enlarged with pulmonary vascularity normal. There is aortic atherosclerosis. No adenopathy. No bone lesions. IMPRESSION: Interstitial thickening, likely indicative of a degree of chronic bronchitis. No edema or airspace opacity. Stable cardiac prominence. No adenopathy appreciable. Aortic Atherosclerosis (ICD10-I70.0). Electronically Signed   By: Bretta Bang III M.D.   On: 05/01/2020 10:53    ____________________________________________   PROCEDURES  Procedure(s) performed (including Critical Care):  Procedures   ____________________________________________   INITIAL IMPRESSION / ASSESSMENT AND PLAN / ED COURSE  Patient's MRI shows no acute changes.  Patient however still altered not really speaking much.  We will give her some more fluids and try and get a urine but for now we will plan on getting her in the hospital.  Her lactic acid is elevated as is her white count.  I do not have a source at this time.  We will have to            ____________________________________________   FINAL CLINICAL IMPRESSION(S) / ED DIAGNOSES  Final diagnoses:  Altered mental status, unspecified altered mental status type     ED Discharge Orders    None      *Please note:  Alinda SierrasHattie Evans Ceci was evaluated in Emergency Department on 05/01/2020 for the symptoms described in the history of present illness. She was evaluated in the context of the global COVID-19 pandemic, which necessitated consideration that the patient might be at risk for infection with the  SARS-CoV-2 virus that causes COVID-19. Institutional protocols and algorithms that pertain to the evaluation of patients at risk for COVID-19 are in a state of rapid change based on information released by regulatory bodies including the CDC and federal and state organizations. These policies and algorithms were followed during the patient's care in the ED.  Some ED evaluations and interventions may be delayed as a result of limited staffing during and the pandemic.*   Note:  This document was prepared using Dragon voice recognition software and may include unintentional dictation errors.    Arnaldo NatalMalinda, Tovah Slavick F, MD 05/01/20 1539

## 2020-05-01 NOTE — ED Notes (Addendum)
Date and time results received: 05/01/20 11:26 AM  (use smartphrase ".now" to insert current time)  Test: lactic Critical Value: 2.6  Name of Provider Notified: Darnelle Catalan

## 2020-05-01 NOTE — ED Notes (Signed)
Lab called with critical result of Lactic Acid 2.3. Provider Manuela Schwartz notified.

## 2020-05-02 ENCOUNTER — Other Ambulatory Visit: Payer: Self-pay

## 2020-05-02 DIAGNOSIS — E785 Hyperlipidemia, unspecified: Secondary | ICD-10-CM

## 2020-05-02 DIAGNOSIS — R55 Syncope and collapse: Secondary | ICD-10-CM | POA: Diagnosis present

## 2020-05-02 DIAGNOSIS — Z7984 Long term (current) use of oral hypoglycemic drugs: Secondary | ICD-10-CM | POA: Diagnosis not present

## 2020-05-02 DIAGNOSIS — I129 Hypertensive chronic kidney disease with stage 1 through stage 4 chronic kidney disease, or unspecified chronic kidney disease: Secondary | ICD-10-CM | POA: Diagnosis present

## 2020-05-02 DIAGNOSIS — E1151 Type 2 diabetes mellitus with diabetic peripheral angiopathy without gangrene: Secondary | ICD-10-CM | POA: Diagnosis present

## 2020-05-02 DIAGNOSIS — Z7982 Long term (current) use of aspirin: Secondary | ICD-10-CM | POA: Diagnosis not present

## 2020-05-02 DIAGNOSIS — Z20822 Contact with and (suspected) exposure to covid-19: Secondary | ICD-10-CM | POA: Diagnosis present

## 2020-05-02 DIAGNOSIS — I10 Essential (primary) hypertension: Secondary | ICD-10-CM | POA: Diagnosis not present

## 2020-05-02 DIAGNOSIS — E871 Hypo-osmolality and hyponatremia: Secondary | ICD-10-CM | POA: Diagnosis present

## 2020-05-02 DIAGNOSIS — E876 Hypokalemia: Secondary | ICD-10-CM | POA: Diagnosis present

## 2020-05-02 DIAGNOSIS — E1122 Type 2 diabetes mellitus with diabetic chronic kidney disease: Secondary | ICD-10-CM | POA: Diagnosis present

## 2020-05-02 DIAGNOSIS — R3989 Other symptoms and signs involving the genitourinary system: Secondary | ICD-10-CM

## 2020-05-02 DIAGNOSIS — B9620 Unspecified Escherichia coli [E. coli] as the cause of diseases classified elsewhere: Secondary | ICD-10-CM | POA: Diagnosis present

## 2020-05-02 DIAGNOSIS — N1831 Chronic kidney disease, stage 3a: Secondary | ICD-10-CM

## 2020-05-02 DIAGNOSIS — G9341 Metabolic encephalopathy: Secondary | ICD-10-CM

## 2020-05-02 DIAGNOSIS — E860 Dehydration: Secondary | ICD-10-CM | POA: Diagnosis present

## 2020-05-02 DIAGNOSIS — Z79899 Other long term (current) drug therapy: Secondary | ICD-10-CM | POA: Diagnosis not present

## 2020-05-02 DIAGNOSIS — N390 Urinary tract infection, site not specified: Secondary | ICD-10-CM | POA: Diagnosis present

## 2020-05-02 DIAGNOSIS — R17 Unspecified jaundice: Secondary | ICD-10-CM | POA: Diagnosis present

## 2020-05-02 DIAGNOSIS — D696 Thrombocytopenia, unspecified: Secondary | ICD-10-CM | POA: Diagnosis present

## 2020-05-02 DIAGNOSIS — D649 Anemia, unspecified: Secondary | ICD-10-CM | POA: Diagnosis present

## 2020-05-02 DIAGNOSIS — F17220 Nicotine dependence, chewing tobacco, uncomplicated: Secondary | ICD-10-CM | POA: Diagnosis present

## 2020-05-02 LAB — CBC
HCT: 29 % — ABNORMAL LOW (ref 36.0–46.0)
Hemoglobin: 9.4 g/dL — ABNORMAL LOW (ref 12.0–15.0)
MCH: 28 pg (ref 26.0–34.0)
MCHC: 32.4 g/dL (ref 30.0–36.0)
MCV: 86.3 fL (ref 80.0–100.0)
Platelets: 146 10*3/uL — ABNORMAL LOW (ref 150–400)
RBC: 3.36 MIL/uL — ABNORMAL LOW (ref 3.87–5.11)
RDW: 15.2 % (ref 11.5–15.5)
WBC: 7.3 10*3/uL (ref 4.0–10.5)
nRBC: 0 % (ref 0.0–0.2)

## 2020-05-02 LAB — HEPATIC FUNCTION PANEL
ALT: 11 U/L (ref 0–44)
AST: 13 U/L — ABNORMAL LOW (ref 15–41)
Albumin: 2.7 g/dL — ABNORMAL LOW (ref 3.5–5.0)
Alkaline Phosphatase: 46 U/L (ref 38–126)
Bilirubin, Direct: 0.2 mg/dL (ref 0.0–0.2)
Indirect Bilirubin: 0.7 mg/dL (ref 0.3–0.9)
Total Bilirubin: 0.9 mg/dL (ref 0.3–1.2)
Total Protein: 6 g/dL — ABNORMAL LOW (ref 6.5–8.1)

## 2020-05-02 LAB — BASIC METABOLIC PANEL
Anion gap: 7 (ref 5–15)
BUN: 32 mg/dL — ABNORMAL HIGH (ref 8–23)
CO2: 24 mmol/L (ref 22–32)
Calcium: 8.6 mg/dL — ABNORMAL LOW (ref 8.9–10.3)
Chloride: 108 mmol/L (ref 98–111)
Creatinine, Ser: 0.94 mg/dL (ref 0.44–1.00)
GFR calc Af Amer: >60 mL/min (ref >60)
GFR calc non Af Amer: 55 mL/min — ABNORMAL LOW (ref >60)
Glucose, Bld: 120 mg/dL — ABNORMAL HIGH (ref 70–99)
Potassium: 4.2 mmol/L (ref 3.5–5.1)
Sodium: 139 mmol/L (ref 135–145)

## 2020-05-02 LAB — GLUCOSE, CAPILLARY
Glucose-Capillary: 107 mg/dL — ABNORMAL HIGH (ref 70–99)
Glucose-Capillary: 149 mg/dL — ABNORMAL HIGH (ref 70–99)
Glucose-Capillary: 133 mg/dL — ABNORMAL HIGH (ref 70–99)
Glucose-Capillary: 130 mg/dL — ABNORMAL HIGH (ref 70–99)

## 2020-05-02 LAB — MAGNESIUM: Magnesium: 1.7 mg/dL (ref 1.7–2.4)

## 2020-05-02 LAB — LACTIC ACID, PLASMA
Lactic Acid, Venous: 1.2 mmol/L (ref 0.5–1.9)
Lactic Acid, Venous: 1 mmol/L (ref 0.5–1.9)

## 2020-05-02 MED ORDER — SODIUM CHLORIDE 0.9 % IV BOLUS
250.0000 mL | Freq: Once | INTRAVENOUS | Status: AC
  Administered 2020-05-02: 250 mL via INTRAVENOUS

## 2020-05-02 MED ORDER — MAGNESIUM SULFATE 2 GM/50ML IV SOLN
2.0000 g | Freq: Once | INTRAVENOUS | Status: AC
  Administered 2020-05-02: 2 g via INTRAVENOUS
  Filled 2020-05-02: qty 50

## 2020-05-02 NOTE — ED Notes (Signed)
GLU [id number]

## 2020-05-02 NOTE — Progress Notes (Addendum)
PROGRESS NOTE    Paisly Fingerhut  XLK:[id number] DOB: 1931/03/09 DOA: 05/01/2020 PCP: [facility]   Brief Narrative:  HPI per Dr. Lorretta Harp on 05/02/20 HPI: Ceria Suminski is a 86 y.o. female with medical history significant of hypertension, hyperlipidemia, diabetes mellitus, CKD-3, who presents with altered mental status and syncope.  Per report, patient was noted to be confused in facility.  Normally patient is orientated x3 per her daughter.  She had one episode of syncope and passed out in the facility.  She has generalized weakness.  No facial droop or slurred speech.  Patient does not seem to have chest pain, shortness breath.  She has mild dry cough.  No fever or chills.  Patient has mild lower abdominal pain, but no tenderness on examination.  No active nausea, vomiting, diarrhea noted.  Not sure if patient has symptoms of UTI.  ED Course: pt was found to have WBC 12.0, lactic acid 2.6, 2.8, urinalysis with many bacteria (no leukocyte), negative Covid PCR, slightly worsening renal function, temperature 99, blood pressure 127/64, heart rate 79, RR 17, oxygen saturation 94-99% on room air.  MRI is negative for stroke.  Patient is placed on MedSurg bed for position  **Interim History Patient was withdrawn and wanting to sleep this morning.  Labs are improving.  She has been adequately hydrated we will continue IV fluid hydration for now and repeat blood work in the a.m.  Will await urine culture  Assessment & Plan:   Principal Problem:   Syncope Active Problems:   Type II diabetes mellitus with renal manifestations (HCC)   HTN (hypertension)   UTI (urinary tract infection)   Acute metabolic encephalopathy   HLD (hyperlipidemia)   Hypokalemia   CKD (chronic kidney disease), stage IIIa  Syncope -Etiology is not clear likely in the setting of dehydration and suspected UTI.   -Head CT on admission showed "Multiple small hypodensities involving  bilateral basal ganglia and thalami, compatible with age indeterminate lacunar infarcts. If there is concern for acute infarct, MRI could further evaluate. Advanced presumed chronic microvascular ischemic disease" -MRI is negative for stroke and official Read was  "No acute intracranial abnormality. Chronic small vessel disease with multiple chronic lacunar infarcts of the deep gray nuclei corresponding to the CT finding earlier today." -Placed on MedSurg as observation but will change to inpatient -Frequent neuro checks -Initial cardiac troponins were flat and negative with the initial one being 16 and the repeat being 14 -Treat UTI as below -Initial IV fluid given was 2 L normal saline, followed by 75 mL/hr; I gave an additional 250 mL bolus this morning -She will need PT and OT to evaluate and treat  Acute Metabolic Encephalopathy -Head and MRI as above -Likely due to possible UTI -Frequent neuro checks -Start Delirium precautions  Suspected UTI (Urinary Tract Infection) -Analysis on admission showed cloudy appearance with amber color urine, negative nitrite, negative ketones, negative leukocytes, many bacteria, 0-5 RBCs per high-power field, 0-5 squamous epithelial cells, 0-5 WBCs -Urine culture still currently pending -Patient had a leukocytosis on admission which could be from dehydration or also could be from infection and WBC went from 12.0 is now 7.3 -Empirically started on IV antibiotics with ceftriaxone and initially was given IV cefepime in the ED -Continue empiric antibiotic coverage for now -Initial lactic acid level was 2.6 on admission and trended up to 2.8 and then trended back down to 1.7 and repeat was then 2.3; have ordered repeat  lactic acid level 6-procalcitonin level was 0.15 on admission -Lactic acid level was elevated on admission and she is given 2 L normal saline in the ED followed by normal saline 75 MLS per hour and will continue for now -Blood cultures x2 are  still currently pending -Continue with supportive care antiemetics with p.o./IV Zofran every 6 as needed for nausea  Type II Diabetes Mellitus with Renal Manifestations (HCC):  -Patient is taking Metformin and glipizide at home and will currently hold these -Continue with sensitive NovoLog sliding scale insulin before meals and at bedtime -Checked HbA1c and was 6.1 -Continue monitor CBGs; CBG's ranging from 149-159 and blood sugar this morning on CMP was 120 -Adjust insulin regimen as necessary  HTN (Hypertension) -Continue Amlodipine 10 mg po Daily and Carvedilol 12.5 mg po BID -Currently holding Zestoretic due to slightly worsening renal function -Continue To to monitor blood pressures per protocol -Last blood pressure is 143/70  HLD (Hyperlipidemia) -C/w Atorvastatin 40 mg po qHS  AKI on CKD (chronic kidney disease), stage IIIa:  -Slightly worsening than baseline.   -Baseline creatinine 0.82 on 01/22/2019.  Her creatinine is 1.11, BUN 33 on admission an is now improving -BUN/Cr is now  -Avoid nephrotoxic medications, contrast dyes, hypotension and renally dose medications -Continue monitor and trend and repeat CMP in a.m.  Hyperbilirubinemia -Mild and improved  -T bili went from 1.3 on admission is now 0.9 -Continue monitor and trend and repeat CMP in a.m. -Continue with IV fluid hydration as above  Hypokalemia -Improved. K+ went from 3.4 -> 4.2 -Magnesium level was 1.7 and replete with IV mag sulfate 1 g -Continue to Monitor and Replete as Necessary -Repeat CMP in the AM  Normocytic Anemia -Patient's globin/hematocrit went from 10.5/30.9 is now 9.4/29.0 -Check anemia panel in the a.m. -Continue to monitor for signs and symptoms of bleeding; currently no overt bleeding noted -Repeat CBC in a.m.  Thrombocytopenia -Patient's platelet count went from 173 is now 146 -Continue monitor and trend and currently no signs and symptoms of bleeding -Repeat CBC in a.m.  DVT  prophylaxis: Enoxaparin 40 mg subcu every 24 Code Status: FULL CODE Family Communication: Discussed with Daughter-in-Law over the Telephone Disposition Plan: Pending PT OT evaluation and checking of orthostatic vital signs.  We will continue IV fluid hydration tonight as well as continuing empiric antibiotics.  Anticipating discharging next 0.4 to 48 hours if improved and will await urine culture results as well  Status is: Inpatient  Remains inpatient appropriate because:Unsafe d/c plan, IV treatments appropriate due to intensity of illness or inability to take PO and Inpatient level of care appropriate due to severity of illness   Dispo: The patient is from: SNF              Anticipated d/c is to: SNF              Anticipated d/c date is: 1-2 days              Patient currently is not medically stable to d/c.  Consultants:   None   Procedures:   None  Antimicrobials:  Anti-infectives (From admission, onward)   Start     Dose/Rate Route Frequency Ordered Stop   05/02/20 0600  cefTRIAXone (ROCEPHIN) 1 g in sodium chloride 0.9 % 100 mL IVPB        1 g 200 mL/hr over 30 Minutes Intravenous Every 24 hours 05/01/20 1822     05/01/20 1600  ceFEPIme (MAXIPIME) 1 g in sodium  chloride 0.9 % 100 mL IVPB        1 g 200 mL/hr over 30 Minutes Intravenous  Once 05/01/20 1558 05/01/20 1644       Subjective: Seen and examined at bedside and she was very withdrawn.  No nausea or vomiting and states that she is doing okay.  Wanted to sleep and had the covers over her head and was feeling cold.  No other concerns or complaints at this time.  Awaiting PT OT evaluation along with checking her orthostatic vital signs  Objective: Vitals:   05/02/20 0430 05/02/20 0500 05/02/20 0530 05/02/20 1536  BP: 135/65 133/66 (!) 149/62 (!) 143/70  Pulse: 71 71 74 80  Resp: 17 17 19 18   Temp:    98.4 F (36.9 C)  TempSrc:    Oral  SpO2: 99% 97% 98% 98%   No intake or output data in the 24 hours ending  05/02/20 1641 There were no vitals filed for this visit.  Examination: Physical Exam:  Constitutional: WN/WD elderly African-American female currently in NAD and appears calm and comfortable Eyes: Lids and conjunctivae normal, sclerae anicteric  ENMT: External Ears, Nose appear normal. Grossly normal hearing.  Neck: Appears normal, supple, no cervical masses, normal ROM, no appreciable thyromegaly: No JVD Respiratory: Diminished to auscultation bilaterally, no wheezing, rales, rhonchi or crackles. Normal respiratory effort and patient is not tachypenic. No accessory muscle use.  Unlabored breathing Cardiovascular: RRR, no murmurs / rubs / gallops. S1 and S2 auscultated.  Trace extremity edema Abdomen: Soft, non-tender, mildly distended. Bowel sounds positive.  GU: Deferred. Musculoskeletal: No clubbing / cyanosis of digits/nails. No joint deformity upper and lower extremities.   Skin: No rashes, lesions, ulcers or limited skin evaluation. No induration; Warm and dry.  Neurologic: CN 2-12 grossly intact with no focal deficits. Romberg sign and cerebellar reflexes not assessed.  Psychiatric: Slightly impaired judgment and insight.  She is a little somnolent and drowsy but easily arousable and wanting to rest and sleep. Normal mood and appropriate affect.   Data Reviewed: I have personally reviewed following labs and imaging studies  CBC: Recent Labs  Lab 05/01/20 1036 05/02/20 0649  WBC 12.0* 7.3  NEUTROABS 9.7*  --   HGB 10.5* 9.4*  HCT 30.9* 29.0*  MCV 82.6 86.3  PLT 173 146*   Basic Metabolic Panel: Recent Labs  Lab 05/01/20 1036 05/02/20 0649  NA 135 139  K 3.4* 4.2  CL 101 108  CO2 23 24  GLUCOSE 259* 120*  BUN 33* 32*  CREATININE 1.11* 0.94  CALCIUM 8.6* 8.6*  MG  --  1.7   GFR: CrCl cannot be calculated (Unknown ideal weight.). Liver Function Tests: Recent Labs  Lab 05/01/20 1036 05/02/20 0649  AST 20 13*  ALT 13 11  ALKPHOS 53 46  BILITOT 1.3* 0.9    PROT 6.8 6.0*  ALBUMIN 3.1* 2.7*   No results for input(s): LIPASE, AMYLASE in the last 168 hours. No results for input(s): AMMONIA in the last 168 hours. Coagulation Profile: No results for input(s): INR, PROTIME in the last 168 hours. Cardiac Enzymes: No results for input(s): CKTOTAL, CKMB, CKMBINDEX, TROPONINI in the last 168 hours. BNP (last 3 results) No results for input(s): PROBNP in the last 8760 hours. HbA1C: Recent Labs    05/01/20 1038  HGBA1C 6.1*   CBG: Recent Labs  Lab 05/01/20 2146 05/02/20 0931 05/02/20 1248 05/02/20 1631  GLUCAP 159* 107* 149* 133*   Lipid Profile: No results for input(s):  CHOL, HDL, LDLCALC, TRIG, CHOLHDL, LDLDIRECT in the last 72 hours. Thyroid Function Tests: No results for input(s): TSH, T4TOTAL, FREET4, T3FREE, THYROIDAB in the last 72 hours. Anemia Panel: No results for input(s): VITAMINB12, FOLATE, FERRITIN, TIBC, IRON, RETICCTPCT in the last 72 hours. Sepsis Labs: Recent Labs  Lab 05/01/20 1036 05/01/20 1316 05/01/20 1848 05/01/20 2227  PROCALCITON 0.15  --   --   --   LATICACIDVEN 2.6* 2.8* 1.7 2.3*    Recent Results (from the past 240 hour(s))  SARS Coronavirus 2 by RT PCR (hospital order, performed in [facility] hospital lab) Nasopharyngeal Nasopharyngeal Swab     Status: None   Collection Time: 05/01/20 10:39 AM   Specimen: Nasopharyngeal Swab  Result Value Ref Range Status   SARS Coronavirus 2 NEGATIVE NEGATIVE Final    Comment: (NOTE) SARS-CoV-2 target nucleic acids are NOT DETECTED.  The SARS-CoV-2 RNA is generally detectable in upper and lower respiratory specimens during the acute phase of infection. The lowest concentration of SARS-CoV-2 viral copies this assay can detect is 250 copies / mL. A negative result does not preclude SARS-CoV-2 infection and should not be used as the sole basis for treatment or other patient management decisions.  A negative result may occur with improper specimen collection /  handling, submission of specimen other than nasopharyngeal swab, presence of viral mutation(s) within the areas targeted by this assay, and inadequate number of viral copies (<250 copies / mL). A negative result must be combined with clinical observations, patient history, and epidemiological information.  Fact Sheet for Patients:   BoilerBrush.com.cy  Fact Sheet for Healthcare Providers: https://pope.com/  This test is not yet approved or  cleared by the [place] FDA and has been authorized for detection and/or diagnosis of SARS-CoV-2 by FDA under an Emergency Use Authorization (EUA).  This EUA will remain in effect (meaning this test can be used) for the duration of the COVID-19 declaration under Section 564(b)(1) of the Act, 21 U.S.C. section 360bbb-3(b)(1), unless the authorization is terminated or revoked sooner.  Performed at [facility], [address] [place], [place] [address]   CULTURE, BLOOD (ROUTINE X 2) w Reflex to ID Panel     Status: None (Preliminary result)   Collection Time: 05/01/20  6:23 PM   Specimen: BLOOD  Result Value Ref Range Status   Specimen Description BLOOD BLOOD LEFT WRIST  Final   Special Requests   Final    BOTTLES DRAWN AEROBIC AND ANAEROBIC Blood Culture results may not be optimal due to an inadequate volume of blood received in culture bottles   Culture   Final    NO GROWTH < 12 HOURS Performed at [facility], [address] [place], [place] [address]    Report Status PENDING  Incomplete  CULTURE, BLOOD (ROUTINE X 2) w Reflex to ID Panel     Status: None (Preliminary result)   Collection Time: 05/01/20  6:28 PM   Specimen: BLOOD  Result Value Ref Range Status   Specimen Description BLOOD LEFT ANTECUBITAL  Final   Special Requests   Final    BOTTLES DRAWN AEROBIC AND ANAEROBIC Blood Culture results may not be optimal due to an inadequate volume of blood received in  culture bottles   Culture   Final    NO GROWTH < 12 HOURS Performed at [facility], [address] [place], [place] [address]    Report Status PENDING  Incomplete    RN Pressure Injury Documentation:  Estimated body mass index is 21.63 kg/m as calculated from the following:   Height as of 01/19/19: 5\' 6"  (1.676 m).   Weight as of 01/19/19: 60.8 kg.  Malnutrition Type:      Malnutrition Characteristics:      Nutrition Interventions:    Radiology Studies: CT Head Wo Contrast  Result Date: 05/01/2020 CLINICAL DATA:  Mental status change. EXAM: CT HEAD WITHOUT CONTRAST TECHNIQUE: Contiguous axial images were obtained from the base of the skull through the vertex without intravenous contrast. COMPARISON:  None. FINDINGS: Brain: Advanced white matter hypoattenuation, likely the sequela of chronic microvascular ischemic disease. There are multiple small hypodensities involving bilateral basal ganglia and thalami, compatible with age indeterminate lacunar infarcts. No evidence of acute large vascular territory infarction. No acute hemorrhage. Moderate diffuse cerebral atrophy with ex vacuo ventricular dilation. No hydrocephalus. Vascular: Calcific intracranial atherosclerosis. Skull: Normal. Negative for fracture or focal lesion. Sinuses/Orbits: No acute finding. IMPRESSION: 1. Multiple small hypodensities involving bilateral basal ganglia and thalami, compatible with age indeterminate lacunar infarcts. If there is concern for acute infarct, MRI could further evaluate. 2. Advanced presumed chronic microvascular ischemic disease. Electronically Signed   By: Feliberto HartsFrederick S Jones MD   On: 05/01/2020 11:20   MR BRAIN WO CONTRAST  Result Date: 05/01/2020 CLINICAL DATA:  86 year old female became unresponsive. Altered mental status change. EXAM: MRI HEAD WITHOUT CONTRAST TECHNIQUE: Multiplanar, multiecho pulse sequences of the brain and surrounding structures were obtained without  intravenous contrast. COMPARISON:  Head CT earlier today. FINDINGS: Brain: No restricted diffusion or evidence of acute infarction. Multiple chronic lacunar infarcts in the bilateral deep gray nuclei, the largest in the medial left thalamus. Widespread and confluent bilateral cerebral white matter T2 and FLAIR hyperintensity. Moderate similar signal heterogeneity in the pons. No cortical encephalomalacia. Possible chronic microhemorrhage in the right deep cerebellar nuclei. No midline shift, mass effect, evidence of mass lesion, ventriculomegaly, extra-axial collection or acute intracranial hemorrhage. Cervicomedullary junction and pituitary are within normal limits. Vascular: Major intracranial vascular flow voids are preserved. Skull and upper cervical spine: Negative for age visible cervical spine, bone marrow signal. Sinuses/Orbits: Postoperative changes to both globes, otherwise negative orbits. Paranasal Visualized paranasal sinuses and mastoids are stable and well pneumatized. Other: Grossly normal visible internal auditory structures. Scalp and face soft tissues appear negative. IMPRESSION: 1. No acute intracranial abnormality. 2. 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 Electronically Signed   By: Odessa FlemingH  Hall M.D.   On: 05/01/2020 15:24   DG Chest Portable 1 View  Result Date: 05/01/2020 CLINICAL DATA:  Syncope EXAM: PORTABLE CHEST 1 VIEW COMPARISON:  Jan 19, 2019 FINDINGS: Stable interstitial thickening bilaterally, primarily in the mid lung regions. No edema or airspace opacity. Heart is borderline enlarged with pulmonary vascularity normal. There is aortic atherosclerosis. No adenopathy. No bone lesions. IMPRESSION: Interstitial thickening, likely indicative of a degree of chronic bronchitis. No edema or airspace opacity. Stable cardiac prominence. No adenopathy appreciable. Aortic Atherosclerosis  (ICD10-I70.0). Electronically Signed   By: Bretta BangWilliam  Woodruff III M.D.   On: 05/01/2020 10:53   Scheduled Meds: . amLODipine  10 mg Oral Daily  . aspirin  325 mg Oral Q breakfast  . atorvastatin  40 mg Oral Daily  . carvedilol  12.5 mg Oral BID WC  . cholecalciferol  1,000 Units Oral Daily  . enoxaparin (LOVENOX) injection  40 mg Subcutaneous Q24H  . insulin aspart  0-5 Units Subcutaneous  QHS  . insulin aspart  0-9 Units Subcutaneous TID WC   Continuous Infusions: . sodium chloride 75 mL/hr at 05/02/20 1557  . cefTRIAXone (ROCEPHIN)  IV Stopped (05/02/20 8938)    LOS: 0 days   Merlene Laughter, DO [facility] PAGER is on AMION  If 7PM-7AM, please contact night-coverage [email or web address]

## 2020-05-02 NOTE — ED Notes (Signed)
Phlebotomist at bedside.

## 2020-05-02 NOTE — Evaluation (Signed)
Physical Therapy Evaluation Patient Details Name: Kim Roy MRN: [id number] DOB: April 14, 1931 Today's Date: 05/02/2020   History of Present Illness  Pt from [facility] with increased confusion and syncopal episode. Known DM2, and HTN. Per daughter-in-law in room patient is WC bond at baseline, using RW for stand pivot transfers, and a few steps on occassion. DIL reports her and husband assist patient in/out of car.  Clinical Impression  Pt is modA for all transfers supine <>sit, STS, though she is unable to complete full stand with RW. Patient is able to complete enough stand to most likely participate in stand pivot to [facility] with modA (chair not available at evaluation). Patient is unable to maintain sitting balance without support despite effort with UEs for >30sec. PT recommendation is return to SNF with PT/OT. Would benefit from skilled PT to address above deficits and promote optimal return to PLOF.     Follow Up Recommendations SNF    Equipment Recommendations  Rolling walker with 5" wheels    Recommendations for Other Services       Precautions / Restrictions Precautions Precautions: Fall      Mobility  Bed Mobility Overal bed mobility: Needs Assistance Bed Mobility: Supine to Sit;Sit to Supine     Supine to sit: Mod assist Sit to supine: Mod assist   General bed mobility comments: assistance needed d/t weakness BLE and trunk support, good carry over of cuing for technique efficiency  Transfers Overall transfer level: Needs assistance Equipment used: Rolling walker (2 wheeled) Transfers: Sit to/from Stand Sit to Stand: Mod assist         General transfer comment: Patient unable to complete full stand, able to stand enough to most likely complete stand pivot transfer if chair was availible modA  Ambulation/Gait             General Gait Details: deferred  Stairs            Wheelchair Mobility    Modified Rankin (Stroke Patients Only)        Balance Overall balance assessment: Needs assistance Sitting-balance support: Bilateral upper extremity supported Sitting balance-Leahy Scale: Poor Sitting balance - Comments: unable to maintain upright sitting without support                                     Pertinent Vitals/Pain Pain Assessment: No/denies pain    Home Living Family/patient expects to be discharged to:: Private residence Living Arrangements: Other (Comment) (SNF Armed forces operational officer) Available Help at Discharge: Skilled Nursing Facility Type of Home: Assisted living Home Access: Ramped entrance     Home Layout: One level Home Equipment: Environmental consultant - 2 wheels;Bedside commode;Grab bars - tub/shower;Shower seat;Wheelchair - Fish farm manager Comments: Per DIL patient is mostly WC at baseline, completes stand pivot transfers and takes small "couple steps" occassionally    Prior Function Level of Independence: Independent with assistive device(s);Needs assistance   Gait / Transfers Assistance Needed: DIL reports patient is modA for car transfers, minA for supine > sit but able to sit EOB ind, and supervision for stand pivot transfers to [employer] chair           Hand Dominance        Extremity/Trunk Assessment   Upper Extremity Assessment Upper Extremity Assessment: Generalized weakness    Lower Extremity Assessment Lower Extremity Assessment: Generalized weakness    Cervical / Trunk Assessment Cervical / Trunk Assessment: Kyphotic  Communication      Cognition Arousal/Alertness: Awake/alert Behavior During Therapy: WFL for tasks assessed/performed Overall Cognitive Status: Within Functional Limits for tasks assessed                                        General Comments      Exercises Other Exercises Other Exercises: Supine <> sit modA with good carry over cuing to attempt to complete transfer, unable without modA d/t assistance Other  Exercises: Sitting EOB patient unable to maintain sitting balance with BUE support, able to maintain less than at a time Other Exercises: STS attempt with RW patient able to stand modA and maintain modA, unable to stand fully despite cuing   Assessment/Plan    PT Assessment Patient needs continued PT services  PT Problem List Decreased mobility;Pain;Decreased balance;Decreased activity tolerance;Decreased strength;Decreased range of motion;Decreased knowledge of use of DME       PT Treatment Interventions DME instruction;Balance training;Gait training;Neuromuscular re-education;Stair training;Functional mobility training;Patient/family education;Therapeutic activities;Therapeutic exercise;Manual techniques;Wheelchair mobility training    PT Goals (Current goals can be found in the Care Plan section)  Acute Rehab PT Goals Patient Stated Goal: go home PT Goal Formulation: With patient Time For Goal Achievement: 05/16/20 Potential to Achieve Goals: Fair    Frequency Min 2X/week   Barriers to discharge        Co-evaluation               AM-PAC PT "6 Clicks" Mobility  Outcome Measure Help needed turning from your back to your side while in a flat bed without using bedrails?: A Little Help needed moving from lying on your back to sitting on the side of a flat bed without using bedrails?: A Lot Help needed moving to and from a bed to a chair (including a wheelchair)?: A Lot Help needed standing up from a chair using your arms (e.g., wheelchair or bedside chair)?: A Lot Help needed to walk in hospital room?: Total Help needed climbing 3-5 steps with a railing? : Total 6 Click Score: 11    End of Session Equipment Utilized During Treatment: Gait belt Activity Tolerance: Patient tolerated treatment well Patient left: in bed;with call bell/phone within reach;with bed alarm set;with family/visitor present Nurse Communication: Mobility status PT Visit Diagnosis: Unsteadiness  on feet (R26.81);Other abnormalities of gait and mobility (R26.89);Difficulty in walking, not elsewhere classified (R26.2);Muscle weakness (generalized) (M62.81)    Time: 1572-6203 PT Time Calculation (min) (ACUTE ONLY): 24 min   Charges:   PT Evaluation $PT Eval Moderate Complexity: 1 Mod PT Treatments $Therapeutic Activity: 23-37 mins      Hilda Lias DPT  Hilda Lias 05/02/2020, 3:32 PM

## 2020-05-02 NOTE — ED Notes (Signed)
Pt blood GLU is 107.  Pt received breakfast tray. Tech helped pt set up her drinks and food with seasonings etc.  Pt seems to be following directions but is slow in her responds, which looks like she might not be understanding the directions.  Pt is laying in bed calm and staring at the desk from the bed.   lw edt

## 2020-05-02 NOTE — ED Notes (Signed)
Called lab to request a phlebotomist °

## 2020-05-03 DIAGNOSIS — I10 Essential (primary) hypertension: Secondary | ICD-10-CM

## 2020-05-03 DIAGNOSIS — N3000 Acute cystitis without hematuria: Secondary | ICD-10-CM

## 2020-05-03 DIAGNOSIS — E1121 Type 2 diabetes mellitus with diabetic nephropathy: Secondary | ICD-10-CM

## 2020-05-03 DIAGNOSIS — E871 Hypo-osmolality and hyponatremia: Secondary | ICD-10-CM

## 2020-05-03 LAB — CBC WITH DIFFERENTIAL/PLATELET
Abs Immature Granulocytes: 0.03 10*3/uL (ref 0.00–0.07)
Basophils Absolute: 0 10*3/uL (ref 0.0–0.1)
Basophils Relative: 1 %
Eosinophils Absolute: 0.1 10*3/uL (ref 0.0–0.5)
Eosinophils Relative: 1 %
HCT: 30.3 % — ABNORMAL LOW (ref 36.0–46.0)
Hemoglobin: 9.8 g/dL — ABNORMAL LOW (ref 12.0–15.0)
Immature Granulocytes: 0 %
Lymphocytes Relative: 14 %
Lymphs Abs: 1 10*3/uL (ref 0.7–4.0)
MCH: 27.7 pg (ref 26.0–34.0)
MCHC: 32.3 g/dL (ref 30.0–36.0)
MCV: 85.6 fL (ref 80.0–100.0)
Monocytes Absolute: 0.6 10*3/uL (ref 0.1–1.0)
Monocytes Relative: 9 %
Neutro Abs: 5.3 10*3/uL (ref 1.7–7.7)
Neutrophils Relative %: 75 %
Platelets: 179 10*3/uL (ref 150–400)
RBC: 3.54 MIL/uL — ABNORMAL LOW (ref 3.87–5.11)
RDW: 14.7 % (ref 11.5–15.5)
WBC: 7 10*3/uL (ref 4.0–10.5)
nRBC: 0 % (ref 0.0–0.2)

## 2020-05-03 LAB — COMPREHENSIVE METABOLIC PANEL
ALT: 12 U/L (ref 0–44)
AST: 14 U/L — ABNORMAL LOW (ref 15–41)
Albumin: 2.7 g/dL — ABNORMAL LOW (ref 3.5–5.0)
Alkaline Phosphatase: 53 U/L (ref 38–126)
Anion gap: 9 (ref 5–15)
BUN: 14 mg/dL (ref 8–23)
CO2: 23 mmol/L (ref 22–32)
Calcium: 8.5 mg/dL — ABNORMAL LOW (ref 8.9–10.3)
Chloride: 102 mmol/L (ref 98–111)
Creatinine, Ser: 0.65 mg/dL (ref 0.44–1.00)
GFR calc Af Amer: >60 mL/min (ref >60)
GFR calc non Af Amer: >60 mL/min (ref >60)
Glucose, Bld: 147 mg/dL — ABNORMAL HIGH (ref 70–99)
Potassium: 3.7 mmol/L (ref 3.5–5.1)
Sodium: 134 mmol/L — ABNORMAL LOW (ref 135–145)
Total Bilirubin: 0.7 mg/dL (ref 0.3–1.2)
Total Protein: 6.3 g/dL — ABNORMAL LOW (ref 6.5–8.1)

## 2020-05-03 LAB — MAGNESIUM: Magnesium: 1.6 mg/dL — ABNORMAL LOW (ref 1.7–2.4)

## 2020-05-03 LAB — GLUCOSE, CAPILLARY
Glucose-Capillary: 106 mg/dL — ABNORMAL HIGH (ref 70–99)
Glucose-Capillary: 158 mg/dL — ABNORMAL HIGH (ref 70–99)
Glucose-Capillary: 112 mg/dL — ABNORMAL HIGH (ref 70–99)
Glucose-Capillary: 173 mg/dL — ABNORMAL HIGH (ref 70–99)

## 2020-05-03 LAB — PHOSPHORUS: Phosphorus: 2.5 mg/dL (ref 2.5–4.6)

## 2020-05-03 MED ORDER — SODIUM CHLORIDE 0.9 % IV SOLN: INTRAVENOUS | Status: AC

## 2020-05-03 MED ORDER — MAGNESIUM SULFATE 2 GM/50ML IV SOLN
2.0000 g | Freq: Once | INTRAVENOUS | Status: AC
  Administered 2020-05-03: 2 g via INTRAVENOUS
  Filled 2020-05-03: qty 50

## 2020-05-03 NOTE — TOC Initial Note (Signed)
Transition of Care [facility]) - Initial/Assessment Note    Patient Details  Name: Azalie Harbeck MRN: [id number] Date of Birth: 1931-05-10  [facility] CM/SW Contact:    Maud Deed, LCSW Phone Number: 05/03/2020, 12:36 PM  Clinical Narrative:                 [facility] consulted for SNF placement. Pt is a LTC resident of [facility]. CSW contacted pt's daughter and she wants pt to return to [facility] and receive rehab there. CSW completed FL2 and faxed over to [facility].   TOC will continue to follow.   Expected Discharge Plan: Skilled Nursing Facility Barriers to Discharge: Continued Medical Work up   Patient Goals and CMS Choice Patient states their goals for this hospitalization and ongoing recovery are:: to gain strength back CMS Medicare.gov Compare Post Acute Care list provided to:: Patient Represenative (must comment) Choice offered to / list presented to : Adult Children  Expected Discharge Plan and Services Expected Discharge Plan: Skilled Nursing Facility     Post Acute Care Choice: Skilled Nursing Facility Living arrangements for the past 2 months: Skilled Nursing Facility                                      Prior Living Arrangements/Services Living arrangements for the past 2 months: Skilled Nursing Facility Lives with:: Facility Resident Patient language and need for interpreter reviewed:: Yes Do you feel safe going back to the place where you live?: Yes      Need for Family Participation in Patient Care: Yes (Comment) (disoriented at times) Care giver support system in place?: Yes (comment) (adult children)   Criminal Activity/Legal Involvement Pertinent to Current Situation/Hospitalization: No - Comment as needed  Activities of Daily Living Home Assistive Devices/Equipment: Wheelchair ADL Screening (condition at time of admission) Patient's cognitive ability adequate to safely complete daily activities?: No Is the patient deaf or have  difficulty hearing?: No Does the patient have difficulty seeing, even when wearing glasses/contacts?: No Does the patient have difficulty concentrating, remembering, or making decisions?: No Patient able to express need for assistance with ADLs?: Yes Does the patient have difficulty dressing or bathing?: Yes Independently performs ADLs?: No Feeding: Appropriate for developmental age Does the patient have difficulty walking or climbing stairs?: No Weakness of Legs: Both Weakness of Arms/Hands: None  Permission Sought/Granted Permission sought to share information with : Facility Industrial/product designer granted to share information with : Yes, Verbal Permission Granted  Share Information with NAME: Marylene Land     Permission granted to share info w Relationship: daughter  Permission granted to share info w Contact Information: [phone]  Emotional Assessment Appearance:: Other (Comment Required (unable to assess) Attitude/Demeanor/Rapport: Unable to Assess Affect (typically observed): Unable to Assess Orientation: : Oriented to Self, Oriented to Place, Oriented to Situation, Fluctuating Orientation (Suspected and/or reported Sundowners) Alcohol / Substance Use: Not Applicable Psych Involvement: No (comment)  Admission diagnosis:  Syncope [R55] UTI (urinary tract infection) [N39.0] Altered mental status, unspecified altered mental status type [R41.82] Patient Active Problem List   Diagnosis Date Noted  . Syncope 05/01/2020  . UTI (urinary tract infection) 05/01/2020  . Acute metabolic encephalopathy 05/01/2020  . HLD (hyperlipidemia) 05/01/2020  . Hypokalemia 05/01/2020  . CKD (chronic kidney disease), stage IIIa 05/01/2020  . Closed left hip fracture (HCC) 01/19/2019  . Type II diabetes mellitus with renal manifestations (HCC) 01/19/2019  .  HTN (hypertension) 01/19/2019   PCP:  [facility] Pharmacy:   [facility] [place] -  [place], [place] - [address] [place] [address] Phone: [phone] Fax: [phone]     Social Determinants of Health (SDOH) Interventions    Readmission Risk Interventions No flowsheet data found.

## 2020-05-03 NOTE — Progress Notes (Signed)
PROGRESS NOTE    Kim Roy  LSL:[id number] DOB: 1930/10/06 DOA: 05/01/2020 PCP: [facility]   Brief Narrative:  HPI per Dr. Lorretta Harp on 05/02/20 HPI: Kim Roy is a 86 y.o. female with medical history significant of hypertension, hyperlipidemia, diabetes mellitus, CKD-3, who presents with altered mental status and syncope.  Per report, patient was noted to be confused in facility.  Normally patient is orientated x3 per her daughter.  She had one episode of syncope and passed out in the facility.  She has generalized weakness.  No facial droop or slurred speech.  Patient does not seem to have chest pain, shortness breath.  She has mild dry cough.  No fever or chills.  Patient has mild lower abdominal pain, but no tenderness on examination.  No active nausea, vomiting, diarrhea noted.  Not sure if patient has symptoms of UTI.  ED Course: pt was found to have WBC 12.0, lactic acid 2.6, 2.8, urinalysis with many bacteria (no leukocyte), negative Covid PCR, slightly worsening renal function, temperature 99, blood pressure 127/64, heart rate 79, RR 17, oxygen saturation 94-99% on room air.  MRI is negative for stroke.  Patient is placed on MedSurg bed for position  **Interim History Patient was withdrawn and wanting to sleep yesterday morning and is a little sleepy again.  Labs are improving.  She has been adequately hydrated we will continue IV fluid hydration for now and repeat blood work in the a.m.  Will await urine culture but preliminary Results show >100,000 CFU of GNR. PT/OT recommending SNF and [facility] with 5" Wheels.   Assessment & Plan:   Principal Problem:   Syncope Active Problems:   Type II diabetes mellitus with renal manifestations (HCC)   HTN (hypertension)   UTI (urinary tract infection)   Acute metabolic encephalopathy   HLD (hyperlipidemia)   Hypokalemia   CKD (chronic kidney disease), stage IIIa  Syncope -Etiology  is not clear likely in the setting of dehydration and suspected UTI.   -Head CT on admission showed "Multiple small hypodensities involving bilateral basal ganglia and thalami, compatible with age indeterminate lacunar infarcts. If there is concern for acute infarct, MRI could further evaluate. Advanced presumed chronic microvascular ischemic disease" -MRI is negative for stroke and official Read was  "No acute intracranial abnormality. Chronic small vessel disease with multiple chronic lacunar infarcts of the deep gray nuclei corresponding to the CT finding earlier today." -Placed on MedSurg as observation but will change to inpatient -Frequent neuro checks -Initial cardiac troponins were flat and negative with the initial one being 16 and the repeat being 14 -Treat UTI as below -Initial IV fluid given was 2 L normal saline, followed by 75 mL/hr and will stop later today; I gave an additional 250 mL bolus yestereday morning -She will need PT and OT to evaluate and treat and they are recommending SNF  -Ordered orthostatic vital signs and these are still not been done yet  Acute Metabolic Encephalopathy -Head CT and MRI as above -Likely due to possible UTI -Frequent neuro checks -Start Delirium precautions  GNR UTI (Urinary Tract Infection) -Urinanalysis on admission showed cloudy appearance with amber color urine, negative nitrite, negative ketones, negative leukocytes, many bacteria, 0-5 RBCs per high-power field, 0-5 squamous epithelial cells, 0-5 WBCs -Urine culture still showing >100,000 CFU of GNR -Patient had a leukocytosis on admission which could be from dehydration or also could be from infection and WBC went from 12.0 is now  7.0 today  -Empirically started on IV antibiotics with ceftriaxone and initially was given IV cefepime in the ED -Continue empiric antibiotic coverage for now -Lactic acid level is now trended down to 1.0 after all the fluid hydration -Lactic acid level was  elevated on admission and she is given 2 L normal saline in the ED followed by normal saline 75 MLS per hour and will continue for now for 12 more hours -Blood cultures x2 are showing NGTD at 2 Days -Continue with supportive care antiemetics with p.o./IV Zofran every 6 as needed for nausea  Type II Diabetes Mellitus with Renal Manifestations (HCC):  -Patient is taking Metformin and glipizide at home and will currently hold these -Continue with sensitive NovoLog sliding scale insulin before meals and at bedtime -Checked HbA1c and was 6.1 -Continue monitor CBGs; CBG's ranging from 106-158 and blood sugar this morning on CMP was 147 -Adjust insulin regimen as necessary  HTN (Hypertension) -Continue Amlodipine 10 mg po Daily and Carvedilol 12.5 mg po BID -Currently holding Zestoretic due to slightly worsening renal function -Continue To to monitor blood pressures per protocol -Last blood pressure is 143/70  HLD (Hyperlipidemia) -C/w Atorvastatin 40 mg po qHS  AKI on CKD (chronic kidney disease), stage IIIa:  -Slightly worsening than baseline.   -Baseline creatinine 0.82 on 01/22/2019.  Her creatinine is 1.11, BUN 33 on admission an is now improving -BUN/Cr is now improved and is now 14/0.65 -Avoid nephrotoxic medications, contrast dyes, hypotension and renally dose medications -Continue monitor and trend and repeat CMP in a.m.  Hyperbilirubinemia -Mild and improved  -T bili went from 1.3 on admission is now 0.7 -Continue monitor and trend and repeat CMP in a.m. -Continue with IV fluid hydration as above  Hypokalemia -Improved. K+ went from 3.4 -> 4.2 -> 3.7 -Magnesium level was 1.6 this AM -Continue to Monitor and Replete as Necessary -Repeat CMP in the AM  Hypomagnesemia -Patient's Mag Level was 1.6 -Replete with IV Mag Sulfate 2 grams -Continue to Replete as Necessary -Repeat Mag Level in the AM   Hyponatremia -Mild with a Sodium of 134 -We will continue to monitor and  trend and continue with IV fluid hydration with normal saline at a rate of 75 MLS per hour -Continue to monitor and trend repeat CMP in a.m.  Normocytic Anemia -Patient's globin/hematocrit went from 10.5/30.9 is now 9.4/29.0 -> 9.8/30.3 -Check anemia panel in the a.m. -Continue to monitor for signs and symptoms of bleeding; currently no overt bleeding noted -Repeat CBC in a.m.  Thrombocytopenia -Patient's platelet count went from 173 is now 146 yesterday and today it is now 179 -Continue monitor and trend and currently no signs and symptoms of bleeding -Repeat CBC in a.m.  DVT prophylaxis: Enoxaparin 40 mg subcu every 24 Code Status: FULL CODE Family Communication: Discussed with Daughter-in-Law over the Telephone Disposition Plan: Pending PT OT evaluation and checking of orthostatic vital signs.  We will continue IV fluid hydration tonight as well as continuing empiric antibiotics.  Anticipating discharging next 24 to 48 hours if improved and will await urine culture results as well.  Urine culture growing greater than 100,000 colonies of gram negative rods  Status is: Inpatient  Remains inpatient appropriate because:Unsafe d/c plan, IV treatments appropriate due to intensity of illness or inability to take PO and Inpatient level of care appropriate due to severity of illness   Dispo: The patient is from: SNF              Anticipated  d/c is to: SNF              Anticipated d/c date is: 1-2 days              Patient currently is not medically stable to d/c.  Consultants:   None   Procedures:   None  Antimicrobials:  Anti-infectives (From admission, onward)   Start     Dose/Rate Route Frequency Ordered Stop   05/02/20 0600  cefTRIAXone (ROCEPHIN) 1 g in sodium chloride 0.9 % 100 mL IVPB        1 g 200 mL/hr over 30 Minutes Intravenous Every 24 hours 05/01/20 1822     05/01/20 1600  ceFEPIme (MAXIPIME) 1 g in sodium chloride 0.9 % 100 mL IVPB        1 g 200 mL/hr over 30  Minutes Intravenous  Once 05/01/20 1558 05/01/20 1644       Subjective: Seen and examined at bedside and she was sleepy this morning but easily arousable.  No complaints.  No chest pain, lightheadedness.  No other concerns or complaints at this time.  Objective: Vitals:   05/02/20 1722 05/02/20 2019 05/03/20 0103 05/03/20 0343  BP: (!) 147/62 (!) 144/64 (!) 167/91 (!) 157/86  Pulse: 83 76 89 85  Resp: Temp: 99.5 F (37.5 C) 98.3 F (36.8 C) 98.8 F (37.1 C) 99.8 F (37.7 C)  TempSrc: Oral Oral Oral Oral  SpO2: 94% 100% 99% 94%    Intake/Output Summary (Last 24 hours) at 05/03/2020 1344 Last data filed at 05/03/2020 0300 Gross per 24 hour  Intake 1927.14 ml  Output 400 ml  Net 1527.14 ml   There were no vitals filed for this visit.  Examination: Physical Exam:  Constitutional: WN/WD elderly African-American female currently no acute distress appears calm and is somnolent and drowsy but easily arousable. Eyes: Lids and conjunctivae normal, sclerae anicteric  ENMT: External Ears, Nose appear normal. Grossly normal hearing.  Neck: Appears normal, supple, no cervical masses, normal ROM, no appreciable thyromegaly; no JVD Respiratory: Diminished to auscultation bilaterally, no wheezing, rales, rhonchi or crackles. Normal respiratory effort and patient is not tachypenic. No accessory muscle use.  Unlabored breathing Cardiovascular: RRR, no murmurs / rubs / gallops. S1 and S2 auscultated.  Abdomen: Soft, non-tender, non-distended.  Bowel sounds positive.  GU: Deferred. Musculoskeletal: No clubbing / cyanosis of digits/nails. No joint deformity upper and lower extremities.  Skin: No rashes, lesions, ulcers on limited skin evaluation. No induration; Warm and dry.  Neurologic: CN 2-12 grossly intact with no focal deficits.  Romberg sign and cerebellar reflexes not assessed.  Psychiatric: Mildly impaired judgment and insight.  Somnolent and drowsy but easily arousable..  Normal mood and appropriate affect.   Data Reviewed: I have personally reviewed following labs and imaging studies  CBC: Recent Labs  Lab 05/01/20 1036 05/02/20 0649 05/03/20 0927  WBC 12.0* 7.3 7.0  NEUTROABS 9.7*  --  5.3  HGB 10.5* 9.4* 9.8*  HCT 30.9* 29.0* 30.3*  MCV 82.6 86.3 85.6  PLT 173 146* 179   Basic Metabolic Panel: Recent Labs  Lab 05/01/20 1036 05/02/20 0649 05/03/20 0927  NA 135 139 134*  K 3.4* 4.2 3.7  CL 101 108 102  CO2 GLUCOSE 259* 120* 147*  BUN 33* 32* 14  CREATININE 1.11* 0.94 0.65  CALCIUM 8.6* 8.6* 8.5*  MG  --  1.7 1.6*  PHOS  --   --  2.5  GFR: CrCl cannot be calculated (Unknown ideal weight.). Liver Function Tests: Recent Labs  Lab 05/01/20 1036 05/02/20 0649 05/03/20 0927  AST 20 13* 14*  ALT 13 11 12   ALKPHOS 53 46 53  BILITOT 1.3* 0.9 0.7  PROT 6.8 6.0* 6.3*  ALBUMIN 3.1* 2.7* 2.7*   No results for input(s): LIPASE, AMYLASE in the last 168 hours. No results for input(s): AMMONIA in the last 168 hours. Coagulation Profile: No results for input(s): INR, PROTIME in the last 168 hours. Cardiac Enzymes: No results for input(s): CKTOTAL, CKMB, CKMBINDEX, TROPONINI in the last 168 hours. BNP (last 3 results) No results for input(s): PROBNP in the last 8760 hours. HbA1C: Recent Labs    05/01/20 1038  HGBA1C 6.1*   CBG: Recent Labs  Lab 05/02/20 1248 05/02/20 1631 05/02/20 2030 05/03/20 0754 05/03/20 1136  GLUCAP 149* 133* 130* 106* 158*   Lipid Profile: No results for input(s): CHOL, HDL, LDLCALC, TRIG, CHOLHDL, LDLDIRECT in the last 72 hours. Thyroid Function Tests: No results for input(s): TSH, T4TOTAL, FREET4, T3FREE, THYROIDAB in the last 72 hours. Anemia Panel: No results for input(s): VITAMINB12, FOLATE, FERRITIN, TIBC, IRON, RETICCTPCT in the last 72 hours. Sepsis Labs: Recent Labs  Lab 05/01/20 1036 05/01/20 1316 05/01/20 1848 05/01/20 2227 05/02/20 1852 05/02/20 2215  PROCALCITON 0.15   --   --   --   --   --   LATICACIDVEN 2.6*   < > 1.7 2.3* 1.2 1.0   < > = values in this interval not displayed.    Recent Results (from the past 240 hour(s))  Urine Culture     Status: Abnormal (Preliminary result)   Collection Time: 05/01/20 10:38 AM   Specimen: Urine, Random  Result Value Ref Range Status   Specimen Description   Final    URINE, RANDOM Performed at [facility], [address] [place], [place] [address]    Special Requests   Final    NONE Performed at [facility], [address] [place], [place] [address]    Culture >=100,000 COLONIES/mL GRAM NEGATIVE RODS (A)  Final   Report Status PENDING  Incomplete  SARS Coronavirus 2 by RT PCR (hospital order, performed in [facility] hospital lab) Nasopharyngeal Nasopharyngeal Swab     Status: None   Collection Time: 05/01/20 10:39 AM   Specimen: Nasopharyngeal Swab  Result Value Ref Range Status   SARS Coronavirus 2 NEGATIVE NEGATIVE Final    Comment: (NOTE) SARS-CoV-2 target nucleic acids are NOT DETECTED.  The SARS-CoV-2 RNA is generally detectable in upper and lower respiratory specimens during the acute phase of infection. The lowest concentration of SARS-CoV-2 viral copies this assay can detect is 250 copies / mL. A negative result does not preclude SARS-CoV-2 infection and should not be used as the sole basis for treatment or other patient management decisions.  A negative result may occur with improper specimen collection / handling, submission of specimen other than nasopharyngeal swab, presence of viral mutation(s) within the areas targeted by this assay, and inadequate number of viral copies (<250 copies / mL). A negative result must be combined with clinical observations, patient history, and epidemiological information.  Fact Sheet for Patients:   BoilerBrush.com.cyhttps://www.fda.gov/media/136312/download  Fact Sheet for Healthcare Providers: https://pope.com/https://www.fda.gov/media/136313/download  This  test is not yet approved or  cleared by the [place] FDA and has been authorized for detection and/or diagnosis of SARS-CoV-2 by FDA under an Emergency Use Authorization (EUA).  This EUA will remain in effect (  meaning this test can be used) for the duration of the COVID-19 declaration under Section 564(b)(1) of the Act, 21 U.S.C. section 360bbb-3(b)(1), unless the authorization is terminated or revoked sooner.  Performed at [facility], [address] [place], [place] [address]   CULTURE, BLOOD (ROUTINE X 2) w Reflex to ID Panel     Status: None (Preliminary result)   Collection Time: 05/01/20  6:23 PM   Specimen: BLOOD  Result Value Ref Range Status   Specimen Description BLOOD BLOOD LEFT WRIST  Final   Special Requests   Final    BOTTLES DRAWN AEROBIC AND ANAEROBIC Blood Culture results may not be optimal due to an inadequate volume of blood received in culture bottles   Culture   Final    NO GROWTH 2 DAYS Performed at [facility], [address], [place], [place] [address]    Report Status PENDING  Incomplete  CULTURE, BLOOD (ROUTINE X 2) w Reflex to ID Panel     Status: None (Preliminary result)   Collection Time: 05/01/20  6:28 PM   Specimen: BLOOD  Result Value Ref Range Status   Specimen Description BLOOD LEFT ANTECUBITAL  Final   Special Requests   Final    BOTTLES DRAWN AEROBIC AND ANAEROBIC Blood Culture results may not be optimal due to an inadequate volume of blood received in culture bottles   Culture   Final    NO GROWTH 2 DAYS Performed at [facility], [address] [place], [place] [address]    Report Status PENDING  Incomplete    RN Pressure Injury Documentation:     Estimated body mass index is 21.63 kg/m as calculated from the following:   Height as of 01/19/19:  (1.676 m).   Weight as of 01/19/19: 60.8 kg.  Malnutrition Type:      Malnutrition Characteristics:      Nutrition Interventions:      Radiology Studies: MR BRAIN WO CONTRAST  Result Date: 05/01/2020 CLINICAL DATA:  86 year old female became unresponsive. Altered mental status change. EXAM: MRI HEAD WITHOUT CONTRAST TECHNIQUE: Multiplanar, multiecho pulse sequences of the brain and surrounding structures were obtained without intravenous contrast. COMPARISON:  Head CT earlier today. FINDINGS: Brain: No restricted diffusion or evidence of acute infarction. Multiple chronic lacunar infarcts in the bilateral deep gray nuclei, the largest in the medial left thalamus. Widespread and confluent bilateral cerebral white matter T2 and FLAIR hyperintensity. Moderate similar signal heterogeneity in the pons. No cortical encephalomalacia. Possible chronic microhemorrhage in the right deep cerebellar nuclei. No midline shift, mass effect, evidence of mass lesion, ventriculomegaly, extra-axial collection or acute intracranial hemorrhage. Cervicomedullary junction and pituitary are within normal limits. Vascular: Major intracranial vascular flow voids are preserved. Skull and upper cervical spine: Negative for age visible cervical spine, bone marrow signal. Sinuses/Orbits: Postoperative changes to both globes, otherwise negative orbits. Paranasal Visualized paranasal sinuses and mastoids are stable and well pneumatized. Other: Grossly normal visible internal auditory structures. Scalp and face soft tissues appear negative. IMPRESSION: 1. No acute intracranial abnormality. 2. 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 Electronically Signed   By: Odessa Fleming M.D.   On: 05/01/2020 15:24   Scheduled Meds: . amLODipine  10 mg Oral Daily  . aspirin  325 mg Oral Q breakfast  . atorvastatin  40 mg Oral Daily  . carvedilol  12.5 mg Oral BID WC  . cholecalciferol  1,000 Units Oral Daily  . enoxaparin (LOVENOX) injection  40 mg Subcutaneous Q24H  . insulin aspart  0-5  Units Subcutaneous QHS  . insulin aspart  0-9 Units Subcutaneous TID WC   Continuous Infusions: . sodium chloride 75 mL/hr at 05/03/20 0536  . cefTRIAXone (ROCEPHIN)  IV 1 g (05/03/20 0537)  . magnesium sulfate bolus IVPB      LOS: 1 day   Merlene Laughter, DO [facility] PAGER is on AMION  If 7PM-7AM, please contact night-coverage [email or web address]

## 2020-05-03 NOTE — Evaluation (Signed)
Occupational Therapy Evaluation Patient Details Name: Kim Roy MRN: [id number] DOB: Dec 31, 1930 Today's Date: 05/03/2020    History of Present Illness  Kim Roy is a 86 y.o. female with medical history significant of hypertension, hyperlipidemia, diabetes mellitus, CKD-3, who presents with altered mental status and syncope. Pt from [facility]. Per daughter-in-law patient is WC bond at baseline.    Clinical Impression   [name] was seen for OT evaluation this date. Prior to hospital admission, pt was resident at [facility] using [facility] at baseline, completes stand pivot transfers and takes small "couple steps" occasionally per DiL. Pt A&O to self and location only. Pt presents to acute OT demonstrating impaired ADL performance, functional cognition, and functional mobility 2/2 decreased safety awareness, functional strength/ROM/balance deficits, and decreased activity tolerance. Pt currently requires MAX A to scoot higher in bed. SETUP self-drinking at bed level. MOD A for LB access - VCs for sequencing. MIN A + VCs for washing oatmeal off of chest at bed level. Mobility deferred 2/2 pt requesting to finish meal and likely +2 required. Pt would benefit from skilled OT to address noted impairments and functional limitations (see below for any additional details) in order to maximize safety and independence while minimizing falls risk and caregiver burden. Upon hospital discharge, recommend STR to maximize pt safety and return to PLOF.     Follow Up Recommendations  SNF    Equipment Recommendations  Other (comment) (TBD)    Recommendations for Other Services       Precautions / Restrictions Precautions Precautions: Fall Restrictions Weight Bearing Restrictions: No      Mobility Bed Mobility Overal bed mobility: Needs Assistance      General bed mobility comments: MAX A + trended bed to scoot higher. MOD A to adjust torso in bed   Transfers    General  transfer comment: deferred, pt requires +2 for safe t/f attempt         ADL either performed or assessed with clinical judgement   ADL Overall ADL's : Needs assistance/impaired        General ADL Comments: SETUP self-drinking at bed level. MOD A for LB access - VCs for sequencing. MIN A + VCs for washing oatmeal off of chest at bed level                   Pertinent Vitals/Pain Pain Assessment: No/denies pain        Extremity/Trunk Assessment Upper Extremity Assessment Upper Extremity Assessment: Generalized weakness (BUE AROM shoulder flexion ~90*)   Lower Extremity Assessment Lower Extremity Assessment: Generalized weakness       Communication Communication Communication: No difficulties   Cognition Arousal/Alertness: Awake/alert Behavior During Therapy: Flat affect Overall Cognitive Status: Impaired/Different from baseline Area of Impairment: Orientation;Problem solving;Following commands        Orientation Level: Disoriented to;Time;Situation     Following Commands: Follows one step commands with increased time     Problem Solving: Slow processing;Decreased initiation;Requires verbal cues;Requires tactile cues     General Comments       Exercises Exercises: Other exercises Other Exercises Other Exercises: Pt educated re: OT role, importance of moblity for strengthening, ECS Other Exercises: LBD, self-drinking, bed mobility, UB bathing   Shoulder Instructions      Home Living Family/patient expects to be discharged to:: Skilled nursing facility [facility])     Type of Home: Assisted living Home Access: Ramped entrance     Home Layout: One level  Bathroom Shower/Tub: Producer, television/film/video: Handicapped height Bathroom Accessibility: Yes   Home Equipment: Environmental consultant - 2 wheels;Bedside commode;Grab bars - tub/shower;Shower seat;Wheelchair - Sport and exercise psychologist Comments: Per DIL patient is mostly WC at  baseline, completes stand pivot transfers and takes small "couple steps" occassionally      Prior Functioning/Environment Level of Independence: Needs assistance  Gait / Transfers Assistance Needed: DIL reports patient is modA for car transfers, minA for supine > sit but able to sit EOB ind, and supervision for stand pivot transfers to [employer] chair              OT Problem List: Decreased strength;Decreased activity tolerance;Impaired balance (sitting and/or standing);Decreased range of motion;Decreased knowledge of use of DME or AE;Decreased safety awareness      OT Treatment/Interventions: Self-care/ADL training;Therapeutic exercise;Energy conservation;DME and/or AE instruction;Therapeutic activities;Patient/family education;Balance training    OT Goals(Current goals can be found in the care plan section) Acute Rehab OT Goals Patient Stated Goal: go home OT Goal Formulation: With patient Time For Goal Achievement: 05/17/20 Potential to Achieve Goals: Fair ADL Goals Pt Will Perform Grooming: with min assist;sitting Pt Will Perform Upper Body Bathing: with set-up;bed level Pt Will Transfer to Toilet: with min guard assist (rolling at bed level)  OT Frequency: Min 1X/week   Barriers to D/C:               AM-PAC OT "6 Clicks" Daily Activity     Outcome Measure Help from another person eating meals?: None Help from another person taking care of personal grooming?: A Little Help from another person toileting, which includes using toliet, bedpan, or urinal?: A Lot Help from another person bathing (including washing, rinsing, drying)?: A Lot Help from another person to put on and taking off regular upper body clothing?: A Little Help from another person to put on and taking off regular lower body clothing?: A Lot 6 Click Score: 16   End of Session    Activity Tolerance: Patient limited by fatigue Patient left: in bed;with call bell/phone within reach;with bed alarm  set  OT Visit Diagnosis: Other abnormalities of gait and mobility (R26.89);Muscle weakness (generalized) (M62.81)                Time: 1610-9604 OT Time Calculation (min): 13 min Charges:  OT General Charges $OT Visit: 1 Visit OT Evaluation $OT Eval Low Complexity: 1 Low OT Treatments $Self Care/Home Management : 8-22 mins  Kathie Dike, M.S. OTR/L  05/03/20, 10:11 AM  ascom [phone]

## 2020-05-04 ENCOUNTER — Inpatient Hospital Stay: Payer: Medicare (Managed Care)

## 2020-05-04 DIAGNOSIS — B9620 Unspecified Escherichia coli [E. coli] as the cause of diseases classified elsewhere: Secondary | ICD-10-CM

## 2020-05-04 DIAGNOSIS — N390 Urinary tract infection, site not specified: Secondary | ICD-10-CM

## 2020-05-04 DIAGNOSIS — J40 Bronchitis, not specified as acute or chronic: Secondary | ICD-10-CM

## 2020-05-04 LAB — COMPREHENSIVE METABOLIC PANEL
ALT: 12 U/L (ref 0–44)
AST: 13 U/L — ABNORMAL LOW (ref 15–41)
Albumin: 2.5 g/dL — ABNORMAL LOW (ref 3.5–5.0)
Alkaline Phosphatase: 48 U/L (ref 38–126)
Anion gap: 9 (ref 5–15)
BUN: 12 mg/dL (ref 8–23)
CO2: 22 mmol/L (ref 22–32)
Calcium: 8.2 mg/dL — ABNORMAL LOW (ref 8.9–10.3)
Chloride: 103 mmol/L (ref 98–111)
Creatinine, Ser: 0.5 mg/dL (ref 0.44–1.00)
GFR calc Af Amer: >60 mL/min (ref >60)
GFR calc non Af Amer: >60 mL/min (ref >60)
Glucose, Bld: 119 mg/dL — ABNORMAL HIGH (ref 70–99)
Potassium: 3.6 mmol/L (ref 3.5–5.1)
Sodium: 134 mmol/L — ABNORMAL LOW (ref 135–145)
Total Bilirubin: 0.6 mg/dL (ref 0.3–1.2)
Total Protein: 5.8 g/dL — ABNORMAL LOW (ref 6.5–8.1)

## 2020-05-04 LAB — URINE CULTURE: Culture: >=100000 — AB

## 2020-05-04 LAB — CBC WITH DIFFERENTIAL/PLATELET
Abs Immature Granulocytes: 0.02 10*3/uL (ref 0.00–0.07)
Basophils Absolute: 0 10*3/uL (ref 0.0–0.1)
Basophils Relative: 1 %
Eosinophils Absolute: 0.1 10*3/uL (ref 0.0–0.5)
Eosinophils Relative: 1 %
HCT: 26.8 % — ABNORMAL LOW (ref 36.0–46.0)
Hemoglobin: 9.2 g/dL — ABNORMAL LOW (ref 12.0–15.0)
Immature Granulocytes: 0 %
Lymphocytes Relative: 16 %
Lymphs Abs: 1 10*3/uL (ref 0.7–4.0)
MCH: 28.5 pg (ref 26.0–34.0)
MCHC: 34.3 g/dL (ref 30.0–36.0)
MCV: 83 fL (ref 80.0–100.0)
Monocytes Absolute: 0.6 10*3/uL (ref 0.1–1.0)
Monocytes Relative: 10 %
Neutro Abs: 4.5 10*3/uL (ref 1.7–7.7)
Neutrophils Relative %: 72 %
Platelets: 184 10*3/uL (ref 150–400)
RBC: 3.23 MIL/uL — ABNORMAL LOW (ref 3.87–5.11)
RDW: 14.7 % (ref 11.5–15.5)
WBC: 6.3 10*3/uL (ref 4.0–10.5)
nRBC: 0 % (ref 0.0–0.2)

## 2020-05-04 LAB — FERRITIN: Ferritin: 250 ng/mL (ref 11–307)

## 2020-05-04 LAB — IRON AND TIBC
Iron: 19 ug/dL — ABNORMAL LOW (ref 28–170)
Saturation Ratios: 12 % (ref 10.4–31.8)
TIBC: 158 ug/dL — ABNORMAL LOW (ref 250–450)
UIBC: 139 ug/dL

## 2020-05-04 LAB — RETICULOCYTES
Immature Retic Fract: 9.7 % (ref 2.3–15.9)
RBC.: 3.17 MIL/uL — ABNORMAL LOW (ref 3.87–5.11)
Retic Count, Absolute: 32 10*3/uL (ref 19.0–186.0)
Retic Ct Pct: 1 % (ref 0.4–3.1)

## 2020-05-04 LAB — GLUCOSE, CAPILLARY
Glucose-Capillary: 118 mg/dL — ABNORMAL HIGH (ref 70–99)
Glucose-Capillary: 137 mg/dL — ABNORMAL HIGH (ref 70–99)
Glucose-Capillary: 170 mg/dL — ABNORMAL HIGH (ref 70–99)
Glucose-Capillary: 151 mg/dL — ABNORMAL HIGH (ref 70–99)

## 2020-05-04 LAB — PHOSPHORUS: Phosphorus: 2.6 mg/dL (ref 2.5–4.6)

## 2020-05-04 LAB — MAGNESIUM: Magnesium: 1.9 mg/dL (ref 1.7–2.4)

## 2020-05-04 LAB — SARS CORONAVIRUS 2 BY RT PCR (HOSPITAL ORDER, PERFORMED IN ~~LOC~~ HOSPITAL LAB): SARS Coronavirus 2: NEGATIVE

## 2020-05-04 LAB — VITAMIN B12: Vitamin B-12: 217 pg/mL (ref 180–914)

## 2020-05-04 LAB — FOLATE: Folate: 19.2 ng/mL (ref >5.9)

## 2020-05-04 MED ORDER — AZITHROMYCIN 500 MG PO TABS
500.0000 mg | ORAL_TABLET | Freq: Every day | ORAL | Status: AC
  Administered 2020-05-04: 500 mg via ORAL
  Filled 2020-05-04 (×2): qty 1

## 2020-05-04 MED ORDER — LEVALBUTEROL HCL 0.63 MG/3ML IN NEBU
0.6300 mg | INHALATION_SOLUTION | Freq: Four times a day (QID) | RESPIRATORY_TRACT | Status: DC
  Filled 2020-05-04 (×4): qty 3

## 2020-05-04 MED ORDER — ENSURE ENLIVE PO LIQD: 237.0000 mL | ORAL | 12 refills | Status: AC

## 2020-05-04 MED ORDER — GUAIFENESIN ER 600 MG PO TB12
1200.0000 mg | ORAL_TABLET | Freq: Two times a day (BID) | ORAL | Status: DC
  Administered 2020-05-04: 1200 mg via ORAL
  Filled 2020-05-04: qty 2

## 2020-05-04 MED ORDER — POLYSACCHARIDE IRON COMPLEX 150 MG PO CAPS
150.0000 mg | ORAL_CAPSULE | Freq: Every day | ORAL | Status: DC
  Administered 2020-05-04: 150 mg via ORAL
  Filled 2020-05-04: qty 1

## 2020-05-04 MED ORDER — CEPHALEXIN 500 MG PO CAPS: 500.0000 mg | ORAL_CAPSULE | Freq: Three times a day (TID) | ORAL | 0 refills | Status: AC

## 2020-05-04 MED ORDER — AZITHROMYCIN 250 MG PO TABS: 250.0000 mg | ORAL_TABLET | Freq: Every day | ORAL | Status: DC

## 2020-05-04 MED ORDER — ONDANSETRON HCL 4 MG PO TABS: 4.0000 mg | ORAL_TABLET | Freq: Four times a day (QID) | ORAL | 0 refills | Status: AC | PRN

## 2020-05-04 MED ORDER — AZITHROMYCIN 250 MG PO TABS: 250.0000 mg | ORAL_TABLET | Freq: Every day | ORAL | 0 refills | Status: AC

## 2020-05-04 MED ORDER — ENSURE ENLIVE PO LIQD: 237.0000 mL | ORAL | Status: DC

## 2020-05-04 MED ORDER — IPRATROPIUM BROMIDE 0.02 % IN SOLN
0.5000 mg | Freq: Four times a day (QID) | RESPIRATORY_TRACT | Status: DC
  Administered 2020-05-04: 0.5 mg via RESPIRATORY_TRACT
  Filled 2020-05-04: qty 2.5

## 2020-05-04 MED ORDER — LISINOPRIL-HYDROCHLOROTHIAZIDE 10-12.5 MG PO TABS: 1.0000 | ORAL_TABLET | Freq: Every day | ORAL | 0 refills | Status: DC

## 2020-05-04 MED ORDER — CEPHALEXIN 500 MG PO CAPS: 500.0000 mg | ORAL_CAPSULE | Freq: Three times a day (TID) | ORAL | Status: DC

## 2020-05-04 MED ORDER — POLYSACCHARIDE IRON COMPLEX 150 MG PO CAPS: 150.0000 mg | ORAL_CAPSULE | Freq: Every day | ORAL | 0 refills | Status: AC

## 2020-05-04 MED ORDER — GUAIFENESIN ER 600 MG PO TB12: 1200.0000 mg | ORAL_TABLET | Freq: Two times a day (BID) | ORAL | 0 refills | Status: AC

## 2020-05-04 NOTE — Discharge Summary (Signed)
Physician Discharge Summary  Carollee Nussbaumer ZOX:[id number] DOB: [birth year] DOA: 05/01/2020  PCP: [facility]  Admit date: 05/01/2020 Discharge date: 05/04/2020  Admitted From: SNF Disposition: SNF  Recommendations for Outpatient Follow-up:  1. Follow up with PCP in 1-2 weeks 2. Please obtain CMP/CBC, Mag, Phos in one week 3. Repeat CXR in 3-6 Weeks  4. Please follow up on the following pending results:  Home Health: No Equipment/Devices: Rolling walker with 5" wheels  Discharge Condition: Stable  CODE STATUS: FULL CODE  Diet recommendation: Heart Healthy Carb Modified Diet   Brief/Interim Summary: HPI per Dr. Lorretta Harp on 05/02/20 HPI: Kim Balzarini Johnsonis a 86 y.o.femalewith medical history significant ofhypertension, hyperlipidemia, diabetes mellitus, CKD-3,who presents with altered mental status and syncope.  Per report, patient was noted to be confusedin facility. Normally patient is orientated x3 per her daughter. She hadone episode of syncope and passed out in the facility.She has generalized weakness. No facial droop or slurred speech. Patient does not seem to have chest pain, shortness breath. She has mild dry cough. No fever or chills. Patient has mild lower abdominal pain, but no tenderness on examination. No active nausea,vomiting, diarrhea noted. Not sure if patient has symptoms of UTI.  ED Course:pt was found to have WBC 12.0, lactic acid 2.6, 2.8,urinalysis with many bacteria (no leukocyte), negative Covid PCR, slightly worsening renal function, temperature 99, blood pressure 127/64, heart rate 79, RR17, oxygen saturation 94-99% on room air. MRI is negative for stroke. Patient is placed on MedSurg bed for position  **Interim History Patient was withdrawn and wanting to sleep the day before yesterday morning and was a little sleepy again yesterday but is improved today.  Labs are improved and she has been adequately  hydrated we will continue IV fluid hydration. Urine culture done and Results show >100,000 CFU of E Coli that was relatively pan-senstive. PT/OT recommending SNF and Rolling Walker with 5" Wheels.   Morning she is coughing up some sputum and repeat chest x-ray shows "New focus of atelectasis left lower lobe. Underlying interstitial thickening, likely representing a degree of underlying chronic bronchitis. No frank consolidation or edema. Stable cardiac prominence." We will start her on azithromycin, and breathing treatments, Guaifenesin and a flutter valve and incentive spirometer prior to D/C. Will continue p.o. antibiotics and change the IV ceftriaxone to p.o. Cefdinir and continue azithromycin at discharge.  Respiratory status is stable she is afebrile and not requiring supplemental oxygen nasal cannula.  Discharge Diagnoses:  Principal Problem:   Syncope Active Problems:   Type II diabetes mellitus with renal manifestations (HCC)   HTN (hypertension)   UTI (urinary tract infection)   Acute metabolic encephalopathy   HLD (hyperlipidemia)   Hypokalemia   CKD (chronic kidney disease), stage IIIa  Syncope -Etiology is not clear likely in the setting of dehydration and suspected UTI.  -Head CT on admission showed "Multiple small hypodensities involving bilateral basal ganglia and thalami, compatible with age indeterminate lacunar infarcts. If there is concern for acute infarct, MRI could further evaluate. Advanced presumed chronic microvascular ischemic disease" -MRI is negative for stroke and official Read was  "No acute intracranial abnormality. Chronic small vessel disease with multiple chronic lacunar infarcts of the deep gray nuclei corresponding to the CT finding earlier today." -Placed on MedSurg as observation but will change to inpatient -Frequent neuro checks -Initial cardiac troponins were flat and negative with the initial one being 16 and the repeat being 14 -Treat UTI as  below -  Initial IV fluid given was2 L normal saline, followed by 75 mL/hr and will stop later today; I gave an additional 250 mL bolus the day before yesterday. IVF now to Stop -She will need PT and OT to evaluate and treat and they are recommending SNF  -Ordered orthostatic vital signs and she was not Orthostatic as her BP readings were:lying 115/52 73, sitting 109/55 73, standing 100/50 72 -We will repeat the Covid test given that she is likely can be discharged back to her facility today  Acute Metabolic Encephalopathy, improved  -Head CT and MRI as above -Likely due to possible UTI; -Frequent neuro checks -Start Delirium precautions -Mentation is much improved   Bronchitis -Likely chronic she is having some productive colored sputum -Chest x-ray showed "New focus of atelectasis left lower lobe. Underlying interstitial thickening, likely representing a degree of underlying chronic bronchitis. No frank consolidation or edema. Stable cardiac prominence. Aortic Atherosclerosis." -She is afebrile with no white count currently not in any respiratory distress -Has minimal wheezing -Added flutter valve, incentive spirometry as well as guaifenesin 1200 p.o. twice daily and will start today and continue for 5 more days -Add azithromycin and given 500 mg x 1 and then 250 for the next 4 days -Repeat chest x-ray in 3 to 6 weeks  -Do not feel that she needs steroids clinically and can have a reassessment in the outpatient setting by PCP  E Coli UTI (Urinary Tract Infection), poA -Urinanalysis on admission showed cloudy appearance with amber color urine, negative nitrite, negative ketones, negative leukocytes, many bacteria, 0-5 RBCs per high-power field, 0-5 squamous epithelial cells, 0-5 WBCs -Urine culture still showing >100,000 CFU of GNR and it turned out to be E Coli that was relatively pan-sensitive  -Patient had a leukocytosis on admission which could be from dehydration or also could be  from infection and WBC went from 12.0 is now 7.0 today  -Empirically started on IV antibiotics with ceftriaxone and initially was given IV cefepime in the ED; Change IV Ceftriaxone to po  -Continued empiric antibiotic coverage for now and de-escalated to Keflex 500 mg po q8h -Lactic acid level is now trended down to 1.0 after all the fluid hydration -Lactic acid level was elevated on admission and she is given 2 L normal saline in the ED followed by normal saline 75 MLS per hour and will continue for now for 12 more hours -Blood cultures x2 are showing NGTD at 3 Days -Continue with supportive care antiemetics with p.o./IV Zofran every 6 as needed for nausea  Type II Diabetes Mellitus with Renal Manifestations (HCC): -Patient is taking Metformin and glipizide at home and will currently hold these; upon further review she is no longer taking glipizide and will discontinue Metformin 1000 g p.o. twice daily -Continue with sensitive NovoLog sliding scale insulin before meals and at bedtime -Checked HbA1c and was 6.1 -Continue monitor CBGs; CBG's ranging from  118-1 70 and blood sugar this morning on CMP was 119 -Adjust insulin regimen as necessary  HTN (Hypertension) -Continue Amlodipine 10 mg po Daily and Carvedilol 12.5 mg po BID -Currently holding Zestoretic due to slightly worsening renal function -Continue To to monitor blood pressures per protocol -Last blood pressure is 143/70  HLD (Hyperlipidemia) -C/w Atorvastatin 40 mg po qHS  AKI on CKD (chronic kidney disease), stage IIIa, improved -Slightly worsening than baseline.  -Baseline creatinine 0.82 on 01/22/2019. Her creatinine is 1.11, BUN 33 on admission an is now improving -BUN/Cr is now improved and is  now 12/0.50 -We will reduce her Zestoretic to half the dose at 10-12.5 mg p.o. daily -Avoid nephrotoxic medications, contrast dyes, hypotension and renally dose medications -Continue monitor and trend and repeat CMP in  a.m.  Hyperbilirubinemia -Mild and improved  -T bili went from 1.3 on admission is now 0.6 -Continue monitor and trend and repeat CMP in a.m. -Continue with IV fluid hydration as above  Hypertension -Resume Zestoretic but at half the dose -Last blood pressure was 127/50 -Continue with blood pressures per protocol  Hypokalemia -Improved. K+ went from 3.4 -> 4.2 -> 3.7 and today was 3.6 -Magnesium level was 1.6 yesterday and today is 1.9 -Continue to Monitor and Replete as Necessary -Repeat CMP at SNF  Hypomagnesemia -Patient's Mag Level was 1.9 -Replete with IV Mag Sulfate 2 grams yesterday -Continue to Replete as Necessary -Repeat Mag Level at SNF  Hyponatremia -Mild with a Sodium of 134 again -We will continue to monitor and trend and continue with IV fluid hydration with normal saline at a rate of 75 MLS per hour and this is now stopped -Continue to monitor and trend repeat CMP in a.m.  Normocytic Anemia -Patient's globin/hematocrit went from 10.5/30.9 is now 9.4/29.0 -> 9.8/30.3 and today was 9.2/26.8 -Checked anemia panel and showed an iron level of 19, U IBC 139, TIBC 150, saturation ratios of 12%, ferritin level 250, folate level of 19.2, vitamin B12 217 -We will start Niferex 150 mg p.o. daily -Continue to monitor for signs and symptoms of bleeding; currently no overt bleeding noted -Repeat CBC in a.m.  Thrombocytopenia -Patient's platelet count went from 173 is now 146  the day before yesterday and yesterday was 179 and today it is 184 -Continue monitor and trend and currently no signs and symptoms of bleeding -Repeat CBC at SNF  Discharge Instructions  Discharge Instructions    Call MD for:  difficulty breathing, headache or visual disturbances   Complete by: As directed    Call MD for:  extreme fatigue   Complete by: As directed    Call MD for:  hives   Complete by: As directed    Call MD for:  persistant dizziness or light-headedness   Complete by:  As directed    Call MD for:  persistant nausea and vomiting   Complete by: As directed    Call MD for:  redness, tenderness, or signs of infection (pain, swelling, redness, odor or green/yellow discharge around incision site)   Complete by: As directed    Call MD for:  severe uncontrolled pain   Complete by: As directed    Call MD for:  temperature >100.4   Complete by: As directed    Diet - low sodium heart healthy   Complete by: As directed    Diet Carb Modified   Complete by: As directed    Discharge instructions   Complete by: As directed    You were cared for by a hospitalist during your hospital stay. If you have any questions about your discharge medications or the care you received while you were in the hospital after you are discharged, you can call the unit and ask to speak with the hospitalist on call if the hospitalist that took care of you is not available. Once you are discharged, your primary care physician will handle any further medical issues. Please note that NO REFILLS for any discharge medications will be authorized once you are discharged, as it is imperative that you return to your primary care physician (  or establish a relationship with a primary care physician if you do not have one) for your aftercare needs so that they can reassess your need for medications and monitor your lab values.  Follow up with PCP and repeat CXR in 3-6 weeks. Take all medications as prescribed. If symptoms change or worsen please return to the ED for evaluation   Increase activity slowly   Complete by: As directed      Allergies as of 05/04/2020   No Known Allergies     Medication List    STOP taking these medications   glipiZIDE 10 MG tablet Commonly known as: GLUCOTROL   HYDROcodone-acetaminophen 5-325 MG tablet Commonly known as: NORCO/VICODIN   lisinopril-hydrochlorothiazide 20-25 MG tablet Commonly known as: ZESTORETIC Replaced by: lisinopril-hydrochlorothiazide 10-12.5 MG  tablet     TAKE these medications   acetaminophen 325 MG tablet Commonly known as: TYLENOL Take 2 tablets (650 mg total) by mouth every 6 (six) hours as needed for mild pain (or Fever >/= 101).   amLODipine 10 MG tablet Commonly known as: NORVASC Take 10 mg by mouth daily.   aspirin 325 MG EC tablet Take 1 tablet (325 mg total) by mouth daily with breakfast.   atorvastatin 40 MG tablet Commonly known as: LIPITOR Take 40 mg by mouth daily.   azithromycin 250 MG tablet Commonly known as: ZITHROMAX Take 1 tablet (250 mg total) by mouth daily for 4 days.   carvedilol 12.5 MG tablet Commonly known as: COREG Take 12.5 mg by mouth 2 (two) times a day.   cephALEXin 500 MG capsule Commonly known as: KEFLEX Take 1 capsule (500 mg total) by mouth every 8 (eight) hours for 4 days. Start taking on: May 05, 2020   cetirizine 10 MG tablet Commonly known as: ZYRTEC Take 10 mg by mouth daily.   Cholecalciferol 25 MCG (1000 UT) tablet Take 1,000 Units by mouth daily.   docusate sodium 100 MG capsule Commonly known as: COLACE Take 1 capsule (100 mg total) by mouth 2 (two) times daily.   feeding supplement (ENSURE ENLIVE) Liqd Take 237 mLs by mouth daily.   guaiFENesin 600 MG 12 hr tablet Commonly known as: MUCINEX Take 2 tablets (1,200 mg total) by mouth 2 (two) times daily for 5 days.   iron polysaccharides 150 MG capsule Commonly known as: NIFEREX Take 1 capsule (150 mg total) by mouth daily.   lisinopril-hydrochlorothiazide 10-12.5 MG tablet Commonly known as: Zestoretic Take 1 tablet by mouth daily. Replaces: lisinopril-hydrochlorothiazide 20-25 MG tablet   metFORMIN 1000 MG tablet Commonly known as: GLUCOPHAGE Take 1,000 mg by mouth daily.   ondansetron 4 MG tablet Commonly known as: ZOFRAN Take 1 tablet (4 mg total) by mouth every 6 (six) hours as needed for nausea.       No Known Allergies  Consultations:  None  Procedures/Studies: DG Chest 1  View  Result Date: 05/04/2020 CLINICAL DATA:  Shortness of breath and cough EXAM: CHEST  1 VIEW COMPARISON:  May 01, 2020. FINDINGS: Interstitial thickening remains bilaterally. There is a new area of apparent atelectasis in the left lower lung region. Heart is borderline enlarged with pulmonary vascularity normal. There is aortic atherosclerosis. No adenopathy. No bone lesions. IMPRESSION: New focus of atelectasis left lower lobe. Underlying interstitial thickening, likely representing a degree of underlying chronic bronchitis. No frank consolidation or edema. Stable cardiac prominence. Aortic Atherosclerosis (ICD10-I70.0). Electronically Signed   By: Bretta Bang III M.D.   On: 05/04/2020 10:36   CT Head  Wo Contrast  Result Date: 05/01/2020 CLINICAL DATA:  Mental status change. EXAM: CT HEAD WITHOUT CONTRAST TECHNIQUE: Contiguous axial images were obtained from the base of the skull through the vertex without intravenous contrast. COMPARISON:  None. FINDINGS: Brain: Advanced white matter hypoattenuation, likely the sequela of chronic microvascular ischemic disease. There are multiple small hypodensities involving bilateral basal ganglia and thalami, compatible with age indeterminate lacunar infarcts. No evidence of acute large vascular territory infarction. No acute hemorrhage. Moderate diffuse cerebral atrophy with ex vacuo ventricular dilation. No hydrocephalus. Vascular: Calcific intracranial atherosclerosis. Skull: Normal. Negative for fracture or focal lesion. Sinuses/Orbits: No acute finding. IMPRESSION: 1. Multiple small hypodensities involving bilateral basal ganglia and thalami, compatible with age indeterminate lacunar infarcts. If there is concern for acute infarct, MRI could further evaluate. 2. Advanced presumed chronic microvascular ischemic disease. Electronically Signed   By: Feliberto Harts MD   On: 05/01/2020 11:20   MR BRAIN WO CONTRAST  Result Date: 05/01/2020 CLINICAL  DATA:  86 year old female became unresponsive. Altered mental status change. EXAM: MRI HEAD WITHOUT CONTRAST TECHNIQUE: Multiplanar, multiecho pulse sequences of the brain and surrounding structures were obtained without intravenous contrast. COMPARISON:  Head CT earlier today. FINDINGS: Brain: No restricted diffusion or evidence of acute infarction. Multiple chronic lacunar infarcts in the bilateral deep gray nuclei, the largest in the medial left thalamus. Widespread and confluent bilateral cerebral white matter T2 and FLAIR hyperintensity. Moderate similar signal heterogeneity in the pons. No cortical encephalomalacia. Possible chronic microhemorrhage in the right deep cerebellar nuclei. No midline shift, mass effect, evidence of mass lesion, ventriculomegaly, extra-axial collection or acute intracranial hemorrhage. Cervicomedullary junction and pituitary are within normal limits. Vascular: Major intracranial vascular flow voids are preserved. Skull and upper cervical spine: Negative for age visible cervical spine, bone marrow signal. Sinuses/Orbits: Postoperative changes to both globes, otherwise negative orbits. Paranasal Visualized paranasal sinuses and mastoids are stable and well pneumatized. Other: Grossly normal visible internal auditory structures. Scalp and face soft tissues appear negative. IMPRESSION: 1. No acute intracranial abnormality. 2. 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 Electronically Signed   By: Odessa Fleming M.D.   On: 05/01/2020 15:24   DG Chest Portable 1 View  Result Date: 05/01/2020 CLINICAL DATA:  Syncope EXAM: PORTABLE CHEST 1 VIEW COMPARISON:  Jan 19, 2019 FINDINGS: Stable interstitial thickening bilaterally, primarily in the mid lung regions. No edema or airspace opacity. Heart is borderline enlarged with pulmonary vascularity normal. There is aortic atherosclerosis. No adenopathy. No  bone lesions. IMPRESSION: Interstitial thickening, likely indicative of a degree of chronic bronchitis. No edema or airspace opacity. Stable cardiac prominence. No adenopathy appreciable. Aortic Atherosclerosis (ICD10-I70.0). Electronically Signed   By: Bretta Bang III M.D.   On: 05/01/2020 10:53    Subjective: Seen and examined at bedside and she was much more awake today and felt good.  She was coughing up some sputum.  No nausea or vomiting.  Denies any chest pain, lightheadedness or dizziness or shortness of breath.  No other concerns or complaints at this time is stable to be discharged back to her facility as her restarted vital signs are stable.  Repeat Covid testing is pending  Discharge Exam: Vitals:   05/03/20 2337 05/04/20 0744  BP: 123/82 (!) 127/50  Pulse: 82 80  Resp: 18 16  Temp: 98.6 F (37 C) 98.4 F (36.9 C)  SpO2: 100% 97%   Vitals:   05/03/20  1528 05/03/20 2337 05/04/20 0744 05/04/20 1200  BP: 135/61 123/82 (!) 127/50   Pulse: 71 82 80   Resp: 15 18 16    Temp: 99.4 F (37.4 C) 98.6 F (37 C) 98.4 F (36.9 C)   TempSrc: Oral  Oral   SpO2: 100% 100% 97%   Weight:    57 kg  Height:    5' 5.98" (1.676 m)    General: Pt is alert, awake, not in acute distress Cardiovascular: RRR, S1/S2 +, no rubs, no gallops Respiratory: Diminished bilaterally with slightly coarse breath sounds and some dry crackles on the left upper lobe and some slight wheezing.  No appreciable rales.  Unlabored breathing and not wearing supplemental oxygen is Abdominal: Soft, NT, ND, bowel sounds + Extremities: no edema, no cyanosis  The results of significant diagnostics from this hospitalization (including imaging, microbiology, ancillary and laboratory) are listed below for reference.    Microbiology: Recent Results (from the past 240 hour(s))  Urine Culture     Status: Abnormal   Collection Time: 05/01/20 10:38 AM   Specimen: Urine, Random  Result Value Ref Range Status    Specimen Description   Final    URINE, RANDOM Performed at [facility], [address] [place], [place]    Special Requests   Final    NONE Performed at [facility], [address] [place], [place]    Culture >=100,000 COLONIES/mL ESCHERICHIA COLI (A)  Final   Report Status 05/04/2020 FINAL  Final   Organism ID, Bacteria ESCHERICHIA COLI (A)  Final      Susceptibility   Escherichia coli - MIC*    AMPICILLIN >=32 RESISTANT Resistant     CEFAZOLIN <=4 SENSITIVE Sensitive     CEFTRIAXONE <=0.25 SENSITIVE Sensitive     CIPROFLOXACIN <=0.25 SENSITIVE Sensitive     GENTAMICIN <=1 SENSITIVE Sensitive     IMIPENEM <=0.25 SENSITIVE Sensitive     NITROFURANTOIN <=16 SENSITIVE Sensitive     TRIMETH/SULFA >=320 RESISTANT Resistant     AMPICILLIN/SULBACTAM 16 INTERMEDIATE Intermediate     PIP/TAZO <=4 SENSITIVE Sensitive     * >=100,000 COLONIES/mL ESCHERICHIA COLI  SARS Coronavirus 2 by RT PCR (hospital order, performed in [facility] hospital lab) Nasopharyngeal Nasopharyngeal Swab     Status: None   Collection Time: 05/01/20 10:39 AM   Specimen: Nasopharyngeal Swab  Result Value Ref Range Status   SARS Coronavirus 2 NEGATIVE NEGATIVE Final    Comment: (NOTE) SARS-CoV-2 target nucleic acids are NOT DETECTED.  The SARS-CoV-2 RNA is generally detectable in upper and lower respiratory specimens during the acute phase of infection. The lowest concentration of SARS-CoV-2 viral copies this assay can detect is 250 copies / mL. A negative result does not preclude SARS-CoV-2 infection and should not be used as the sole basis for treatment or other patient management decisions.  A negative result may occur with improper specimen collection / handling, submission of specimen other than nasopharyngeal swab, presence of viral mutation(s) within the areas targeted by this assay, and inadequate number of viral copies (<250 copies / mL). A negative result  must be combined with clinical observations, patient history, and epidemiological information.  Fact Sheet for Patients:   07/01/20  Fact Sheet for Healthcare Providers: BoilerBrush.com.cy  This test is not yet approved or  cleared by the https://pope.com/ FDA and has been authorized for detection and/or diagnosis of SARS-CoV-2 by FDA under an Emergency Use Authorization (EUA).  This EUA will remain  in effect (meaning this test can be used) for the duration of the COVID-19 declaration under Section 564(b)(1) of the Act, 21 U.S.C. section 360bbb-3(b)(1), unless the authorization is terminated or revoked sooner.  Performed at [facility], [address] [place], [place] [address]   CULTURE, BLOOD (ROUTINE X 2) w Reflex to ID Panel     Status: None (Preliminary result)   Collection Time: 05/01/20  6:23 PM   Specimen: BLOOD  Result Value Ref Range Status   Specimen Description BLOOD BLOOD LEFT WRIST  Final   Special Requests   Final    BOTTLES DRAWN AEROBIC AND ANAEROBIC Blood Culture results may not be optimal due to an inadequate volume of blood received in culture bottles   Culture   Final    NO GROWTH 3 DAYS Performed at [facility], [address] [place], [place] [address]    Report Status PENDING  Incomplete  CULTURE, BLOOD (ROUTINE X 2) w Reflex to ID Panel     Status: None (Preliminary result)   Collection Time: 05/01/20  6:28 PM   Specimen: BLOOD  Result Value Ref Range Status   Specimen Description BLOOD LEFT ANTECUBITAL  Final   Special Requests   Final    BOTTLES DRAWN AEROBIC AND ANAEROBIC Blood Culture results may not be optimal due to an inadequate volume of blood received in culture bottles   Culture   Final    NO GROWTH 3 DAYS Performed at [facility], [address] [place], [place] [address]    Report Status PENDING  Incomplete    Labs: BNP (last 3 results) No results  for input(s): BNP in the last 8760 hours. Basic Metabolic Panel: Recent Labs  Lab 05/01/20 1036 05/02/20 0649 05/03/20 0927 05/04/20 0320  NA 135 139 134* 134*  K 3.4* 4.2 3.7 3.6  CL 101 108 102 103  CO2 23 24 23 22   GLUCOSE 259* 120* 147* 119*  BUN 33* 32* 14 12  CREATININE 1.11* 0.94 0.65 0.50  CALCIUM 8.6* 8.6* 8.5* 8.2*  MG  --  1.7 1.6* 1.9  PHOS  --   --  2.5 2.6   Liver Function Tests: Recent Labs  Lab 05/01/20 1036 05/02/20 0649 05/03/20 0927 05/04/20 0320  AST 20 13* 14* 13*  ALT 13 11 12 12   ALKPHOS 53 46 53 48  BILITOT 1.3* 0.9 0.7 0.6  PROT 6.8 6.0* 6.3* 5.8*  ALBUMIN 3.1* 2.7* 2.7* 2.5*   No results for input(s): LIPASE, AMYLASE in the last 168 hours. No results for input(s): AMMONIA in the last 168 hours. CBC: Recent Labs  Lab 05/01/20 1036 05/02/20 0649 05/03/20 0927 05/04/20 0320  WBC 12.0* 7.3 7.0 6.3  NEUTROABS 9.7*  --  5.3 4.5  HGB 10.5* 9.4* 9.8* 9.2*  HCT 30.9* 29.0* 30.3* 26.8*  MCV 82.6 86.3 85.6 83.0  PLT 173 146* 179 184   Cardiac Enzymes: No results for input(s): CKTOTAL, CKMB, CKMBINDEX, TROPONINI in the last 168 hours. BNP: Invalid input(s): POCBNP CBG: Recent Labs  Lab 05/03/20 1642 05/03/20 2115 05/04/20 0743 05/04/20 0959 05/04/20 1141  GLUCAP 112* 173* 118* 137* 170*   D-Dimer No results for input(s): DDIMER in the last 72 hours. Hgb A1c No results for input(s): HGBA1C in the last 72 hours. Lipid Profile No results for input(s): CHOL, HDL, LDLCALC, TRIG, CHOLHDL, LDLDIRECT in the last 72 hours. Thyroid function studies No results for input(s): TSH, T4TOTAL, T3FREE, THYROIDAB in the last 72 hours.  Invalid input(s): FREET3 Anemia work up Recent Labs    05/04/20 0320  VITAMINB12 217  FOLATE 19.2  FERRITIN 250  TIBC 158*  IRON 19*  RETICCTPCT 1.0   Urinalysis    Component Value Date/Time   COLORURINE AMBER (A) 05/01/2020 1038   APPEARANCEUR CLOUDY (A) 05/01/2020 1038   LABSPEC 1.015 05/01/2020 1038    PHURINE 5.0 05/01/2020 1038   GLUCOSEU NEGATIVE 05/01/2020 1038   HGBUR NEGATIVE 05/01/2020 1038   BILIRUBINUR NEGATIVE 05/01/2020 1038   KETONESUR NEGATIVE 05/01/2020 1038   PROTEINUR 30 (A) 05/01/2020 1038   NITRITE NEGATIVE 05/01/2020 1038   LEUKOCYTESUR NEGATIVE 05/01/2020 1038   Sepsis Labs Invalid input(s): PROCALCITONIN,  WBC,  LACTICIDVEN Microbiology Recent Results (from the past 240 hour(s))  Urine Culture     Status: Abnormal   Collection Time: 05/01/20 10:38 AM   Specimen: Urine, Random  Result Value Ref Range Status   Specimen Description   Final    URINE, RANDOM Performed at [facility], [address] [place], [place] [address]    Special Requests   Final    NONE Performed at [facility], [address] [place], [place] [address]    Culture >=100,000 COLONIES/mL ESCHERICHIA COLI (A)  Final   Report Status 05/04/2020 FINAL  Final   Organism ID, Bacteria ESCHERICHIA COLI (A)  Final      Susceptibility   Escherichia coli - MIC*    AMPICILLIN >=32 RESISTANT Resistant     CEFAZOLIN <=4 SENSITIVE Sensitive     CEFTRIAXONE <=0.25 SENSITIVE Sensitive     CIPROFLOXACIN <=0.25 SENSITIVE Sensitive     GENTAMICIN <=1 SENSITIVE Sensitive     IMIPENEM <=0.25 SENSITIVE Sensitive     NITROFURANTOIN <=16 SENSITIVE Sensitive     TRIMETH/SULFA >=320 RESISTANT Resistant     AMPICILLIN/SULBACTAM 16 INTERMEDIATE Intermediate     PIP/TAZO <=4 SENSITIVE Sensitive     * >=100,000 COLONIES/mL ESCHERICHIA COLI  SARS Coronavirus 2 by RT PCR (hospital order, performed in [facility] hospital lab) Nasopharyngeal Nasopharyngeal Swab     Status: None   Collection Time: 05/01/20 10:39 AM   Specimen: Nasopharyngeal Swab  Result Value Ref Range Status   SARS Coronavirus 2 NEGATIVE NEGATIVE Final    Comment: (NOTE) SARS-CoV-2 target nucleic acids are NOT DETECTED.  The SARS-CoV-2 RNA is generally detectable in upper and lower respiratory specimens during the  acute phase of infection. The lowest concentration of SARS-CoV-2 viral copies this assay can detect is 250 copies / mL. A negative result does not preclude SARS-CoV-2 infection and should not be used as the sole basis for treatment or other patient management decisions.  A negative result may occur with improper specimen collection / handling, submission of specimen other than nasopharyngeal swab, presence of viral mutation(s) within the areas targeted by this assay, and inadequate number of viral copies (<250 copies / mL). A negative result must be combined with clinical observations, patient history, and epidemiological information.  Fact Sheet for Patients:   BoilerBrush.com.cy  Fact Sheet for Healthcare Providers: https://pope.com/  This test is not yet approved or  cleared by the [place] FDA and has been authorized for detection and/or diagnosis of SARS-CoV-2 by FDA under an Emergency Use Authorization (EUA).  This EUA will remain in effect (meaning this test can be used) for the duration of the COVID-19 declaration under Section 564(b)(1) of the Act, 21 U.S.C. section 360bbb-3(b)(1), unless the authorization is terminated or revoked sooner.  Performed at [facility], [address] [place], [place] [address]   CULTURE, BLOOD (ROUTINE X 2) w Reflex to ID Panel     Status: None (Preliminary result)   Collection Time: 05/01/20  6:23 PM   Specimen: BLOOD  Result Value Ref Range Status   Specimen Description BLOOD BLOOD LEFT WRIST  Final   Special Requests   Final    BOTTLES DRAWN AEROBIC AND ANAEROBIC Blood Culture results may not be optimal due to an inadequate volume of blood received in culture bottles   Culture   Final    NO GROWTH 3 DAYS Performed at [facility], [address] [place], [place] [address]    Report Status PENDING  Incomplete  CULTURE, BLOOD (ROUTINE X 2) w Reflex to ID Panel      Status: None (Preliminary result)   Collection Time: 05/01/20  6:28 PM   Specimen: BLOOD  Result Value Ref Range Status   Specimen Description BLOOD LEFT ANTECUBITAL  Final   Special Requests   Final    BOTTLES DRAWN AEROBIC AND ANAEROBIC Blood Culture results may not be optimal due to an inadequate volume of blood received in culture bottles   Culture   Final    NO GROWTH 3 DAYS Performed at [facility], [address] [place], [place] [address]    Report Status PENDING  Incomplete   Time coordinating discharge: 35 minutes  SIGNED:  Merlene Laughtermair Latif Latitia Housewright, DO [facility] 05/04/2020, 12:44 PM Pager is on AMION  If 7PM-7AM, please contact night-coverage [email or web address]

## 2020-05-04 NOTE — TOC Progression Note (Signed)
Transition of Care [facility]) - Progression Note    Patient Details  Name: Kim Roy MRN: [id number] Date of Birth: [birth year]  Transition of Care [facility]) CM/SW Contact  Trenton Founds, RN Phone Number: 05/04/2020, 10:08 AM  Clinical Narrative:   RNCM reached out to [facility] with [employer], they will be able to accept patient back today. Requested updated Covid test. EMS paperwork completed.     Expected Discharge Plan: Skilled Nursing Facility Barriers to Discharge: Continued Medical Work up  Expected Discharge Plan and Services Expected Discharge Plan: Skilled Nursing Facility     Post Acute Care Choice: Skilled Nursing Facility Living arrangements for the past 2 months: Skilled Nursing Facility                                       Social Determinants of Health (SDOH) Interventions    Readmission Risk Interventions No flowsheet data found.

## 2020-05-04 NOTE — Plan of Care (Signed)

## 2020-05-04 NOTE — Progress Notes (Signed)
Initial Nutrition Assessment  RD working remotely. RD working on another campus.   DOCUMENTATION CODES:   Not applicable  INTERVENTION:  - will order Ensure Enlive once/day, each supplement provides 350 kcal and 20 grams of protein. - weigh patient today.   NUTRITION DIAGNOSIS:   Increased nutrient needs related to acute illness as evidenced by estimated needs.  GOAL:   Patient will meet greater than or equal to 90% of their needs  MONITOR:   PO intake, Supplement acceptance, Labs, Weight trends  REASON FOR ASSESSMENT:   Malnutrition Screening Tool  ASSESSMENT:   86 y.o. female with medical history of HTN, hyperlipidemia, DM, and stage 3 CKD. She presented to the ED with AMS and syncope. She is from a facility where she is noted to typically be a/o x3. Mild abdominal pain noted in the ED but no active N/V/D at that time.  Patient noted to be back to baseline mentation (a/o x3) yesterday at 2200. She ate 100% of breakfast this AM which provided 496 kcal, 26 grams protein. She has not been weighed since 01/19/19 so placed order for patient to be weighed today.   Per notes: - acute metabolic encephalopathy thought to be d/t possible UTI  - s/p syncope s/p CT and MRI head--negative for stroke - AKI on stage 3 CKD   Labs reviewed; CBGs: 118, 137, 170 mg/dl, Na: 638 mmol/l, Ca: 8.2 mg/dl. Medications reviewed; 1000 units cholecalciferol/day, sliding scale novolog, 2 g IV Mg sulfate x1 run 9/12.    NUTRITION - FOCUSED PHYSICAL EXAM:  unable to complete at this time.   Diet Order:   Diet Order            Diet heart healthy/carb modified Room service appropriate? Yes; Fluid consistency: Thin  Diet effective now                 EDUCATION NEEDS:   No education needs have been identified at this time  Skin:  Skin Assessment: Reviewed RN Assessment  Last BM:  PTA/unknown  Height:   Ht Readings from Last 1 Encounters:  01/19/19 5\' 6"  (1.676 m)    Weight:   Wt  Readings from Last 1 Encounters:  01/19/19 60.8 kg    Estimated Nutritional Needs:  Kcal:  1300-1500 kcal Protein:  65-75 grams Fluid:  >/= 1.7 L/day     01/21/19, MS, RD, LDN, CNSC Inpatient Clinical Dietitian RD pager # available in AMION  After hours/weekend pager # available in [facility]

## 2020-05-06 LAB — CULTURE, BLOOD (ROUTINE X 2)
Culture: NO GROWTH
Culture: NO GROWTH

## 2020-10-18 ENCOUNTER — Emergency Department: Payer: Medicare (Managed Care)

## 2020-10-18 ENCOUNTER — Inpatient Hospital Stay
Admission: EM | Admit: 2020-10-18 | Discharge: 2020-10-28 | DRG: 871 | Disposition: A | Discharge status: Skilled Nursing Facility | Payer: Medicare (Managed Care) | Attending: Internal Medicine | Admitting: Internal Medicine

## 2020-10-18 ENCOUNTER — Other Ambulatory Visit: Payer: Self-pay

## 2020-10-18 DIAGNOSIS — Z7189 Other specified counseling: Secondary | ICD-10-CM

## 2020-10-18 DIAGNOSIS — F05 Delirium due to known physiological condition: Secondary | ICD-10-CM | POA: Diagnosis present

## 2020-10-18 DIAGNOSIS — E43 Unspecified severe protein-calorie malnutrition: Secondary | ICD-10-CM | POA: Diagnosis present

## 2020-10-18 DIAGNOSIS — D61818 Other pancytopenia: Secondary | ICD-10-CM | POA: Diagnosis present

## 2020-10-18 DIAGNOSIS — I129 Hypertensive chronic kidney disease with stage 1 through stage 4 chronic kidney disease, or unspecified chronic kidney disease: Secondary | ICD-10-CM | POA: Diagnosis present

## 2020-10-18 DIAGNOSIS — E1165 Type 2 diabetes mellitus with hyperglycemia: Secondary | ICD-10-CM | POA: Diagnosis not present

## 2020-10-18 DIAGNOSIS — Z6822 Body mass index (BMI) 22.0-22.9, adult: Secondary | ICD-10-CM

## 2020-10-18 DIAGNOSIS — R627 Adult failure to thrive: Secondary | ICD-10-CM | POA: Diagnosis present

## 2020-10-18 DIAGNOSIS — N1832 Chronic kidney disease, stage 3b: Secondary | ICD-10-CM | POA: Diagnosis not present

## 2020-10-18 DIAGNOSIS — K8070 Calculus of gallbladder and bile duct without cholecystitis without obstruction: Secondary | ICD-10-CM | POA: Diagnosis present

## 2020-10-18 DIAGNOSIS — R41840 Attention and concentration deficit: Secondary | ICD-10-CM | POA: Diagnosis not present

## 2020-10-18 DIAGNOSIS — I2510 Atherosclerotic heart disease of native coronary artery without angina pectoris: Secondary | ICD-10-CM | POA: Diagnosis present

## 2020-10-18 DIAGNOSIS — Z20822 Contact with and (suspected) exposure to covid-19: Secondary | ICD-10-CM | POA: Diagnosis present

## 2020-10-18 DIAGNOSIS — L89302 Pressure ulcer of unspecified buttock, stage 2: Secondary | ICD-10-CM | POA: Diagnosis not present

## 2020-10-18 DIAGNOSIS — J690 Pneumonitis due to inhalation of food and vomit: Secondary | ICD-10-CM | POA: Diagnosis present

## 2020-10-18 DIAGNOSIS — Z66 Do not resuscitate: Secondary | ICD-10-CM | POA: Diagnosis present

## 2020-10-18 DIAGNOSIS — E870 Hyperosmolality and hypernatremia: Secondary | ICD-10-CM

## 2020-10-18 DIAGNOSIS — N179 Acute kidney failure, unspecified: Secondary | ICD-10-CM | POA: Diagnosis present

## 2020-10-18 DIAGNOSIS — Z515 Encounter for palliative care: Secondary | ICD-10-CM | POA: Diagnosis not present

## 2020-10-18 DIAGNOSIS — E785 Hyperlipidemia, unspecified: Secondary | ICD-10-CM | POA: Diagnosis present

## 2020-10-18 DIAGNOSIS — E872 Acidosis: Secondary | ICD-10-CM | POA: Diagnosis present

## 2020-10-18 DIAGNOSIS — E876 Hypokalemia: Secondary | ICD-10-CM | POA: Diagnosis not present

## 2020-10-18 DIAGNOSIS — F0390 Unspecified dementia without behavioral disturbance: Secondary | ICD-10-CM | POA: Diagnosis present

## 2020-10-18 DIAGNOSIS — Z7982 Long term (current) use of aspirin: Secondary | ICD-10-CM

## 2020-10-18 DIAGNOSIS — E1129 Type 2 diabetes mellitus with other diabetic kidney complication: Secondary | ICD-10-CM | POA: Diagnosis present

## 2020-10-18 DIAGNOSIS — E162 Hypoglycemia, unspecified: Secondary | ICD-10-CM | POA: Diagnosis not present

## 2020-10-18 DIAGNOSIS — E860 Dehydration: Secondary | ICD-10-CM | POA: Diagnosis present

## 2020-10-18 DIAGNOSIS — D649 Anemia, unspecified: Secondary | ICD-10-CM

## 2020-10-18 DIAGNOSIS — A419 Sepsis, unspecified organism: Principal | ICD-10-CM

## 2020-10-18 DIAGNOSIS — N1831 Chronic kidney disease, stage 3a: Secondary | ICD-10-CM | POA: Diagnosis present

## 2020-10-18 DIAGNOSIS — E1122 Type 2 diabetes mellitus with diabetic chronic kidney disease: Secondary | ICD-10-CM | POA: Diagnosis present

## 2020-10-18 DIAGNOSIS — D631 Anemia in chronic kidney disease: Secondary | ICD-10-CM | POA: Diagnosis present

## 2020-10-18 DIAGNOSIS — K8050 Calculus of bile duct without cholangitis or cholecystitis without obstruction: Secondary | ICD-10-CM | POA: Diagnosis present

## 2020-10-18 DIAGNOSIS — Z79899 Other long term (current) drug therapy: Secondary | ICD-10-CM

## 2020-10-18 DIAGNOSIS — G9341 Metabolic encephalopathy: Secondary | ICD-10-CM | POA: Diagnosis not present

## 2020-10-18 DIAGNOSIS — R4182 Altered mental status, unspecified: Secondary | ICD-10-CM | POA: Diagnosis not present

## 2020-10-18 DIAGNOSIS — E11649 Type 2 diabetes mellitus with hypoglycemia without coma: Secondary | ICD-10-CM | POA: Diagnosis not present

## 2020-10-18 DIAGNOSIS — Z7984 Long term (current) use of oral hypoglycemic drugs: Secondary | ICD-10-CM | POA: Diagnosis not present

## 2020-10-18 DIAGNOSIS — Z8673 Personal history of transient ischemic attack (TIA), and cerebral infarction without residual deficits: Secondary | ICD-10-CM

## 2020-10-18 DIAGNOSIS — R6521 Severe sepsis with septic shock: Secondary | ICD-10-CM | POA: Diagnosis present

## 2020-10-18 DIAGNOSIS — Z720 Tobacco use: Secondary | ICD-10-CM

## 2020-10-18 DIAGNOSIS — L8990 Pressure ulcer of unspecified site, unspecified stage: Secondary | ICD-10-CM | POA: Insufficient documentation

## 2020-10-18 HISTORY — DX: Bronchitis, not specified as acute or chronic: J40

## 2020-10-18 HISTORY — DX: Metabolic encephalopathy: G93.41

## 2020-10-18 HISTORY — DX: Anemia in chronic kidney disease: N18.9

## 2020-10-18 HISTORY — DX: Transient cerebral ischemic attack, unspecified: G45.9

## 2020-10-18 HISTORY — DX: Atherosclerotic heart disease of native coronary artery without angina pectoris: I25.10

## 2020-10-18 HISTORY — DX: Hyperlipidemia, unspecified: E78.5

## 2020-10-18 HISTORY — DX: Gout, unspecified: M10.9

## 2020-10-18 HISTORY — DX: Thrombocytopenia, unspecified: D69.6

## 2020-10-18 HISTORY — DX: Vitamin D deficiency, unspecified: E55.9

## 2020-10-18 HISTORY — DX: Anemia in chronic kidney disease: D63.1

## 2020-10-18 LAB — URINALYSIS, ROUTINE W REFLEX MICROSCOPIC
Bilirubin Urine: NEGATIVE
Glucose, UA: 50 mg/dL — AB
Hgb urine dipstick: NEGATIVE
Ketones, ur: NEGATIVE mg/dL
Leukocytes,Ua: NEGATIVE
Nitrite: NEGATIVE
Protein, ur: >=300 mg/dL — AB
Specific Gravity, Urine: 1.025 (ref 1.005–1.030)
pH: 5 (ref 5.0–8.0)

## 2020-10-18 LAB — COMPREHENSIVE METABOLIC PANEL
ALT: 7 U/L (ref 0–44)
AST: 18 U/L (ref 15–41)
Albumin: 2 g/dL — ABNORMAL LOW (ref 3.5–5.0)
Alkaline Phosphatase: 31 U/L — ABNORMAL LOW (ref 38–126)
Anion gap: 13 (ref 5–15)
BUN: 54 mg/dL — ABNORMAL HIGH (ref 8–23)
CO2: 12 mmol/L — ABNORMAL LOW (ref 22–32)
Calcium: 6.7 mg/dL — ABNORMAL LOW (ref 8.9–10.3)
Chloride: 117 mmol/L — ABNORMAL HIGH (ref 98–111)
Creatinine, Ser: 2.37 mg/dL — ABNORMAL HIGH (ref 0.44–1.00)
GFR, Estimated: 19 mL/min — ABNORMAL LOW (ref >60)
Glucose, Bld: 66 mg/dL — ABNORMAL LOW (ref 70–99)
Potassium: 3.1 mmol/L — ABNORMAL LOW (ref 3.5–5.1)
Sodium: 142 mmol/L (ref 135–145)
Total Bilirubin: 1 mg/dL (ref 0.3–1.2)
Total Protein: 4.1 g/dL — ABNORMAL LOW (ref 6.5–8.1)

## 2020-10-18 LAB — CBC WITH DIFFERENTIAL/PLATELET
Abs Immature Granulocytes: 0.02 10*3/uL (ref 0.00–0.07)
Basophils Absolute: 0 10*3/uL (ref 0.0–0.1)
Basophils Relative: 1 %
Eosinophils Absolute: 0.1 10*3/uL (ref 0.0–0.5)
Eosinophils Relative: 1 %
HCT: 24.5 % — ABNORMAL LOW (ref 36.0–46.0)
Hemoglobin: 7.4 g/dL — ABNORMAL LOW (ref 12.0–15.0)
Immature Granulocytes: 0 %
Lymphocytes Relative: 30 %
Lymphs Abs: 1.8 10*3/uL (ref 0.7–4.0)
MCH: 28.6 pg (ref 26.0–34.0)
MCHC: 30.2 g/dL (ref 30.0–36.0)
MCV: 94.6 fL (ref 80.0–100.0)
Monocytes Absolute: 0.3 10*3/uL (ref 0.1–1.0)
Monocytes Relative: 6 %
Neutro Abs: 3.6 10*3/uL (ref 1.7–7.7)
Neutrophils Relative %: 62 %
Platelets: 133 10*3/uL — ABNORMAL LOW (ref 150–400)
RBC: 2.59 MIL/uL — ABNORMAL LOW (ref 3.87–5.11)
RDW: 15.1 % (ref 11.5–15.5)
WBC: 5.8 10*3/uL (ref 4.0–10.5)
nRBC: 0 % (ref 0.0–0.2)

## 2020-10-18 LAB — RESP PANEL BY RT-PCR (FLU A&B, COVID) ARPGX2
Influenza A by PCR: NEGATIVE
Influenza B by PCR: NEGATIVE
SARS Coronavirus 2 by RT PCR: NEGATIVE

## 2020-10-18 LAB — LACTIC ACID, PLASMA
Lactic Acid, Venous: 5.4 mmol/L (ref 0.5–1.9)
Lactic Acid, Venous: 6.9 mmol/L (ref 0.5–1.9)
Lactic Acid, Venous: 7.5 mmol/L (ref 0.5–1.9)
Lactic Acid, Venous: 2.3 mmol/L (ref 0.5–1.9)

## 2020-10-18 LAB — TROPONIN I (HIGH SENSITIVITY)
Troponin I (High Sensitivity): 17 ng/L (ref <18)
Troponin I (High Sensitivity): 16 ng/L (ref <18)

## 2020-10-18 LAB — CBG MONITORING, ED
Glucose-Capillary: 51 mg/dL — ABNORMAL LOW (ref 70–99)
Glucose-Capillary: 76 mg/dL (ref 70–99)
Glucose-Capillary: 158 mg/dL — ABNORMAL HIGH (ref 70–99)
Glucose-Capillary: 188 mg/dL — ABNORMAL HIGH (ref 70–99)
Glucose-Capillary: 99 mg/dL (ref 70–99)

## 2020-10-18 LAB — LIPASE, BLOOD: Lipase: 24 U/L (ref 11–51)

## 2020-10-18 LAB — PROCALCITONIN: Procalcitonin: 0.1 ng/mL

## 2020-10-18 LAB — GLUCOSE, CAPILLARY
Glucose-Capillary: 144 mg/dL — ABNORMAL HIGH (ref 70–99)
Glucose-Capillary: 131 mg/dL — ABNORMAL HIGH (ref 70–99)
Glucose-Capillary: 154 mg/dL — ABNORMAL HIGH (ref 70–99)
Glucose-Capillary: 52 mg/dL — ABNORMAL LOW (ref 70–99)
Glucose-Capillary: 61 mg/dL — ABNORMAL LOW (ref 70–99)
Glucose-Capillary: 94 mg/dL (ref 70–99)
Glucose-Capillary: 91 mg/dL (ref 70–99)

## 2020-10-18 LAB — AMMONIA: Ammonia: 26 umol/L (ref 9–35)

## 2020-10-18 MED ORDER — LACTATED RINGERS IV BOLUS
1000.0000 mL | Freq: Once | INTRAVENOUS | Status: AC
  Administered 2020-10-18: 1000 mL via INTRAVENOUS

## 2020-10-18 MED ORDER — POLYETHYLENE GLYCOL 3350 17 G PO PACK: 17.0000 g | PACK | Freq: Every day | ORAL | Status: DC | PRN

## 2020-10-18 MED ORDER — VANCOMYCIN HCL IN DEXTROSE 1-5 GM/200ML-% IV SOLN
1000.0000 mg | Freq: Once | INTRAVENOUS | Status: DC
  Filled 2020-10-18: qty 200

## 2020-10-18 MED ORDER — ATORVASTATIN CALCIUM 10 MG PO TABS
40.0000 mg | ORAL_TABLET | Freq: Every day | ORAL | Status: DC
  Administered 2020-10-27 – 2020-10-28 (×2): 40 mg via ORAL
  Filled 2020-10-18: qty 4
  Filled 2020-10-18 (×4): qty 2

## 2020-10-18 MED ORDER — SODIUM CHLORIDE 0.9 % IV SOLN
2.0000 g | Freq: Once | INTRAVENOUS | Status: AC
  Administered 2020-10-18: 2 g via INTRAVENOUS
  Filled 2020-10-18: qty 2

## 2020-10-18 MED ORDER — DOCUSATE SODIUM 100 MG PO CAPS: 100.0000 mg | ORAL_CAPSULE | Freq: Two times a day (BID) | ORAL | Status: DC | PRN

## 2020-10-18 MED ORDER — CHLORHEXIDINE GLUCONATE CLOTH 2 % EX PADS
6.0000 | MEDICATED_PAD | Freq: Every day | CUTANEOUS | Status: DC
  Administered 2020-10-18 – 2020-10-20 (×3): 6 via TOPICAL

## 2020-10-18 MED ORDER — METRONIDAZOLE IN NACL 5-0.79 MG/ML-% IV SOLN
500.0000 mg | Freq: Three times a day (TID) | INTRAVENOUS | Status: DC
  Administered 2020-10-18 – 2020-10-19 (×4): 500 mg via INTRAVENOUS
  Filled 2020-10-18 (×5): qty 100

## 2020-10-18 MED ORDER — HEPARIN SODIUM (PORCINE) 5000 UNIT/ML IJ SOLN
5000.0000 [IU] | Freq: Three times a day (TID) | INTRAMUSCULAR | Status: DC
  Administered 2020-10-18 – 2020-10-27 (×27): 5000 [IU] via SUBCUTANEOUS
  Filled 2020-10-18 (×26): qty 1

## 2020-10-18 MED ORDER — DEXTROSE 50 % IV SOLN: 1.0000 | Freq: Once | INTRAVENOUS | Status: AC

## 2020-10-18 MED ORDER — SODIUM CHLORIDE 0.9 % IV SOLN
2.0000 g | INTRAVENOUS | Status: DC
  Administered 2020-10-18 – 2020-10-19 (×2): 2 g via INTRAVENOUS
  Filled 2020-10-18 (×2): qty 2

## 2020-10-18 MED ORDER — ASPIRIN 325 MG PO TABS: 325.0000 mg | ORAL_TABLET | Freq: Every day | ORAL | Status: DC

## 2020-10-18 MED ORDER — POTASSIUM CHLORIDE 10 MEQ/100ML IV SOLN
10.0000 meq | INTRAVENOUS | Status: AC
  Administered 2020-10-18 (×3): 10 meq via INTRAVENOUS
  Filled 2020-10-18 (×3): qty 100

## 2020-10-18 MED ORDER — DEXTROSE 50 % IV SOLN: 25.0000 g | INTRAVENOUS | Status: AC

## 2020-10-18 MED ORDER — LACTATED RINGERS IV SOLN: INTRAVENOUS | Status: AC

## 2020-10-18 MED ORDER — HYDROCORTISONE NA SUCCINATE PF 100 MG IJ SOLR
50.0000 mg | Freq: Four times a day (QID) | INTRAMUSCULAR | Status: DC
  Administered 2020-10-18 – 2020-10-19 (×4): 50 mg via INTRAVENOUS
  Filled 2020-10-18 (×4): qty 2

## 2020-10-18 MED ORDER — NOREPINEPHRINE 4 MG/250ML-% IV SOLN
0.0000 ug/min | INTRAVENOUS | Status: DC
  Administered 2020-10-18: 2 ug/min via INTRAVENOUS
  Filled 2020-10-18: qty 250

## 2020-10-18 MED ORDER — ASPIRIN 81 MG PO CHEW
81.0000 mg | CHEWABLE_TABLET | Freq: Every day | ORAL | Status: DC
  Administered 2020-10-27 – 2020-10-28 (×2): 81 mg via ORAL
  Filled 2020-10-18 (×5): qty 1

## 2020-10-18 MED ORDER — PANTOPRAZOLE SODIUM 40 MG IV SOLR
40.0000 mg | INTRAVENOUS | Status: DC
  Administered 2020-10-18 – 2020-10-19 (×2): 40 mg via INTRAVENOUS
  Filled 2020-10-18 (×2): qty 40

## 2020-10-18 MED ORDER — VANCOMYCIN HCL 1250 MG/250ML IV SOLN
1250.0000 mg | Freq: Once | INTRAVENOUS | Status: AC
  Administered 2020-10-18: 1250 mg via INTRAVENOUS
  Filled 2020-10-18: qty 250

## 2020-10-18 MED ORDER — LACTATED RINGERS IV SOLN: INTRAVENOUS | Status: DC

## 2020-10-18 MED ORDER — DEXTROSE 50 % IV SOLN
INTRAVENOUS | Status: AC
  Administered 2020-10-18: 25 g via INTRAVENOUS
  Filled 2020-10-18: qty 50

## 2020-10-18 MED ORDER — METRONIDAZOLE IN NACL 5-0.79 MG/ML-% IV SOLN
500.0000 mg | Freq: Once | INTRAVENOUS | Status: AC
  Administered 2020-10-18: 500 mg via INTRAVENOUS
  Filled 2020-10-18: qty 100

## 2020-10-18 MED ORDER — DEXTROSE 50 % IV SOLN
INTRAVENOUS | Status: AC
  Administered 2020-10-18: 50 mL via INTRAVENOUS
  Filled 2020-10-18: qty 50

## 2020-10-18 MED ORDER — LACTATED RINGERS IV BOLUS (SEPSIS)
1000.0000 mL | Freq: Once | INTRAVENOUS | Status: AC
  Administered 2020-10-18: 1000 mL via INTRAVENOUS

## 2020-10-18 NOTE — ED Notes (Signed)
Patient transported to CT. Per provider, pt stable to go with tech. Patient family at bedside with provider and updated on plan of care and pt condition.

## 2020-10-18 NOTE — Progress Notes (Signed)
CODE SEPSIS - PHARMACY COMMUNICATION  **Broad Spectrum Antibiotics should be administered within 1 hour of Sepsis diagnosis**  Time Code Sepsis Called/Page Received: 0227  Antibiotics Ordered: Cefepime and Vancomycin  Time of 1st antibiotic administration: 0257  Otelia Sergeant, PharmD, [facility] 10/18/2020 2:33 AM

## 2020-10-18 NOTE — Progress Notes (Signed)
Neuro: patient oriented to person and she knows she is in a hospital, stated she was at [facility] in [place] and it is the year 1999.  Resp:Stable on room air, no desats   CV: normothermic, no signs of edema, vital signs stable with intermittent hypotension GIGU: Purewick placed, rectal temp probe inserted and tolerated, NPO Skin: very small area on sacrum prehospital skin injury, otherwise intact Social: no contact with family for update.  Events: no events or deviations

## 2020-10-18 NOTE — ED Triage Notes (Signed)
Pt is a 87 y/o f coming in via EMS from a SNF with a cc of hypotension and altered mental status. Per staff report, they went to change the patient and she was found less responsive than normal. Upon medic arrival, a bp of 60/30 was obtained. IV access established, of NS was given with improvement to 100s systolic. Patient nods appropriately to all questions being asked.

## 2020-10-18 NOTE — Progress Notes (Signed)
Pt being followed by [facility] for sepsis protocol. 

## 2020-10-18 NOTE — Consult Note (Signed)
PHARMACY CONSULT NOTE - FOLLOW UP  Pharmacy Consult for Electrolyte Monitoring and Replacement   Recent Labs: Potassium (mmol/L)  Date Value  10/18/2020 3.1 (L)   Magnesium (mg/dL)  Date Value  57/47/3403 1.9   Calcium (mg/dL)  Date Value  70/96/4383 6.7 (L)   Albumin (g/dL)  Date Value  81/84/0375 2.0 (L)   Phosphorus (mg/dL)  Date Value  43/60/6770 2.6   Sodium (mmol/L)  Date Value  10/18/2020 142   Corrected Ca 8.3.   Assessment: 87 year old female admitted with aspiration pneumonia & choledocholithiasis in septic shock requiring vasopressors admitted to ICU.  Goal of Therapy:  WNL  Plan:  Will give KCl 10 mEq IV x 3.  F/u with AM labs. Monitor closely due to renal function.   Ronnald Ramp ,PharmD Clinical Pharmacist 10/18/2020 7:40 AM

## 2020-10-18 NOTE — Progress Notes (Signed)
Per Dr. Belia Heman, ok to titrate levophed for a MAP goal of 60

## 2020-10-18 NOTE — Progress Notes (Addendum)
Family Notification: Kim Roy present with patient in emergency department. Given updates via phone this morning.

## 2020-10-18 NOTE — Progress Notes (Signed)
PHARMACY -  BRIEF ANTIBIOTIC NOTE   Pharmacy has received consult(s) for Cefepime and Vancomycin from an ED provider.  The patient's profile has been reviewed for ht/wt/allergies/indication/available labs.    One time order(s) placed for Cefepime 2 gm and Vancomycin 1250 mg based on pt wt 56.7 kg  Further antibiotics/pharmacy consults should be ordered by admitting physician if indicated.       Otelia Sergeant, PharmD, [facility] 10/18/2020 2:43 AM

## 2020-10-18 NOTE — ED Provider Notes (Addendum)
[facility] Emergency Department Provider Note  ____________________________________________   Event Date/Time   First MD Initiated Contact with Patient 10/18/20 0133     (approximate)  I have reviewed the triage vital signs and the nursing notes.   HISTORY  Chief Complaint Hypotension and Altered Mental Status    HPI Kim Roy is a 87 y.o. female with history of hypertension, hyperlipidemia, diabetes who presents to the emergency department with EMS from [facility] nursing facility.  EMS reports patient here with altered mental status and was found to be hypotensive.  Has received 500 mL of IV fluids in route and BP improved to 90s systolic.  No known fever.  Spoke with nurse at [facility].  They report tonight patient was slow to respond and she is normally able to talk and carry on a conversation.  No known history of dementia.  They report her blood pressure was 63/28 and she felt cool to touch.  Last known well by EMS was 8 PM however nurse denies that and states she is not sure of LKW.  No known cough, vomiting but did have diarrhea today.  Blood sugar normal with EMS.      Past Medical History:  Diagnosis Date  . Anemia in CKD (chronic kidney disease)   . Bronchitis   . Coronary artery disease   . Diabetes mellitus without complication (HCC)   . Gout   . HTN (hypertension)   . Hyperlipemia   . Metabolic encephalopathy   . Thrombocytopenia (HCC)   . TIA (transient ischemic attack)   . Vitamin D deficiency     Patient Active Problem List   Diagnosis Date Noted  . Sepsis (HCC) 10/18/2020  . Syncope 05/01/2020  . UTI (urinary tract infection) 05/01/2020  . Acute metabolic encephalopathy 05/01/2020  . HLD (hyperlipidemia) 05/01/2020  . Hypokalemia 05/01/2020  . CKD (chronic kidney disease), stage IIIa 05/01/2020  . Closed left hip fracture (HCC) 01/19/2019  . Type II diabetes mellitus with renal manifestations (HCC)  01/19/2019  . HTN (hypertension) 01/19/2019    Past Surgical History:  Procedure Laterality Date  . FRACTURE SURGERY    . HIP PINNING,CANNULATED Left 01/20/2019   Procedure: CANNULATED HIP PINNING;  Surgeon: Kennedy Bucker, MD;  Location: [facility] ORS;  Service: Orthopedics;  Laterality: Left;    Prior to Admission medications   Medication Sig Start Date End Date Taking? Authorizing Provider  acetaminophen (TYLENOL) 325 MG tablet Take 2 tablets (650 mg total) by mouth every 6 (six) hours as needed for mild pain (or Fever >/= 101). 01/23/19  Yes Auburn Bilberry, MD  aspirin EC 81 MG tablet Take 81 mg by mouth daily. Swallow whole.   Yes [provider]  atorvastatin (LIPITOR) 40 MG tablet Take 40 mg by mouth daily. 01/12/19  Yes [provider]  carvedilol (COREG) 12.5 MG tablet Take 12.5 mg by mouth 2 (two) times a day. 04/20/15  Yes [provider]  Cholecalciferol 25 MCG (1000 UT) tablet Take 1,000 Units by mouth daily.   Yes [provider]  iron polysaccharides (NIFEREX) 150 MG capsule Take 1 capsule (150 mg total) by mouth daily. 05/04/20  Yes Sheikh, Omair Latif, DO  lisinopril-hydrochlorothiazide (ZESTORETIC) 10-12.5 MG tablet Take 1 tablet by mouth daily. 05/04/20 05/04/21 Yes Sheikh, Omair Latif, DO  metFORMIN (GLUCOPHAGE) 500 MG tablet Take 500 mg by mouth 2 (two) times daily with a meal. 04/07/15  Yes [provider]  mirtazapine (REMERON) 7.5 MG tablet  Take 7.5 mg by mouth at bedtime. 10/14/20  Yes [provider]  pantoprazole (PROTONIX) 40 MG tablet Take 40 mg by mouth 2 (two) times daily. 09/23/20  Yes [provider]  senna (SENOKOT) 8.6 MG tablet Take 2 tablets by mouth 2 (two) times daily.   Yes [provider]  amLODipine (NORVASC) 10 MG tablet Take 10 mg by mouth daily. Patient not taking: Reported on 10/18/2020    [provider]  aspirin EC 325 MG EC tablet Take 1 tablet (325 mg total) by mouth daily with  breakfast. Patient not taking: Reported on 10/18/2020 01/23/19   Evon Slack, PA-C  cetirizine (ZYRTEC) 10 MG tablet Take 10 mg by mouth daily. Patient not taking: No sig reported    [provider]  docusate sodium (COLACE) 100 MG capsule Take 1 capsule (100 mg total) by mouth 2 (two) times daily. Patient not taking: Reported on 05/01/2020 01/23/19   Auburn Bilberry, MD  feeding supplement, ENSURE ENLIVE, (ENSURE ENLIVE) LIQD Take 237 mLs by mouth daily. 05/04/20   Sheikh, Omair Latif, DO  ondansetron (ZOFRAN) 4 MG tablet Take 1 tablet (4 mg total) by mouth every 6 (six) hours as needed for nausea. Patient not taking: No sig reported 05/04/20   Merlene Laughter, DO    Allergies Patient has no known allergies.  History reviewed. No pertinent family history.  Social History Social History   Tobacco Use  . Smoking status: Never Smoker  . Smokeless tobacco: Current User    Types: Snuff  Substance Use Topics  . Alcohol use: No  . Drug use: No    Review of Systems Level 5 caveat secondary to altered mental status ____________________________________________   PHYSICAL EXAM:  VITAL SIGNS: . Vitals:   10/18/20 0505 10/18/20 0510  BP: (!) 84/44 (!) 90/50  Pulse: 77 74  Resp: 18 16  Temp:    SpO2: 99% 100%                                                  CONSTITUTIONAL: Alert thin, elderly, chronically ill-appearing.  Eyes open and will shake her head yes or no but does not answer questions.  Will follow some commands intermittently. HEAD: Normocephalic, appears atraumatic EYES: Conjunctivae clear, pupils appear equal, EOM appear intact ENT: normal nose; moist mucous membranes NECK: Supple, normal ROM CARD: RRR; S1 and S2 appreciated; no murmurs, no clicks, no rubs, no gallops RESP: Normal chest excursion without splinting or tachypnea; breath sounds clear and equal bilaterally; no wheezes, no rhonchi, no rales, no hypoxia or respiratory distress,  speaking full sentences ABD/GI: Normal bowel sounds; non-distended; soft, non-tender, no rebound, no guarding, no peritoneal signs, no hepatosplenomegaly RECTAL:  Normal rectal tone, no gross blood or melena, guaiac NEGATIVE, no hemorrhoids appreciated, nontender rectal exam, no fecal impaction. Chaperone present. BACK: The back appears normal EXT: Normal ROM in all joints; no deformity noted, no edema; no cyanosis, I am able to palpate DP and PT pulses bilaterally SKIN: Normal color for age and race; warm; skin is cool to touch NEURO: Moves all extremities equally, no facial asymmetry, patient does not talk or answer questions while I am in the room   ____________________________________________   LABS (all labs ordered are listed, but only abnormal results are displayed)  Labs Reviewed  URINALYSIS, ROUTINE W REFLEX MICROSCOPIC -  Abnormal; Notable for the following components:      Result Value   Color, Urine AMBER (*)    APPearance CLOUDY (*)    Glucose, UA 50 (*)    Protein, ur >=300 (*)    Bacteria, UA MANY (*)    All other components within normal limits  LACTIC ACID, PLASMA - Abnormal; Notable for the following components:   Lactic Acid, Venous 5.4 (*)    All other components within normal limits  LACTIC ACID, PLASMA - Abnormal; Notable for the following components:   Lactic Acid, Venous 6.9 (*)    All other components within normal limits  CBC WITH DIFFERENTIAL/PLATELET - Abnormal; Notable for the following components:   RBC 2.59 (*)    Hemoglobin 7.4 (*)    HCT 24.5 (*)    Platelets 133 (*)    All other components within normal limits  COMPREHENSIVE METABOLIC PANEL - Abnormal; Notable for the following components:   Potassium 3.1 (*)    Chloride 117 (*)    CO2 12 (*)    Glucose, Bld 66 (*)    BUN 54 (*)    Creatinine, Ser 2.37 (*)    Calcium 6.7 (*)    Total Protein 4.1 (*)    Albumin 2.0 (*)    Alkaline Phosphatase 31 (*)    GFR, Estimated 19 (*)    All other  components within normal limits  CBG MONITORING, ED - Abnormal; Notable for the following components:   Glucose-Capillary 51 (*)    All other components within normal limits  CBG MONITORING, ED - Abnormal; Notable for the following components:   Glucose-Capillary 158 (*)    All other components within normal limits  CBG MONITORING, ED - Abnormal; Notable for the following components:   Glucose-Capillary 188 (*)    All other components within normal limits  CULTURE, BLOOD (ROUTINE X 2)  CULTURE, BLOOD (ROUTINE X 2)  RESP PANEL BY RT-PCR (FLU A&B, COVID) [id number]  URINE CULTURE  AMMONIA  LIPASE, BLOOD  PROCALCITONIN  LACTIC ACID, PLASMA  CBG MONITORING, ED  CBG MONITORING, ED  CBG MONITORING, ED  CBG MONITORING, ED  CBG MONITORING, ED  CBG MONITORING, ED  CBG MONITORING, ED  CBG MONITORING, ED  CBG MONITORING, ED  CBG MONITORING, ED  CBG MONITORING, ED  CBG MONITORING, ED  CBG MONITORING, ED  CBG MONITORING, ED  CBG MONITORING, ED  CBG MONITORING, ED  CBG MONITORING, ED  CBG MONITORING, ED  CBG MONITORING, ED  TROPONIN I (HIGH SENSITIVITY)  TROPONIN I (HIGH SENSITIVITY)   ____________________________________________  EKG   EKG Interpretation  Date/Time:  Sunday October 18 2020 01:36:58 EST Ventricular Rate:  69 PR Interval:    QRS Duration: 132 QT Interval:  454 QTC Calculation: 487 R Axis:   0 Text Interpretation: Sinus rhythm Short PR interval Left atrial enlargement Nonspecific intraventricular conduction delay Baseline wander in lead(s) V1 Confirmed by Rochele Raring [phone]) on 10/18/2020 6:22:37 AM       ____________________________________________  RADIOLOGY Normajean Baxter Ward, personally viewed and evaluated these images (plain radiographs) as part of my medical decision making, as well as reviewing the written report by the radiologist.  ED MD interpretation: Imaging concerning for right-sided aspiration pneumonia.  Head CT shows no acute  abnormality.  Official radiology report(s): CT ABDOMEN PELVIS WO CONTRAST  Result Date: 10/18/2020 CLINICAL DATA:  Sepsis, lactic acidosis EXAM: CT CHEST, ABDOMEN AND PELVIS WITHOUT CONTRAST TECHNIQUE: Multidetector CT imaging of the chest, abdomen and  pelvis was performed following the standard protocol without IV contrast. COMPARISON:  None. FINDINGS: CT CHEST FINDINGS Cardiovascular: Extensive multi-vessel coronary artery calcification. Cardiac size within normal limits. No pericardial effusion. Central pulmonary arteries are enlarged in keeping with changes of pulmonary arterial hypertension. Extensive calcified atherosclerotic plaque and arteriosclerosis is seen involving the thoracic aorta. No aortic aneurysm. Mediastinum/Nodes: No pathologic thoracic adenopathy. Esophagus unremarkable. Thyroid unremarkable. Lungs/Pleura: There is debris within the a right mainstem bronchus and peripheral airway impaction within the right lower lobe. There is ground-glass infiltrate noted peripherally within the right lower lobe, best noted at the costophrenic angle. The findings are most suggestive of aspiration, less likely acute infection. No pneumothorax or pleural effusion. Mild parenchymal scarring at the left lung base. Musculoskeletal: No acute bone abnormality. No lytic or blastic bone lesion. CT ABDOMEN PELVIS FINDINGS Hepatobiliary: Multiple calculi are seen within a contracted gallbladder lumen. No pericholecystic inflammatory change. Pneumobilia is present, possibly related prior sphincterotomy. There is choledocholithiasis present with a 9 mm calculus seen within the distal common duct, best noted on coronal image # 56. The extrahepatic bile duct is mildly dilated. The liver is otherwise unremarkable. Pancreas: Unremarkable Spleen: Unremarkable Adrenals/Urinary Tract: The adrenal glands are unremarkable. 2 mm nonobstructing calculus is seen within the lower pole of the right kidney. Simple cortical cyst  noted within the interpolar region of the left kidney. The kidneys are otherwise unremarkable. The bladder is decompressed and is largely obscured by streak artifact from bilateral hip prostheses. Stomach/Bowel: Moderate descending and sigmoid colonic diverticulosis noted. The stomach, small bowel, and large bowel are otherwise unremarkable. Appendix absent. No free intraperitoneal gas or fluid. Vascular/Lymphatic: There is extensive aortoiliac atherosclerotic calcification. No aneurysm. No pathologic adenopathy within the abdomen and pelvis. Reproductive: Dense calcification within the uterine corpus likely relates to an involuted uterine fibroid. The pelvic organs are otherwise unremarkable. Other: No abdominal wall hernia.  Rectum unremarkable. Musculoskeletal: Bilateral femoral neck pinning has been performed. No acute bone abnormality. No lytic or blastic bone lesions. IMPRESSION: Choledocholithiasis. Mild pneumobilia is present, suggesting changes of prior sphincterotomy. Cholelithiasis. Debris within the right mainstem bronchus, airway impaction within the right lower lobe, and superimposed dependent infiltrate all in keeping with probable changes of aspiration. Minimal right nonobstructing nephrolithiasis. Moderate distal colonic diverticulosis. Aortic Atherosclerosis (ICD10-I70.0). Electronically Signed   By: Helyn Numbers MD   On: 10/18/2020 04:08   CT Head Wo Contrast  Result Date: 10/18/2020 CLINICAL DATA:  Mental status change EXAM: CT HEAD WITHOUT CONTRAST TECHNIQUE: Contiguous axial images were obtained from the base of the skull through the vertex without intravenous contrast. COMPARISON:  May 01, 2020 FINDINGS: Brain: No evidence of acute territorial infarction, hemorrhage, hydrocephalus,extra-axial collection or mass lesion/mass effect. There is dilatation the ventricles and sulci consistent with age-related atrophy. Low-attenuation changes in the deep white matter consistent with small  vessel ischemia. Lacunar infarcts are again noted within the bilateral basal ganglia. Vascular: No hyperdense vessel or unexpected calcification. Vertebral artery calcifications are seen. Skull: The skull is intact. No fracture or focal lesion identified. Sinuses/Orbits: The visualized paranasal sinuses and mastoid air cells are clear. The orbits and globes intact. Other: None IMPRESSION: No acute intracranial abnormality. Findings consistent with age related atrophy and chronic small vessel ischemia Electronically Signed   By: Jonna Clark M.D.   On: 10/18/2020 03:52   CT Chest Wo Contrast  Result Date: 10/18/2020 CLINICAL DATA:  Sepsis, lactic acidosis EXAM: CT CHEST, ABDOMEN AND PELVIS WITHOUT CONTRAST TECHNIQUE: Multidetector CT imaging of  the chest, abdomen and pelvis was performed following the standard protocol without IV contrast. COMPARISON:  None. FINDINGS: CT CHEST FINDINGS Cardiovascular: Extensive multi-vessel coronary artery calcification. Cardiac size within normal limits. No pericardial effusion. Central pulmonary arteries are enlarged in keeping with changes of pulmonary arterial hypertension. Extensive calcified atherosclerotic plaque and arteriosclerosis is seen involving the thoracic aorta. No aortic aneurysm. Mediastinum/Nodes: No pathologic thoracic adenopathy. Esophagus unremarkable. Thyroid unremarkable. Lungs/Pleura: There is debris within the a right mainstem bronchus and peripheral airway impaction within the right lower lobe. There is ground-glass infiltrate noted peripherally within the right lower lobe, best noted at the costophrenic angle. The findings are most suggestive of aspiration, less likely acute infection. No pneumothorax or pleural effusion. Mild parenchymal scarring at the left lung base. Musculoskeletal: No acute bone abnormality. No lytic or blastic bone lesion. CT ABDOMEN PELVIS FINDINGS Hepatobiliary: Multiple calculi are seen within a contracted gallbladder lumen.  No pericholecystic inflammatory change. Pneumobilia is present, possibly related prior sphincterotomy. There is choledocholithiasis present with a 9 mm calculus seen within the distal common duct, best noted on coronal image # 56. The extrahepatic bile duct is mildly dilated. The liver is otherwise unremarkable. Pancreas: Unremarkable Spleen: Unremarkable Adrenals/Urinary Tract: The adrenal glands are unremarkable. 2 mm nonobstructing calculus is seen within the lower pole of the right kidney. Simple cortical cyst noted within the interpolar region of the left kidney. The kidneys are otherwise unremarkable. The bladder is decompressed and is largely obscured by streak artifact from bilateral hip prostheses. Stomach/Bowel: Moderate descending and sigmoid colonic diverticulosis noted. The stomach, small bowel, and large bowel are otherwise unremarkable. Appendix absent. No free intraperitoneal gas or fluid. Vascular/Lymphatic: There is extensive aortoiliac atherosclerotic calcification. No aneurysm. No pathologic adenopathy within the abdomen and pelvis. Reproductive: Dense calcification within the uterine corpus likely relates to an involuted uterine fibroid. The pelvic organs are otherwise unremarkable. Other: No abdominal wall hernia.  Rectum unremarkable. Musculoskeletal: Bilateral femoral neck pinning has been performed. No acute bone abnormality. No lytic or blastic bone lesions. IMPRESSION: Choledocholithiasis. Mild pneumobilia is present, suggesting changes of prior sphincterotomy. Cholelithiasis. Debris within the right mainstem bronchus, airway impaction within the right lower lobe, and superimposed dependent infiltrate all in keeping with probable changes of aspiration. Minimal right nonobstructing nephrolithiasis. Moderate distal colonic diverticulosis. Aortic Atherosclerosis (ICD10-I70.0). Electronically Signed   By: Helyn Numbers MD   On: 10/18/2020 04:08   DG Chest Portable 1 View  Result Date:  10/18/2020 CLINICAL DATA:  Altered mental status, hypotension EXAM: PORTABLE CHEST 1 VIEW COMPARISON:  None. FINDINGS: The lungs are symmetrically well inflated. Mild bibasilar atelectasis or infiltrate, left greater than right. No pneumothorax or pleural effusion. Cardiac size within normal limits. Atherosclerotic calcifications seen within the aortic lumen. Pulmonary vascularity is normal. No acute bone abnormality. IMPRESSION: Bibasilar atelectasis or infiltrate. Electronically Signed   By: Helyn Numbers MD   On: 10/18/2020 02:51    ____________________________________________   PROCEDURES  Procedure(s) performed (including Critical Care):  Procedures  CRITICAL CARE Performed by: Rochele Raring   Total critical care time: 65 minutes  Critical care time was exclusive of separately billable procedures and treating other patients.  Critical care was necessary to treat or prevent imminent or life-threatening deterioration.  Critical care was time spent personally by me on the following activities: development of treatment plan with patient and/or surrogate as well as nursing, discussions with consultants, evaluation of patient's response to treatment, examination of patient, obtaining history from patient or surrogate, ordering and  performing treatments and interventions, ordering and review of laboratory studies, ordering and review of radiographic studies, pulse oximetry and re-evaluation of patient's condition.  ____________________________________________   INITIAL IMPRESSION / ASSESSMENT AND PLAN / ED COURSE  As part of my medical decision making, I reviewed the following data within the electronic MEDICAL RECORD NUMBER History obtained from family, Nursing notes reviewed and incorporated, Labs reviewed , EKG interpreted NSR, Old EKG reviewed, Old chart reviewed, Radiograph reviewed , A consult was requested and obtained from this/these consultant(s) Critical care and Notes from prior ED  visits         Patient here with hypotension, altered mental status.  Rectal temp here of 97.7.  Differential includes septic shock, cardiogenic shock, hypovolemia due to dehydration or anemia, CVA, intracranial hemorrhage.  Will obtain labs, urine, cultures, CT of her head.  Unclear when she was last seen well.  We will continue IV fluids.  Family currently not at bedside.  ED PROGRESS  Patient's blood glucose here is 51.  Will give D50.  We will continue to closely monitor.  Patient's lactate has come back elevated at 5.4.  We will continue to give 30 mL/kg IV fluid bolus and broad-spectrum antibiotics for concerns for sepsis.  No source identified at this time.  Patient's hemoglobin is 7.4.  She is guaiac negative with normal brown appearing stool without gross blood or melena.  She also has acute renal failure with creatinine of 2.37.  Was last normal in September 2021.  She has a bicarb of 12 which is likely secondary to lactic acidosis as well as uremia.  Her ammonia level today is normal.  Her troponin is normal.  Urine shows many bacteria but also squamous cells.  No other sign of urinary tract infection.  Chest x-ray concerning for atelectasis versus infiltrate bibasilarly.  Blood pressures improving but still soft with IV fluids.  Repeat lactic pending.  CT head shows no acute intracranial abnormality.  Her daughter-in-law at bedside does report that Friday she had a witnessed fall out of bed by her roommate.  She is on aspirin but no anticoagulation.  Daughter-in-law reports that the patient was able to state the daughter-in-law's name Angie at the bedside.  Otherwise we have not heard her talk here.  We did discuss that I am concerned that aphasia could be due to a stroke but she would not be a TPA candidate given we do not know the last known well.  CT of her chest shows signs of aspiration pneumonia on the right.  She has received vancomycin, cefepime as well as Flagyl.  CT of her  abdomen pelvis incidentally shows cholelithiasis and signs of choledocholithiasis however her LFTs are normal.  We will add on a lipase.  Her abdominal exam seems benign.  She will likely need further imaging of this once she is more stable.  4:30 AM  Spoke with ICU NP who agrees with admission.  Patient has received 3 L of IV fluids in the ED and 500 mL with EMS and continues to have soft pressures.  We will start her on peripheral Levophed.  4:40 AM  Spoke with Dr. Allegra LaiVanga with gastroenterology given her abdominal CT findings.  They will see patient in consultation today.  Agree that patient would be too unstable for ERCP at this time and would likely need MRCP when more stable.  Updated patient's daughter-in-law Angie by phone.  At this time they report patient is a FULL CODE.  I reviewed all  nursing notes and pertinent previous records as available.  I have reviewed and interpreted any EKGs, lab and urine results, imaging (as available).  ____________________________________________   FINAL CLINICAL IMPRESSION(S) / ED DIAGNOSES  Final diagnoses:  Altered mental status, unspecified altered mental status type  Hypoglycemia  Aspiration pneumonia of right lung, unspecified aspiration pneumonia type, unspecified part of lung (HCC)  Septic shock (HCC)  Acute renal failure, unspecified acute renal failure type [facility])  Choledocholithiasis     ED Discharge Orders    None      *Please note:  Kim Roy was evaluated in Emergency Department on 10/18/2020 for the symptoms described in the history of present illness. She was evaluated in the context of the global COVID-19 pandemic, which necessitated consideration that the patient might be at risk for infection with the SARS-CoV-2 virus that causes COVID-19. Institutional protocols and algorithms that pertain to the evaluation of patients at risk for COVID-19 are in a state of rapid change based on information released by regulatory bodies  including the CDC and federal and state organizations. These policies and algorithms were followed during the patient's care in the ED.  Some ED evaluations and interventions may be delayed as a result of limited staffing during and the pandemic.*   Note:  This document was prepared using Dragon voice recognition software and may include unintentional dictation errors.       Ward, Layla Maw, DO 10/18/20 1000

## 2020-10-18 NOTE — Progress Notes (Signed)
Offered pt 33ml water via spoon, pt had some difficulty pursing her lips around the spoon to take the water into her mouth. Offered straw, unable to draw water up the straw. Notified Dr. Belia Heman. Speech consult ordered for swallow evaluation.

## 2020-10-18 NOTE — ED Notes (Signed)
Patient back to room from CT and placed on monitor

## 2020-10-18 NOTE — ED Notes (Signed)
Critical lactic acid of 5.4 called from lab. Dr. Elesa Massed notified in person, no new verbal orders received.

## 2020-10-18 NOTE — Plan of Care (Addendum)
Patient is drowsy but responds to voice, follows commands in all 4 extremities. Alert to self and situation, disoriented to place and time. RRR WNL, WOB unlabored on RA. NSR on telemetry, on levophed for MAP goal >60. Peripheral pulses palpable. BS active x 4, abdomen soft and nontender. No urine output since admission, less than 28ml on bladder scan per nightshift. Foam in place on sacrum, skin assessed beneath, no breakdown noted.  Problem: Education: Goal: Knowledge of General Education information will improve Description: Including pain rating scale, medication(s)/side effects and non-pharmacologic comfort measures Outcome: Not Progressing   Problem: Health Behavior/Discharge Planning: Goal: Ability to manage health-related needs will improve Outcome: Not Progressing   Problem: Clinical Measurements: Goal: Ability to maintain clinical measurements within normal limits will improve Outcome: Not Progressing Goal: Will remain free from infection Outcome: Not Progressing Goal: Diagnostic test results will improve Outcome: Not Progressing Goal: Respiratory complications will improve Outcome: Not Progressing Goal: Cardiovascular complication will be avoided Outcome: Not Progressing   Problem: Activity: Goal: Risk for activity intolerance will decrease Outcome: Not Progressing   Problem: Nutrition: Goal: Adequate nutrition will be maintained Outcome: Not Progressing   Problem: Coping: Goal: Level of anxiety will decrease Outcome: Not Progressing   Problem: Elimination: Goal: Will not experience complications related to bowel motility Outcome: Not Progressing Goal: Will not experience complications related to urinary retention Outcome: Not Progressing   Problem: Pain Managment: Goal: General experience of comfort will improve Outcome: Not Progressing   Problem: Safety: Goal: Ability to remain free from injury will improve Outcome: Not Progressing   Problem: Skin  Integrity: Goal: Risk for impaired skin integrity will decrease Outcome: Not Progressing   Problem: Fluid Volume: Goal: Hemodynamic stability will improve Outcome: Not Progressing   Problem: Clinical Measurements: Goal: Diagnostic test results will improve Outcome: Not Progressing Goal: Signs and symptoms of infection will decrease Outcome: Not Progressing

## 2020-10-18 NOTE — H&P (Addendum)
NAME:  Kim Roy, MRN:  [id number], DOB:  May 09, 1931, LOS: 0 ADMISSION DATE:  10/18/2020, CONSULTATION DATE: 10/18/2020 REFERRING MD: Dr. Elesa Massed, CHIEF COMPLAINT: Hypotension and altered mental status  Brief History:  87 year old female admitted with aspiration pneumonia & choledocholithiasis in septic shock requiring vasopressors admitted to ICU.  History of Present Illness:  87 year old female arrived at the ED via EMS from [facility] due to complaints of hypotension and altered mental status.  SNF staff reported to EMS that the patient was found less responsive than normal, BP 60/30 was obtained in the field and improved with IV fluids. It is unclear when the patient's LKW time was, SNF staff were unsure. ED course: Initial vitals- 97.7/ 18/ 73/ 98/44 (61)/SPO2 95% on room air.  Labs significant for: Metabolic acidosis with a serum bicarb of 12, hypokalemia: 3.1, Cl: 117, AKI with BUN/Cr: 54/2.37 (elevated from 12/0.50), Ca-6.7, lactic acidosis: 5.4 > 6.9, PCT 0.10. Patient received septic work-up and protocol including 3.5 L of LR, and empiric antibiotics. Due to hypotension post IV fluid resuscitation PCCM consulted for admission to ICU and vasopressor administration.  Past Medical History:  TIA Hyperlipidemia Hypertension Diabetes mellitus without complication CKD stage III Bilateral carotid artery disease Gout Significant Hospital Events:  10/18/2020-admit to ICU with sepsis requiring vasopressors  Consults:  Gastroenterology  Procedures:    Significant Diagnostic Tests:  10/18/2020 CT head Wo contrast >> no acute intracranial abnormality, findings consistent with age-related atrophy and chronic small vessel ischemia  10/18/2020 CT chest/abdomen/pelvis >> choledocholithiasis, mild pneumobilia is present suggesting changes of prior sphincterotomy, cholelithiasis, debris within the right mainstem bronchus airway impaction with in the right lower lobe and superimposed  dependent infiltrate all in keeping with changes due to aspiration.  Minimal right nonobstructing nephrolithiasis, moderate distal colonic diverticulosis, aortic atherosclerosis  Micro Data:  10/18/2020 urine culture >> 10/18/2020 blood culture x 2 >> 10/18/2020 COVID-19 >> negative 10/18/2020 influenza A/B >> negative  Antimicrobials:  10/18/2020 cefepime >> 10/18/2020 metronidazole >> 10/18/2020 vancomycin x 1  Interim History / Subjective:  Patient is soft-spoken, but responsive, appears frail-but able to follow commands. No complaints of pain/nausea at this time. Unable however to provide story of precipitating events to this episode.  Labs/ Imaging personally reviewed Na+/ K+: 142/ 3.1 BUN/Cr.: 54/ 2.37 Hgb: 7.4  WBC/ TMAX: 5.8 Lactic/ PCT: 5.4 > 6.9 /unremarkable at 0.10  CXR 10/18/20: Bibasilar atelectasis or infiltrate Objective   Blood pressure (!) 96/46, pulse 73, temperature 97.7 F (36.5 C), temperature source Rectal, resp. rate 15, height 5\' 5"  (1.651 m), weight 56.7 kg, SpO2 95 %.        Intake/Output Summary (Last 24 hours) at 10/18/2020 0440 Last data filed at 10/18/2020 0358 Gross per 24 hour  Intake 2100 ml  Output --  Net 2100 ml   Filed Weights   10/18/20 0138  Weight: 56.7 kg    Examination: General: Adult female, critically ill, lying in bed, frail-appearing NAD HEENT: MM pink/moist, anicteric, atraumatic, neck supple Neuro: *A&O x 2-3, able to follow commands, PERRL +3, MAE CV: s1s2 RRR, NSR on monitor, no r/m/g Pulm: Regular, non labored on room air, breath sounds diminished throughout GI: soft, rounded, non tender, bs x 4 GU: Pure wick placed, no UOP as of yet Skin: Limited exam -no rashes/lesions noted Extremities: warm/dry, pulses + 2 R/P, no edema noted  Resolved Hospital Problem list     Assessment & Plan:  Sepsis with septic shock Due to suspected aspiration pneumonia & intra-abdominal  infection CT chest/abdomen/pelvis: Consistent with  aspiration pneumonia, choledocholithiasis/cholelithiasis/nonobstructing right nephrolithiasis (no pericholecystic inflammatory change, calculi are non obstructing, however-9 mm calculus seen within the distal common duct & extrahepatic bile duct is mildly dilated) Lactic: 5.4> 6.9 Baseline PCT: 0.10, Lactic is trending up however patient has already received 3.5 L will continue IVF @ 150 mL/h- repeat lactic @ 12:00.  Initial interventions/workup included: 3.5 L of LR & Cefepime/ Vancomycin/ Metronidazole - Supplemental oxygen as needed, to maintain SpO2 > 90% - f/u cultures, trend lactic/ PCT - Daily CBC - monitor WBC/ fever curve - IV antibiotics: cefepime & Flagyl - IVF hydration as needed: LR at 150 an hour - Consider vasopressors to maintain MAP< 65, norepinephrine 1st line - Strict I/O's: alert provider if UOP < 0.5 mL/kg/hr - Persistent hypotension consider stress dose steroids (hydrocortisone 50q6 or 100q8) -GI consulted appreciate input  Acute Kidney Injury  Metabolic acidosis In the setting of hypotension due to suspected sepsis Baseline creatinine 0.50, creatinine on admission 2.37 - Strict I/O's: alert provider if UOP < 0.5 mL/kg/hr - IVF hydration: 150 mL/h - Daily BMP, replace electrolytes PRN - Avoid nephrotoxic agents as able, ensure adequate renal perfusion - Consult nephrology if iHD or CRRT indicated   Bilateral carotid artery disease Hyperlipidemia PMHx: TIA - continue home aspirin  -Continue atorvastatin  Hypertension Sepsis associated hypotension -Home regimen on hold: Carvedilol, lisinopril-hydrochlorothiazide.  Consider restarting as patient stabilizes  Best practice (evaluated daily)  Diet: N.p.o. Pain/Anxiety/Delirium protocol (if indicated): N/A VAP protocol (if indicated): N/A DVT prophylaxis: Heparin SQ GI prophylaxis: Protonix IV Glucose control: Monitor every 4, SSI coverage if needed Mobility: Bedrest, mobilize as tolerated Disposition:  ICU  Goals of Care:  Last date of multidisciplinary goals of care discussion: 10/18/2020 Family and staff present: Dr. Elesa Massed spoke with the patient's POA Summary of discussion: Relayed plan of care Follow up goals of care discussion due: 10/19/2020 Code Status: Full  Labs   CBC: Recent Labs  Lab 10/18/20 0150  WBC 5.8  NEUTROABS 3.6  HGB 7.4*  HCT 24.5*  MCV 94.6  PLT 133*    Basic Metabolic Panel: Recent Labs  Lab 10/18/20 0150  NA 142  K 3.1*  CL 117*  CO2 12*  GLUCOSE 66*  BUN 54*  CREATININE 2.37*  CALCIUM 6.7*   GFR: Estimated Creatinine Clearance: 15 mL/min (A) (by C-G formula based on SCr of 2.37 mg/dL (H)). Recent Labs  Lab 10/18/20 0150  WBC 5.8  LATICACIDVEN 5.4*    Liver Function Tests: Recent Labs  Lab 10/18/20 0150  AST 18  ALT 7  ALKPHOS 31*  BILITOT 1.0  PROT 4.1*  ALBUMIN 2.0*   No results for input(s): LIPASE, AMYLASE in the last 168 hours. Recent Labs  Lab 10/18/20 0150  AMMONIA 26    ABG No results found for: PHART, PCO2ART, PO2ART, HCO3, TCO2, ACIDBASEDEF, O2SAT   Coagulation Profile: No results for input(s): INR, PROTIME in the last 168 hours.  Cardiac Enzymes: No results for input(s): CKTOTAL, CKMB, CKMBINDEX, TROPONINI in the last 168 hours.  HbA1C: Hgb A1c MFr Bld  Date/Time Value Ref Range Status  05/01/2020 10:38 AM 6.1 (H) 4.8 - 5.6 % Final    Comment:    (NOTE) Pre diabetes:          5.7%-6.4%  Diabetes:              >6.4%  Glycemic control for   <7.0% adults with diabetes     CBG: Recent Labs  Lab 10/18/20 0158 10/18/20 0217 10/18/20 0232 10/18/20 0305  GLUCAP 51* 76 158* 188*    Review of Systems:  UTA-patient provides minimal verbal responses, unable to get full idea of precipitating events. No complaints of pain/nausea/shortness of breath at this time  Past Medical History:  She,  has a past medical history of Anemia in CKD (chronic kidney disease), Bronchitis, Coronary artery disease,  Diabetes mellitus without complication (HCC), Gout, HTN (hypertension), Hyperlipemia, Metabolic encephalopathy, Thrombocytopenia (HCC), TIA (transient ischemic attack), and Vitamin D deficiency.   Surgical History:   Past Surgical History:  Procedure Laterality Date  . FRACTURE SURGERY    . HIP PINNING,CANNULATED Left 01/20/2019   Procedure: CANNULATED HIP PINNING;  Surgeon: Kennedy BuckerMenz, Michael, MD;  Location: [facility] ORS;  Service: Orthopedics;  Laterality: Left;     Social History:   reports that she has never smoked. Her smokeless tobacco use includes snuff. She reports that she does not drink alcohol and does not use drugs.   Family History:  Her family history is not on file.   Allergies No Known Allergies   Home Medications  Prior to Admission medications   Medication Sig Start Date End Date Taking? Authorizing Provider  acetaminophen (TYLENOL) 325 MG tablet Take 2 tablets (650 mg total) by mouth every 6 (six) hours as needed for mild pain (or Fever >/= 101). 01/23/19   Auburn BilberryPatel, Shreyang, MD  amLODipine (NORVASC) 10 MG tablet Take 10 mg by mouth daily.    [provider]  aspirin EC 325 MG EC tablet Take 1 tablet (325 mg total) by mouth daily with breakfast. 01/23/19   Evon SlackGaines, Thomas C, PA-C  atorvastatin (LIPITOR) 40 MG tablet Take 40 mg by mouth daily. 01/12/19   [provider]  carvedilol (COREG) 12.5 MG tablet Take 12.5 mg by mouth 2 (two) times a day. 04/20/15   [provider]  cetirizine (ZYRTEC) 10 MG tablet Take 10 mg by mouth daily. Patient not taking: Reported on 05/01/2020    [provider]  Cholecalciferol 25 MCG (1000 UT) tablet Take 1,000 Units by mouth daily.    [provider]  docusate sodium (COLACE) 100 MG capsule Take 1 capsule (100 mg total) by mouth 2 (two) times daily. Patient not taking: Reported on 05/01/2020 01/23/19   Auburn BilberryPatel, Shreyang, MD  feeding supplement, ENSURE ENLIVE, (ENSURE ENLIVE) LIQD Take 237 mLs by mouth daily.  05/04/20   Marguerita MerlesSheikh, Omair Latif, DO  iron polysaccharides (NIFEREX) 150 MG capsule Take 1 capsule (150 mg total) by mouth daily. 05/04/20   Marguerita MerlesSheikh, Omair Latif, DO  lisinopril-hydrochlorothiazide (ZESTORETIC) 10-12.5 MG tablet Take 1 tablet by mouth daily. 05/04/20 05/04/21  Marguerita MerlesSheikh, Omair Latif, DO  metFORMIN (GLUCOPHAGE) 1000 MG tablet Take 1,000 mg by mouth daily. 04/07/15   [provider]  ondansetron (ZOFRAN) 4 MG tablet Take 1 tablet (4 mg total) by mouth every 6 (six) hours as needed for nausea. 05/04/20   Merlene LaughterSheikh, Omair Latif, DO     Critical care time: 45 minutes       Betsey HolidayBritton Lee Rust-Chester, AGACNP-BC Acute Care Nurse Practitioner [facility]   [phone] / [phone] Please see Amion for pager details.         PCCM ATTENDING ATTESTATION:  I have evaluated patient with the APP, I personally  reviewed database in its entirety and discussed care plan in detail. In addition, this patient was discussed on multidisciplinary rounds.   I agree with assessment and plan.  SEPTIC SHOCK ON  ADMISSION ON PRESSORS WiTH RENAL FAILURE INFECTION OF GB LIKELY SOURCE  Septic shock -use vasopressors to keep MAP>65 -follow ABG and LA -follow up cultures -emperic ABX -consider stress dose steroids -aggressive IV fluid Resuscitation  ELECTROLYTES -follow labs as needed -replace as needed -pharmacy consultation and following  ACUTE KIDNEY INJURY/Renal Failure -continue Foley Catheter-assess need -Avoid nephrotoxic agents -Follow urine output, BMP -Ensure adequate renal perfusion, optimize oxygenation -Renal dose medications   Critical Care Time devoted to patient care services described in this note is 55 minutes.   Overall, patient is critically ill, prognosis is guarded.  Patient with Multiorgan failure and at high risk for cardiac arrest and death.    Lucie Leather, M.D.  [facility]  Medical Director  [facility] Medical Director [facility] Cardio-Pulmonary Department

## 2020-10-18 NOTE — Consult Note (Signed)
Kim Darby, MD [address]  [place]  [place], [place] [address]  Main: [phone]  Fax: [phone] Pager: [phone]   Consultation  Referring Provider:     No ref. provider found Primary Care Physician:  [facility]: Althia Forts         Reason for Consultation:     Choledocholithiasis  Date of Admission:  10/18/2020 Date of Consultation:  10/18/2020         HPI:   Kim Roy is a 87 y.o. female with history of past medical history as listed below is admitted to ICU secondary to septic shock and altered mental status from pneumonia.  Patient underwent imaging studies including abdomen and pelvis without contrast which incidentally revealed choledocholithiasis, 9 mm stone in the distal common bile duct, extrahepatic duct mildly dilated, pneumobilia possibly related to prior sphincterotomy, cholelithiasis.  Patient's LFTs yesterday and today are within normal limits.  Bilirubin is normal as well as alkaline phosphatase.  When interviewed the patient in the ICU, she was just off vasopressors.  Per nurse, she is altered and did not cooperate to eat anything by mouth.  She also has AKI, had normal renal function and 9/21.  She has lactic acidosis Patient denies abdominal pain, she was able to answer simple questions  NSAIDs: None  Antiplts/Anticoagulants/Anti thrombotics: None  GI Procedures: Unknown  Past Medical History:  Diagnosis Date  . Anemia in CKD (chronic kidney disease)   . Bronchitis   . Coronary artery disease   . Diabetes mellitus without complication ([facility])   . Gout   . HTN (hypertension)   . Hyperlipemia   . Metabolic encephalopathy   . Thrombocytopenia ([facility])   . TIA (transient ischemic attack)   . Vitamin D deficiency     Past Surgical History:  Procedure Laterality Date  . FRACTURE SURGERY    . HIP PINNING,CANNULATED Left 01/20/2019   Procedure: CANNULATED HIP PINNING;   Surgeon: Hessie Knows, MD;  Location: [facility] ORS;  Service: Orthopedics;  Laterality: Left;    Prior to Admission medications   Medication Sig Start Date End Date Taking? Authorizing Provider  acetaminophen (TYLENOL) 325 MG tablet Take 2 tablets (650 mg total) by mouth every 6 (six) hours as needed for mild pain (or Fever >/= 101). 01/23/19  Yes Dustin Flock, MD  aspirin EC 81 MG tablet Take 81 mg by mouth daily. Swallow whole.   Yes [provider]  atorvastatin (LIPITOR) 40 MG tablet Take 40 mg by mouth daily. 01/12/19  Yes [provider]  carvedilol (COREG) 12.5 MG tablet Take 12.5 mg by mouth 2 (two) times a day. 04/20/15  Yes [provider]  Cholecalciferol 25 MCG (1000 UT) tablet Take 1,000 Units by mouth daily.   Yes [provider]  iron polysaccharides (NIFEREX) 150 MG capsule Take 1 capsule (150 mg total) by mouth daily. 05/04/20  Yes Sheikh, Omair Latif, DO  lisinopril-hydrochlorothiazide (ZESTORETIC) 10-12.5 MG tablet Take 1 tablet by mouth daily. 05/04/20 05/04/21 Yes Sheikh, Omair Latif, DO  metFORMIN (GLUCOPHAGE) 500 MG tablet Take 500 mg by mouth 2 (two) times daily with a meal. 04/07/15  Yes [provider]  mirtazapine (REMERON) 7.5 MG tablet Take 7.5 mg by mouth at bedtime. 10/14/20  Yes [provider]  pantoprazole (PROTONIX) 40 MG tablet Take 40 mg by mouth 2 (two) times daily. 09/23/20  Yes [provider]  senna (SENOKOT) 8.6 MG tablet Take 2  tablets by mouth 2 (two) times daily.   Yes [provider]  amLODipine (NORVASC) 10 MG tablet Take 10 mg by mouth daily. Patient not taking: Reported on 10/18/2020    [provider]  aspirin EC 325 MG EC tablet Take 1 tablet (325 mg total) by mouth daily with breakfast. Patient not taking: Reported on 10/18/2020 01/23/19   Duanne Guess, PA-C  cetirizine (ZYRTEC) 10 MG tablet Take 10 mg by mouth daily. Patient not taking: No sig reported    [provider]  docusate sodium (COLACE) 100 MG capsule Take 1 capsule (100 mg total) by mouth 2 (two) times daily. Patient not taking: Reported on 05/01/2020 01/23/19   Dustin Flock, MD  feeding supplement, ENSURE ENLIVE, (ENSURE ENLIVE) LIQD Take 237 mLs by mouth daily. 05/04/20   Sheikh, Omair Latif, DO  ondansetron (ZOFRAN) 4 MG tablet Take 1 tablet (4 mg total) by mouth every 6 (six) hours as needed for nausea. Patient not taking: No sig reported 05/04/20   Raiford Noble Latif, DO    Current Facility-Administered Medications:  .  aspirin chewable tablet 81 mg, 81 mg, Oral, Daily, Rust-Chester, Britton L, NP .  atorvastatin (LIPITOR) tablet 40 mg, 40 mg, Oral, Daily, Rust-Chester, Britton L, NP .  ceFEPIme (MAXIPIME) 2 g in sodium chloride 0.9 % 100 mL IVPB, 2 g, Intravenous, Q24H, Flora Lipps, MD, Stopped at 10/18/20 1003 .  Chlorhexidine Gluconate Cloth 2 % PADS 6 each, 6 each, Topical, Daily, Flora Lipps, MD, 6 each at 10/18/20 1558 .  docusate sodium (COLACE) capsule 100 mg, 100 mg, Oral, BID PRN, Rust-Chester, Britton L, NP .  heparin injection 5,000 Units, 5,000 Units, Subcutaneous, Q8H, Rust-Chester, Britton L, NP, 5,000 Units at 10/18/20 0936 .  hydrocortisone sodium succinate (SOLU-CORTEF) 100 MG injection 50 mg, 50 mg, Intravenous, Q6H, Kasa, Kurian, MD .  lactated ringers infusion, , Intravenous, Continuous, Ward, Delice Bison, DO, Stopped at 10/18/20 1325 .  metroNIDAZOLE (FLAGYL) IVPB 500 mg, 500 mg, Intravenous, Q8H, Kasa, Kurian, MD, Last Rate: 100 mL/hr at 10/18/20 1400, Infusion Verify at 10/18/20 1400 .  norepinephrine (LEVOPHED) 49m in 2564mpremix infusion, 0-10 mcg/min, Intravenous, Continuous, Rust-Chester, BrHuel CoteNP, Stopped at 10/18/20 1356 .  pantoprazole (PROTONIX) injection 40 mg, 40 mg, Intravenous, Q24H, Rust-Chester, Britton L, NP, 40 mg at 10/18/20 0937 .  polyethylene glycol (MIRALAX / GLYCOLAX) packet 17 g, 17 g, Oral, Daily PRN, Rust-Chester, BrHuel Cote NP  History reviewed. No pertinent family history.   Social History   Tobacco Use  . Smoking status: Never Smoker  . Smokeless tobacco: Current User    Types: Snuff  Substance Use Topics  . Alcohol use: No  . Drug use: No    Allergies as of 10/18/2020  . (No Known Allergies)    Review of Systems:    All systems reviewed and negative except where noted in HPI.   Physical Exam:  Vital signs in last 24 hours: Temp:  [92.8 F (33.8 C)-97.88 F (36.6 C)] 97.88 F (36.6 C) (02/27 1530) Pulse Rate:  [69-90] 85 (02/27 1530) Resp:  [13-20] 16 (02/27 1530) BP: (84-126)/(41-72) 116/54 (02/27 1530) SpO2:  [95 %-100 %] 99 % (02/27 1530) Weight:  [56.7 kg] 56.7 kg (02/27 0138) Last BM Date: 10/18/20 General: Altered, thin built, NAD Head:  Normocephalic and atraumatic. Eyes:   No icterus.   Conjunctiva pale. PERRLA. Ears:  Normal auditory acuity. Neck:  Supple; no masses or thyroidomegaly Lungs: Respirations even and unlabored.  Decreased breath sounds in right side  Heart:  Regular rate and rhythm;  Without murmur, clicks, rubs or gallops Abdomen:  Soft, nondistended, nontender. Normal bowel sounds. No appreciable masses or hepatomegaly.  No rebound or guarding.  Rectal:  Not performed. Msk:  Symmetrical without gross deformities. Extremities:  Without edema, cyanosis or clubbing. Neurologic:  Alert and oriented x1;  Skin:  Intact without significant lesions or rashes.  LAB RESULTS: CBC Latest Ref Rng & Units 10/18/2020 05/04/2020 05/03/2020  WBC 4.0 - 10.5 K/uL 5.8 6.3 7.0  Hemoglobin 12.0 - 15.0 g/dL 7.4(L) 9.2(L) 9.8(L)  Hematocrit 36.0 - 46.0 % 24.5(L) 26.8(L) 30.3(L)  Platelets 150 - 400 K/uL 133(L) 184 179    BMET BMP Latest Ref Rng & Units 10/18/2020 05/04/2020 05/03/2020  Glucose 70 - 99 mg/dL 66(L) 119(H) 147(H)  BUN 8 - 23 mg/dL 54(H) 12 14  Creatinine 0.44 - 1.00 mg/dL 2.37(H) 0.50 0.65  Sodium 135 - 145 mmol/L 142 134(L) 134(L)  Potassium 3.5 - 5.1 mmol/L 3.1(L)  3.6 3.7  Chloride 98 - 111 mmol/L 117(H) 103 102  CO2 22 - 32 mmol/L 12(L) 22 23  Calcium 8.9 - 10.3 mg/dL 6.7(L) 8.2(L) 8.5(L)    LFT Hepatic Function Latest Ref Rng & Units 10/18/2020 05/04/2020 05/03/2020  Total Protein 6.5 - 8.1 g/dL 4.1(L) 5.8(L) 6.3(L)  Albumin 3.5 - 5.0 g/dL 2.0(L) 2.5(L) 2.7(L)  AST 15 - 41 U/L 18 13(L) 14(L)  ALT 0 - 44 U/L '7 12 12  ' Alk Phosphatase 38 - 126 U/L 31(L) 48 53  Total Bilirubin 0.3 - 1.2 mg/dL 1.0 0.6 0.7  Bilirubin, Direct 0.0 - 0.2 mg/dL - - -     STUDIES: CT ABDOMEN PELVIS WO CONTRAST  Result Date: 10/18/2020 CLINICAL DATA:  Sepsis, lactic acidosis EXAM: CT CHEST, ABDOMEN AND PELVIS WITHOUT CONTRAST TECHNIQUE: Multidetector CT imaging of the chest, abdomen and pelvis was performed following the standard protocol without IV contrast. COMPARISON:  None. FINDINGS: CT CHEST FINDINGS Cardiovascular: Extensive multi-vessel coronary artery calcification. Cardiac size within normal limits. No pericardial effusion. Central pulmonary arteries are enlarged in keeping with changes of pulmonary arterial hypertension. Extensive calcified atherosclerotic plaque and arteriosclerosis is seen involving the thoracic aorta. No aortic aneurysm. Mediastinum/Nodes: No pathologic thoracic adenopathy. Esophagus unremarkable. Thyroid unremarkable. Lungs/Pleura: There is debris within the a right mainstem bronchus and peripheral airway impaction within the right lower lobe. There is ground-glass infiltrate noted peripherally within the right lower lobe, best noted at the costophrenic angle. The findings are most suggestive of aspiration, less likely acute infection. No pneumothorax or pleural effusion. Mild parenchymal scarring at the left lung base. Musculoskeletal: No acute bone abnormality. No lytic or blastic bone lesion. CT ABDOMEN PELVIS FINDINGS Hepatobiliary: Multiple calculi are seen within a contracted gallbladder lumen. No pericholecystic inflammatory change. Pneumobilia is  present, possibly related prior sphincterotomy. There is choledocholithiasis present with a 9 mm calculus seen within the distal common duct, best noted on coronal image # 56. The extrahepatic bile duct is mildly dilated. The liver is otherwise unremarkable. Pancreas: Unremarkable Spleen: Unremarkable Adrenals/Urinary Tract: The adrenal glands are unremarkable. 2 mm nonobstructing calculus is seen within the lower pole of the right kidney. Simple cortical cyst noted within the interpolar region of the left kidney. The kidneys are otherwise unremarkable. The bladder is decompressed and is largely obscured by streak artifact from bilateral hip prostheses. Stomach/Bowel: Moderate descending and sigmoid colonic diverticulosis noted. The stomach, small bowel, and large bowel are otherwise unremarkable. Appendix absent. No  free intraperitoneal gas or fluid. Vascular/Lymphatic: There is extensive aortoiliac atherosclerotic calcification. No aneurysm. No pathologic adenopathy within the abdomen and pelvis. Reproductive: Dense calcification within the uterine corpus likely relates to an involuted uterine fibroid. The pelvic organs are otherwise unremarkable. Other: No abdominal wall hernia.  Rectum unremarkable. Musculoskeletal: Bilateral femoral neck pinning has been performed. No acute bone abnormality. No lytic or blastic bone lesions. IMPRESSION: Choledocholithiasis. Mild pneumobilia is present, suggesting changes of prior sphincterotomy. Cholelithiasis. Debris within the right mainstem bronchus, airway impaction within the right lower lobe, and superimposed dependent infiltrate all in keeping with probable changes of aspiration. Minimal right nonobstructing nephrolithiasis. Moderate distal colonic diverticulosis. Aortic Atherosclerosis (ICD10-I70.0). Electronically Signed   By: Fidela Salisbury MD   On: 10/18/2020 04:08   CT Head Wo Contrast  Result Date: 10/18/2020 CLINICAL DATA:  Mental status change EXAM: CT HEAD  WITHOUT CONTRAST TECHNIQUE: Contiguous axial images were obtained from the base of the skull through the vertex without intravenous contrast. COMPARISON:  May 01, 2020 FINDINGS: Brain: No evidence of acute territorial infarction, hemorrhage, hydrocephalus,extra-axial collection or mass lesion/mass effect. There is dilatation the ventricles and sulci consistent with age-related atrophy. Low-attenuation changes in the deep white matter consistent with small vessel ischemia. Lacunar infarcts are again noted within the bilateral basal ganglia. Vascular: No hyperdense vessel or unexpected calcification. Vertebral artery calcifications are seen. Skull: The skull is intact. No fracture or focal lesion identified. Sinuses/Orbits: The visualized paranasal sinuses and mastoid air cells are clear. The orbits and globes intact. Other: None IMPRESSION: No acute intracranial abnormality. Findings consistent with age related atrophy and chronic small vessel ischemia Electronically Signed   By: Prudencio Pair M.D.   On: 10/18/2020 03:52   CT Chest Wo Contrast  Result Date: 10/18/2020 CLINICAL DATA:  Sepsis, lactic acidosis EXAM: CT CHEST, ABDOMEN AND PELVIS WITHOUT CONTRAST TECHNIQUE: Multidetector CT imaging of the chest, abdomen and pelvis was performed following the standard protocol without IV contrast. COMPARISON:  None. FINDINGS: CT CHEST FINDINGS Cardiovascular: Extensive multi-vessel coronary artery calcification. Cardiac size within normal limits. No pericardial effusion. Central pulmonary arteries are enlarged in keeping with changes of pulmonary arterial hypertension. Extensive calcified atherosclerotic plaque and arteriosclerosis is seen involving the thoracic aorta. No aortic aneurysm. Mediastinum/Nodes: No pathologic thoracic adenopathy. Esophagus unremarkable. Thyroid unremarkable. Lungs/Pleura: There is debris within the a right mainstem bronchus and peripheral airway impaction within the right lower lobe.  There is ground-glass infiltrate noted peripherally within the right lower lobe, best noted at the costophrenic angle. The findings are most suggestive of aspiration, less likely acute infection. No pneumothorax or pleural effusion. Mild parenchymal scarring at the left lung base. Musculoskeletal: No acute bone abnormality. No lytic or blastic bone lesion. CT ABDOMEN PELVIS FINDINGS Hepatobiliary: Multiple calculi are seen within a contracted gallbladder lumen. No pericholecystic inflammatory change. Pneumobilia is present, possibly related prior sphincterotomy. There is choledocholithiasis present with a 9 mm calculus seen within the distal common duct, best noted on coronal image # 56. The extrahepatic bile duct is mildly dilated. The liver is otherwise unremarkable. Pancreas: Unremarkable Spleen: Unremarkable Adrenals/Urinary Tract: The adrenal glands are unremarkable. 2 mm nonobstructing calculus is seen within the lower pole of the right kidney. Simple cortical cyst noted within the interpolar region of the left kidney. The kidneys are otherwise unremarkable. The bladder is decompressed and is largely obscured by streak artifact from bilateral hip prostheses. Stomach/Bowel: Moderate descending and sigmoid colonic diverticulosis noted. The stomach, small bowel, and large bowel are otherwise  unremarkable. Appendix absent. No free intraperitoneal gas or fluid. Vascular/Lymphatic: There is extensive aortoiliac atherosclerotic calcification. No aneurysm. No pathologic adenopathy within the abdomen and pelvis. Reproductive: Dense calcification within the uterine corpus likely relates to an involuted uterine fibroid. The pelvic organs are otherwise unremarkable. Other: No abdominal wall hernia.  Rectum unremarkable. Musculoskeletal: Bilateral femoral neck pinning has been performed. No acute bone abnormality. No lytic or blastic bone lesions. IMPRESSION: Choledocholithiasis. Mild pneumobilia is present, suggesting  changes of prior sphincterotomy. Cholelithiasis. Debris within the right mainstem bronchus, airway impaction within the right lower lobe, and superimposed dependent infiltrate all in keeping with probable changes of aspiration. Minimal right nonobstructing nephrolithiasis. Moderate distal colonic diverticulosis. Aortic Atherosclerosis (ICD10-I70.0). Electronically Signed   By: Fidela Salisbury MD   On: 10/18/2020 04:08   DG Chest Portable 1 View  Result Date: 10/18/2020 CLINICAL DATA:  Altered mental status, hypotension EXAM: PORTABLE CHEST 1 VIEW COMPARISON:  None. FINDINGS: The lungs are symmetrically well inflated. Mild bibasilar atelectasis or infiltrate, left greater than right. No pneumothorax or pleural effusion. Cardiac size within normal limits. Atherosclerotic calcifications seen within the aortic lumen. Pulmonary vascularity is normal. No acute bone abnormality. IMPRESSION: Bibasilar atelectasis or infiltrate. Electronically Signed   By: Fidela Salisbury MD   On: 10/18/2020 02:51      Impression / Plan:   Kim Roy is a 87 y.o. female is admitted with altered mental status, septic shock secondary to pneumonia and AKI.  Patient is currently off pressors, oxygenating well on room air, being treated for pneumonia.  GI is consulted to evaluate for choledocholithiasis  Choledocholithiasis, cholelithiasis and history of biliary sphincterotomy due to presence of pneumobilia based on imaging Patient has normal LFTs x2 sets within last 24 hours No evidence of ascending cholangitis as we expect to see elevated LFTs as well as obstructive jaundice With history of biliary sphincterotomy, despite choledocholithiasis, patient probably has adequate biliary drainage and therefore LFTs are normal so far No evidence of gallstone pancreatitis Based on her laboratory data, there is no urgent indication for ERCP unless patient demonstrates obstructive jaundice or signs of ascending cholangitis ERCP  can be performed on an elective basis to clear CBD stones, after patient fully recovers from septic shock and prior to discharge Ideally, patient will need cholecystectomy to prevent gallstone pancreatitis and recurrent choledocholithiasis Monitor LFTs daily  Thank you for involving me in the care of this patient.  GI will sign off at this time, please call GI back with questions or concerns    LOS: 0 days   Sherri Sear, MD  10/18/2020, 4:25 PM   Note: This dictation was prepared with Dragon dictation along with smaller phrase technology. Any transcriptional errors that result from this process are unintentional.

## 2020-10-19 ENCOUNTER — Other Ambulatory Visit: Payer: Self-pay

## 2020-10-19 DIAGNOSIS — E162 Hypoglycemia, unspecified: Secondary | ICD-10-CM | POA: Diagnosis not present

## 2020-10-19 DIAGNOSIS — R4182 Altered mental status, unspecified: Secondary | ICD-10-CM

## 2020-10-19 DIAGNOSIS — J690 Pneumonitis due to inhalation of food and vomit: Secondary | ICD-10-CM | POA: Diagnosis not present

## 2020-10-19 DIAGNOSIS — D649 Anemia, unspecified: Secondary | ICD-10-CM

## 2020-10-19 DIAGNOSIS — K8050 Calculus of bile duct without cholangitis or cholecystitis without obstruction: Secondary | ICD-10-CM

## 2020-10-19 DIAGNOSIS — N179 Acute kidney failure, unspecified: Secondary | ICD-10-CM

## 2020-10-19 DIAGNOSIS — A419 Sepsis, unspecified organism: Secondary | ICD-10-CM | POA: Diagnosis not present

## 2020-10-19 LAB — BASIC METABOLIC PANEL
Anion gap: 10 (ref 5–15)
BUN: 60 mg/dL — ABNORMAL HIGH (ref 8–23)
CO2: 17 mmol/L — ABNORMAL LOW (ref 22–32)
Calcium: 8.2 mg/dL — ABNORMAL LOW (ref 8.9–10.3)
Chloride: 111 mmol/L (ref 98–111)
Creatinine, Ser: 3.31 mg/dL — ABNORMAL HIGH (ref 0.44–1.00)
GFR, Estimated: 13 mL/min — ABNORMAL LOW (ref >60)
Glucose, Bld: 117 mg/dL — ABNORMAL HIGH (ref 70–99)
Potassium: 4.6 mmol/L (ref 3.5–5.1)
Sodium: 138 mmol/L (ref 135–145)

## 2020-10-19 LAB — HEMOGLOBIN A1C
Hgb A1c MFr Bld: 6.1 % — ABNORMAL HIGH (ref 4.8–5.6)
Mean Plasma Glucose: 128.37 mg/dL

## 2020-10-19 LAB — MAGNESIUM
Magnesium: 1.2 mg/dL — ABNORMAL LOW (ref 1.7–2.4)
Magnesium: 2.5 mg/dL — ABNORMAL HIGH (ref 1.7–2.4)

## 2020-10-19 LAB — HEPATIC FUNCTION PANEL
ALT: 9 U/L (ref 0–44)
AST: 14 U/L — ABNORMAL LOW (ref 15–41)
Albumin: 2.3 g/dL — ABNORMAL LOW (ref 3.5–5.0)
Alkaline Phosphatase: 33 U/L — ABNORMAL LOW (ref 38–126)
Bilirubin, Direct: 0.1 mg/dL (ref 0.0–0.2)
Indirect Bilirubin: 0.7 mg/dL (ref 0.3–0.9)
Total Bilirubin: 0.8 mg/dL (ref 0.3–1.2)
Total Protein: 4.8 g/dL — ABNORMAL LOW (ref 6.5–8.1)

## 2020-10-19 LAB — GLUCOSE, CAPILLARY
Glucose-Capillary: 92 mg/dL (ref 70–99)
Glucose-Capillary: 95 mg/dL (ref 70–99)
Glucose-Capillary: 102 mg/dL — ABNORMAL HIGH (ref 70–99)
Glucose-Capillary: 122 mg/dL — ABNORMAL HIGH (ref 70–99)
Glucose-Capillary: 135 mg/dL — ABNORMAL HIGH (ref 70–99)
Glucose-Capillary: 117 mg/dL — ABNORMAL HIGH (ref 70–99)

## 2020-10-19 LAB — URINE CULTURE: Culture: NO GROWTH

## 2020-10-19 LAB — PROCALCITONIN: Procalcitonin: 0.65 ng/mL

## 2020-10-19 LAB — PHOSPHORUS: Phosphorus: 3.7 mg/dL (ref 2.5–4.6)

## 2020-10-19 LAB — LACTIC ACID, PLASMA: Lactic Acid, Venous: 1.5 mmol/L (ref 0.5–1.9)

## 2020-10-19 MED ORDER — LACTATED RINGERS IV SOLN: INTRAVENOUS | Status: AC

## 2020-10-19 MED ORDER — MAGNESIUM SULFATE 4 GM/100ML IV SOLN
4.0000 g | Freq: Once | INTRAVENOUS | Status: AC
  Administered 2020-10-19: 4 g via INTRAVENOUS
  Filled 2020-10-19: qty 100

## 2020-10-19 MED ORDER — POLYSACCHARIDE IRON COMPLEX 150 MG PO CAPS
150.0000 mg | ORAL_CAPSULE | Freq: Every day | ORAL | Status: DC
  Administered 2020-10-28: 150 mg via ORAL
  Filled 2020-10-19 (×10): qty 1

## 2020-10-19 MED ORDER — HYDROCORTISONE NA SUCCINATE PF 100 MG IJ SOLR
50.0000 mg | Freq: Every day | INTRAMUSCULAR | Status: DC
  Administered 2020-10-20 – 2020-10-21 (×2): 50 mg via INTRAVENOUS
  Filled 2020-10-19: qty 1
  Filled 2020-10-19: qty 2

## 2020-10-19 MED ORDER — SODIUM CHLORIDE 0.9 % IV SOLN
3.0000 g | INTRAVENOUS | Status: DC
  Administered 2020-10-19 – 2020-10-20 (×2): 3 g via INTRAVENOUS
  Filled 2020-10-19: qty 8
  Filled 2020-10-19 (×2): qty 3

## 2020-10-19 MED ORDER — VITAMIN D3 25 MCG (1000 UNIT) PO TABS
1000.0000 [IU] | ORAL_TABLET | Freq: Every day | ORAL | Status: DC
  Administered 2020-10-28: 1000 [IU] via ORAL
  Filled 2020-10-19 (×15): qty 1

## 2020-10-19 MED ORDER — INSULIN ASPART 100 UNIT/ML ~~LOC~~ SOLN
0.0000 [IU] | SUBCUTANEOUS | Status: DC
  Administered 2020-10-19 (×2): 1 [IU] via SUBCUTANEOUS
  Filled 2020-10-19 (×2): qty 1

## 2020-10-19 MED ORDER — ORAL CARE MOUTH RINSE
15.0000 mL | Freq: Two times a day (BID) | OROMUCOSAL | Status: DC
  Administered 2020-10-21 – 2020-10-28 (×12): 15 mL via OROMUCOSAL

## 2020-10-19 MED ORDER — PANTOPRAZOLE SODIUM 40 MG PO TBEC
40.0000 mg | DELAYED_RELEASE_TABLET | Freq: Two times a day (BID) | ORAL | Status: DC
  Filled 2020-10-19: qty 1

## 2020-10-19 NOTE — Evaluation (Signed)
Clinical/Bedside Swallow Evaluation Patient Details  Name: Kim Roy MRN: [id number] Date of Birth: January 25, 1931  Today's Date: 10/19/2020 Time: SLP Start Time (ACUTE ONLY): 1320 SLP Stop Time (ACUTE ONLY): 1350 SLP Time Calculation (min) (ACUTE ONLY): 30 min  Past Medical History:  Past Medical History:  Diagnosis Date  . Anemia in CKD (chronic kidney disease)   . Bronchitis   . Coronary artery disease   . Diabetes mellitus without complication ([facility])   . Gout   . HTN (hypertension)   . Hyperlipemia   . Metabolic encephalopathy   . Thrombocytopenia ([facility])   . TIA (transient ischemic attack)   . Vitamin D deficiency    Past Surgical History:  Past Surgical History:  Procedure Laterality Date  . FRACTURE SURGERY    . HIP PINNING,CANNULATED Left 01/20/2019   Procedure: CANNULATED HIP PINNING;  Surgeon: Kennedy Bucker, MD;  Location: [facility] ORS;  Service: Orthopedics;  Laterality: Left;   HPI:  Per admitting H&P "87 year old female admitted with aspiration pneumonia & choledocholithiasis in septic shock requiring vasopressors admitted to ICU.     History of Present Illness:  87 year old female arrived at the ED via EMS [facility] due to complaints of hypotension and altered mental status.  SNF staff reported to EMS that the patient was found less responsive than normal, BP 60/30 was obtained in the field and improved with IV fluids. It is unclear when the patient's LKW time was, SNF staff were unsure"   Assessment / Plan / Recommendation Clinical Impression  Bedside swallow eval today was limited as Pt would only take a few bites and sips. Pt tolerated sips of thin liquid and very small bites of applesauce without any s/s of aspiration. She would not vocalize to determine vocal quality after PO's. Pt took very small bites and needed encouragement to swallow. Pt took thin liquids by straw without s/s of aspiration but occasionally needed cues to take single sips and needed  ST to pull the straw away.  Rec Dysphagia 1 diet with thin liquids single sips by straw. Monitor for s/s of aspiration. Meds crushed in applesauce. Po intake likely to be poor as participation was minimal and she would barely open her mouth for the spoon. ST to follow up with toleration of diet. Discussed swallowing precautions with Nsg and posted above bed. SLP Visit Diagnosis: Dysphagia, oral phase (R13.11)    Aspiration Risk  Mild aspiration risk;Moderate aspiration risk    Diet Recommendation Dysphagia 1 (Puree)   Liquid Administration via: Straw Medication Administration: Crushed with puree Supervision: Full supervision/cueing for compensatory strategies;Staff to assist with self feeding Compensations: Minimize environmental distractions Postural Changes: Seated upright at 90 degrees;Remain upright for at least 30 minutes after po intake    Other  Recommendations Oral Care Recommendations: Oral care BID   Follow up Recommendations Skilled Nursing facility      Frequency and Duration    1 week       Prognosis Barriers to Reach Goals: Other (Comment) (participation)      Swallow Study   General Date of Onset: 10/18/20 HPI: Per admitting H&P "87 year old female admitted with aspiration pneumonia & choledocholithiasis in septic shock requiring vasopressors admitted to ICU.     History of Present Illness:  87 year old female arrived at the ED via EMS from [facility] due to complaints of hypotension and altered mental status.  SNF staff reported to EMS that the patient was found less responsive than normal, BP 60/30 was obtained in the  field and improved with IV fluids. It is unclear when the patient's LKW time was, SNF staff were unsure" Type of Study: Bedside Swallow Evaluation Diet Prior to this Study: NPO Temperature Spikes Noted: No Respiratory Status: Room air History of Recent Intubation: No Behavior/Cognition: Requires cueing;Cooperative Oral Cavity Assessment: Within  Functional Limits Oral Care Completed by SLP: No Oral Cavity - Dentition: Missing dentition Self-Feeding Abilities: Total assist Patient Positioning: Upright in bed Baseline Vocal Quality: Other (comment) (Did not verbalize)    Oral/Motor/Sensory Function Overall Oral Motor/Sensory Function: Mild impairment Facial ROM: Within Functional Limits Facial Symmetry: Within Functional Limits Facial Strength: Within Functional Limits Lingual ROM: Within Functional Limits   Ice Chips Ice chips: Within functional limits Presentation: Spoon   Thin Liquid Thin Liquid: Impaired (Needed cues to take small sips) Presentation: Straw    Nectar Thick Nectar Thick Liquid: Not tested   Honey Thick Honey Thick Liquid: Not tested   Puree Puree: Impaired Presentation: Spoon (Pursed her lips taking a very small bite) Oral Phase Functional Implications: Prolonged oral transit   Solid     Solid: Not tested      Kim Roy 10/19/2020,3:32 PM

## 2020-10-19 NOTE — Progress Notes (Addendum)
PROGRESS NOTE    Kim Roy  ZJI:[id number] DOB: 05-15-1931 DOA: 10/18/2020 PCP: [facility]   Brief Narrative: Taken from H&P. 87 year old female with medical history significant for diabetes, hypertension, dyslipidemia and CKD stage IIIa,  arrived at the ED via EMS from [facility] due to complaints of hypotension and altered mental status.  SNF staff reported to EMS that the patient was found less responsive than normal, BP 60/30 was obtained in the field and improved with IV fluids. It is unclear when the patient's LKW time was, SNF staff were unsure.  She was admitted with severe sepsis along with septic shock secondary to aspiration pneumonia initially requiring pressors in ICU. On presentation she had leukocytosis, lactic acidosis which peaked at 7.5, AKI, hypothermic and hypotensive, started on cefepime and Flagyl. CT chest and abdomen with concern of aspiration pneumonia and choledocholithiasis, GI was consulted for ERCP, as she has no sign of ascending cholangitis, pain or changes in CMP showing any obstruction, they are recommending outpatient follow-up.  Talked with daughter-in-law who is also her POA, patient with poor p.o. intake and becoming more withdrawn for the past couple of month.  She is losing weight, she was most of the time a petite lady. Discussed about her being full code and with her permission change status to DNR as she does not want to harm her more.  She will remain full scope of medical care along with DNR.  Antibiotics will switch to Unasyn today.  Subjective: Patient was lying with sheets on top of her head, refusing to talk, just said no when asked about any pain.  Assessment & Plan:   Active Problems:   Type II diabetes mellitus with renal manifestations (HCC)   Septic shock (HCC)   Aspiration pneumonia of right lung (HCC)   Acute-on-chronic kidney injury (HCC)  Severe sepsis with septic shock secondary to  aspiration pneumonia. Off the pressors now.  Lactic acidosis has been resolved.  Urine and blood cultures remain negative.  As there is no sign of ascending cholangitis and no need for any emergent ERCP with normal liver function, positive procalcitonin, we will discontinue cefepime and Flagyl and switch antibiotics to Unasyn. -Obtained speech evaluation to prevent further aspiration. -CODE STATUS changed to DNR after talking with POA who is her daughter-in-law. -PT/OT evaluation. -Continue with Unasyn  Choledocholithiasis.  No other evidence of intra-abdominal infection or obstruction.  She needs to follow-up with GI as an outpatient for elective ERCP. No pain or tenderness today.  AKI with CKD stage IIIa.  Patient appears very dehydrated. -Continue with IV hydration -Monitor renal function -Avoid nephrotoxins  Anion gap metabolic acidosis.  Most likely secondary to sepsis. Seems improving. -Continue with IV fluid.  Hypertension.  Blood pressure within goal. Patient was on carvedilol and Zestoretic at home. Holding antihypertensives as she was hypotensive with septic shock. We will resume as needed.  Zestoretic will be held until renal function improves.  Type 2 diabetes mellitus.  Patient was on Metformin at home. -Sensitive SSI. -Check A1c  Hyperlipidemia. -Continue with Lipitor  Protein caloric malnutrition. -Dietitian consult  Objective: Vitals:   10/19/20 0900 10/19/20 1000 10/19/20 1100 10/19/20 1200  BP: (!) 122/55 (!) 91/48 (!) 154/68 (!) 111/58  Pulse:  66    Resp: 13 12 14 19   Temp: 97.88 F (36.6 C) 97.7 F (36.5 C) (!) 97.16 F (36.2 C) (!) 97.16 F (36.2 C)  TempSrc:  Rectal    SpO2: 94% 91%  Weight:      Height:        Intake/Output Summary (Last 24 hours) at 10/19/2020 1241 Last data filed at 10/19/2020 2836 Gross per 24 hour  Intake 3420.51 ml  Output -  Net 3420.51 ml   Filed Weights   10/18/20 0138  Weight: 56.7 kg     Examination:  General exam: Frail elderly lady, appears calm and comfortable  Respiratory system: Clear to auscultation. Respiratory effort normal. Cardiovascular system: S1 & S2 heard, RRR.  Gastrointestinal system: Soft, nontender, nondistended, bowel sounds positive. Central nervous system: Alert and oriented. No focal neurological deficits. Extremities: No edema, no cyanosis, pulses intact and symmetrical. Psychiatry: Judgement and insight appear impaired   DVT prophylaxis: Subcu heparin Code Status: DNR Family Communication: Discussed with daughter-in-law on phone Disposition Plan:  Status is: Inpatient  Remains inpatient appropriate because:Inpatient level of care appropriate due to severity of illness   Dispo: The patient is from: SNF              Anticipated d/c is to: SNF              Patient currently is not medically stable to d/c.   Difficult to place patient No               Level of care: Stepdown  All the records are reviewed and case discussed with Care Management/Social Worker. Management plans discussed with the patient, nursing and they are in agreement.  Consultants:   PCCM  GI  Procedures:  Antimicrobials:  Unasyn  Data Reviewed: I have personally reviewed following labs and imaging studies  CBC: Recent Labs  Lab 10/18/20 0150  WBC 5.8  NEUTROABS 3.6  HGB 7.4*  HCT 24.5*  MCV 94.6  PLT 133*   Basic Metabolic Panel: Recent Labs  Lab 10/18/20 0150 10/19/20 0557  NA 142 138  K 3.1* 4.6  CL 117* 111  CO2 12* 17*  GLUCOSE 66* 117*  BUN 54* 60*  CREATININE 2.37* 3.31*  CALCIUM 6.7* 8.2*  MG  --  1.2*  PHOS  --  3.7   GFR: Estimated Creatinine Clearance: 10.7 mL/min (A) (by C-G formula based on SCr of 3.31 mg/dL (H)). Liver Function Tests: Recent Labs  Lab 10/18/20 0150  AST 18  ALT 7  ALKPHOS 31*  BILITOT 1.0  PROT 4.1*  ALBUMIN 2.0*   Recent Labs  Lab 10/18/20 0440  LIPASE 24   Recent Labs  Lab 10/18/20 0150   AMMONIA 26   Coagulation Profile: No results for input(s): INR, PROTIME in the last 168 hours. Cardiac Enzymes: No results for input(s): CKTOTAL, CKMB, CKMBINDEX, TROPONINI in the last 168 hours. BNP (last 3 results) No results for input(s): PROBNP in the last 8760 hours. HbA1C: No results for input(s): HGBA1C in the last 72 hours. CBG: Recent Labs  Lab 10/18/20 1907 10/18/20 2307 10/19/20 0349 10/19/20 0742 10/19/20 1114  GLUCAP 94 91 92 95 102*   Lipid Profile: No results for input(s): CHOL, HDL, LDLCALC, TRIG, CHOLHDL, LDLDIRECT in the last 72 hours. Thyroid Function Tests: No results for input(s): TSH, T4TOTAL, FREET4, T3FREE, THYROIDAB in the last 72 hours. Anemia Panel: No results for input(s): VITAMINB12, FOLATE, FERRITIN, TIBC, IRON, RETICCTPCT in the last 72 hours. Sepsis Labs: Recent Labs  Lab 10/18/20 0440 10/18/20 0459 10/18/20 1138 10/18/20 2144 10/19/20 0557  PROCALCITON  --  0.10  --   --  0.65  LATICACIDVEN 6.9*  --  7.5* 2.3* 1.5  Recent Results (from the past 240 hour(s))  Culture, blood (Routine X 2) w Reflex to ID Panel     Status: None (Preliminary result)   Collection Time: 10/18/20  1:50 AM   Specimen: BLOOD  Result Value Ref Range Status   Specimen Description BLOOD BLOOD RIGHT FOREARM  Final   Special Requests   Final    BOTTLES DRAWN AEROBIC AND ANAEROBIC Blood Culture adequate volume   Culture   Final    NO GROWTH 1 DAY Performed at [facility], [address] [place], [place] [address]    Report Status PENDING  Incomplete  Culture, blood (Routine X 2) w Reflex to ID Panel     Status: None (Preliminary result)   Collection Time: 10/18/20  1:50 AM   Specimen: BLOOD  Result Value Ref Range Status   Specimen Description BLOOD BLOOD RIGHT FOREARM  Final   Special Requests   Final    BOTTLES DRAWN AEROBIC AND ANAEROBIC Blood Culture adequate volume   Culture   Final    NO GROWTH 1 DAY Performed at [facility],  [address] [place], [place] [address]    Report Status PENDING  Incomplete  Urine Culture     Status: None   Collection Time: 10/18/20  1:50 AM   Specimen: Urine, Random  Result Value Ref Range Status   Specimen Description   Final    URINE, RANDOM Performed at [facility], [address] [place], [place] [address]    Special Requests   Final    NONE Performed at [facility], [address] [place], [place] [address]    Culture   Final    NO GROWTH Performed at [facility], [address] [place], [place] [address]    Report Status 10/19/2020 FINAL  Final  Resp Panel by RT-PCR (Flu A&B, Covid) Nasopharyngeal Swab     Status: None   Collection Time: 10/18/20  3:04 AM   Specimen: Nasopharyngeal Swab; Nasopharyngeal(NP) swabs in vial transport medium  Result Value Ref Range Status   SARS Coronavirus 2 by RT PCR NEGATIVE NEGATIVE Final    Comment: (NOTE) SARS-CoV-2 target nucleic acids are NOT DETECTED.  The SARS-CoV-2 RNA is generally detectable in upper respiratory specimens during the acute phase of infection. The lowest concentration of SARS-CoV-2 viral copies this assay can detect is 138 copies/mL. A negative result does not preclude SARS-Cov-2 infection and should not be used as the sole basis for treatment or other patient management decisions. A negative result may occur with  improper specimen collection/handling, submission of specimen other than nasopharyngeal swab, presence of viral mutation(s) within the areas targeted by this assay, and inadequate number of viral copies(<138 copies/mL). A negative result must be combined with clinical observations, patient history, and epidemiological information. The expected result is Negative.  Fact Sheet for Patients:  BloggerCourse.comhttps://www.fda.gov/media/152166/download  Fact Sheet for Healthcare Providers:  SeriousBroker.ithttps://www.fda.gov/media/152162/download  This test is no t yet approved or cleared by  the [place] FDA and  has been authorized for detection and/or diagnosis of SARS-CoV-2 by FDA under an Emergency Use Authorization (EUA). This EUA will remain  in effect (meaning this test can be used) for the duration of the COVID-19 declaration under Section 564(b)(1) of the Act, 21 U.S.C.section 360bbb-3(b)(1), unless the authorization is terminated  or revoked sooner.       Influenza A by PCR NEGATIVE NEGATIVE Final   Influenza B by PCR NEGATIVE NEGATIVE Final  Comment: (NOTE) The Xpert Xpress SARS-CoV-2/FLU/RSV plus assay is intended as an aid in the diagnosis of influenza from Nasopharyngeal swab specimens and should not be used as a sole basis for treatment. Nasal washings and aspirates are unacceptable for Xpert Xpress SARS-CoV-2/FLU/RSV testing.  Fact Sheet for Patients: BloggerCourse.com  Fact Sheet for Healthcare Providers: SeriousBroker.it  This test is not yet approved or cleared by the [place] FDA and has been authorized for detection and/or diagnosis of SARS-CoV-2 by FDA under an Emergency Use Authorization (EUA). This EUA will remain in effect (meaning this test can be used) for the duration of the COVID-19 declaration under Section 564(b)(1) of the Act, 21 U.S.C. section 360bbb-3(b)(1), unless the authorization is terminated or revoked.  Performed at [facility], [address] [place], [place] [address]      Radiology Studies: CT ABDOMEN PELVIS WO CONTRAST  Result Date: 10/18/2020 CLINICAL DATA:  Sepsis, lactic acidosis EXAM: CT CHEST, ABDOMEN AND PELVIS WITHOUT CONTRAST TECHNIQUE: Multidetector CT imaging of the chest, abdomen and pelvis was performed following the standard protocol without IV contrast. COMPARISON:  None. FINDINGS: CT CHEST FINDINGS Cardiovascular: Extensive multi-vessel coronary artery calcification. Cardiac size within normal limits. No pericardial effusion. Central  pulmonary arteries are enlarged in keeping with changes of pulmonary arterial hypertension. Extensive calcified atherosclerotic plaque and arteriosclerosis is seen involving the thoracic aorta. No aortic aneurysm. Mediastinum/Nodes: No pathologic thoracic adenopathy. Esophagus unremarkable. Thyroid unremarkable. Lungs/Pleura: There is debris within the a right mainstem bronchus and peripheral airway impaction within the right lower lobe. There is ground-glass infiltrate noted peripherally within the right lower lobe, best noted at the costophrenic angle. The findings are most suggestive of aspiration, less likely acute infection. No pneumothorax or pleural effusion. Mild parenchymal scarring at the left lung base. Musculoskeletal: No acute bone abnormality. No lytic or blastic bone lesion. CT ABDOMEN PELVIS FINDINGS Hepatobiliary: Multiple calculi are seen within a contracted gallbladder lumen. No pericholecystic inflammatory change. Pneumobilia is present, possibly related prior sphincterotomy. There is choledocholithiasis present with a 9 mm calculus seen within the distal common duct, best noted on coronal image # 56. The extrahepatic bile duct is mildly dilated. The liver is otherwise unremarkable. Pancreas: Unremarkable Spleen: Unremarkable Adrenals/Urinary Tract: The adrenal glands are unremarkable. 2 mm nonobstructing calculus is seen within the lower pole of the right kidney. Simple cortical cyst noted within the interpolar region of the left kidney. The kidneys are otherwise unremarkable. The bladder is decompressed and is largely obscured by streak artifact from bilateral hip prostheses. Stomach/Bowel: Moderate descending and sigmoid colonic diverticulosis noted. The stomach, small bowel, and large bowel are otherwise unremarkable. Appendix absent. No free intraperitoneal gas or fluid. Vascular/Lymphatic: There is extensive aortoiliac atherosclerotic calcification. No aneurysm. No pathologic adenopathy  within the abdomen and pelvis. Reproductive: Dense calcification within the uterine corpus likely relates to an involuted uterine fibroid. The pelvic organs are otherwise unremarkable. Other: No abdominal wall hernia.  Rectum unremarkable. Musculoskeletal: Bilateral femoral neck pinning has been performed. No acute bone abnormality. No lytic or blastic bone lesions. IMPRESSION: Choledocholithiasis. Mild pneumobilia is present, suggesting changes of prior sphincterotomy. Cholelithiasis. Debris within the right mainstem bronchus, airway impaction within the right lower lobe, and superimposed dependent infiltrate all in keeping with probable changes of aspiration. Minimal right nonobstructing nephrolithiasis. Moderate distal colonic diverticulosis. Aortic Atherosclerosis (ICD10-I70.0). Electronically Signed   By: Helyn Numbers MD   On: 10/18/2020 04:08   CT Head Wo Contrast  Result Date: 10/18/2020 CLINICAL DATA:  Mental status  change EXAM: CT HEAD WITHOUT CONTRAST TECHNIQUE: Contiguous axial images were obtained from the base of the skull through the vertex without intravenous contrast. COMPARISON:  May 01, 2020 FINDINGS: Brain: No evidence of acute territorial infarction, hemorrhage, hydrocephalus,extra-axial collection or mass lesion/mass effect. There is dilatation the ventricles and sulci consistent with age-related atrophy. Low-attenuation changes in the deep white matter consistent with small vessel ischemia. Lacunar infarcts are again noted within the bilateral basal ganglia. Vascular: No hyperdense vessel or unexpected calcification. Vertebral artery calcifications are seen. Skull: The skull is intact. No fracture or focal lesion identified. Sinuses/Orbits: The visualized paranasal sinuses and mastoid air cells are clear. The orbits and globes intact. Other: None IMPRESSION: No acute intracranial abnormality. Findings consistent with age related atrophy and chronic small vessel ischemia  Electronically Signed   By: Jonna Clark M.D.   On: 10/18/2020 03:52   CT Chest Wo Contrast  Result Date: 10/18/2020 CLINICAL DATA:  Sepsis, lactic acidosis EXAM: CT CHEST, ABDOMEN AND PELVIS WITHOUT CONTRAST TECHNIQUE: Multidetector CT imaging of the chest, abdomen and pelvis was performed following the standard protocol without IV contrast. COMPARISON:  None. FINDINGS: CT CHEST FINDINGS Cardiovascular: Extensive multi-vessel coronary artery calcification. Cardiac size within normal limits. No pericardial effusion. Central pulmonary arteries are enlarged in keeping with changes of pulmonary arterial hypertension. Extensive calcified atherosclerotic plaque and arteriosclerosis is seen involving the thoracic aorta. No aortic aneurysm. Mediastinum/Nodes: No pathologic thoracic adenopathy. Esophagus unremarkable. Thyroid unremarkable. Lungs/Pleura: There is debris within the a right mainstem bronchus and peripheral airway impaction within the right lower lobe. There is ground-glass infiltrate noted peripherally within the right lower lobe, best noted at the costophrenic angle. The findings are most suggestive of aspiration, less likely acute infection. No pneumothorax or pleural effusion. Mild parenchymal scarring at the left lung base. Musculoskeletal: No acute bone abnormality. No lytic or blastic bone lesion. CT ABDOMEN PELVIS FINDINGS Hepatobiliary: Multiple calculi are seen within a contracted gallbladder lumen. No pericholecystic inflammatory change. Pneumobilia is present, possibly related prior sphincterotomy. There is choledocholithiasis present with a 9 mm calculus seen within the distal common duct, best noted on coronal image # 56. The extrahepatic bile duct is mildly dilated. The liver is otherwise unremarkable. Pancreas: Unremarkable Spleen: Unremarkable Adrenals/Urinary Tract: The adrenal glands are unremarkable. 2 mm nonobstructing calculus is seen within the lower pole of the right kidney. Simple  cortical cyst noted within the interpolar region of the left kidney. The kidneys are otherwise unremarkable. The bladder is decompressed and is largely obscured by streak artifact from bilateral hip prostheses. Stomach/Bowel: Moderate descending and sigmoid colonic diverticulosis noted. The stomach, small bowel, and large bowel are otherwise unremarkable. Appendix absent. No free intraperitoneal gas or fluid. Vascular/Lymphatic: There is extensive aortoiliac atherosclerotic calcification. No aneurysm. No pathologic adenopathy within the abdomen and pelvis. Reproductive: Dense calcification within the uterine corpus likely relates to an involuted uterine fibroid. The pelvic organs are otherwise unremarkable. Other: No abdominal wall hernia.  Rectum unremarkable. Musculoskeletal: Bilateral femoral neck pinning has been performed. No acute bone abnormality. No lytic or blastic bone lesions. IMPRESSION: Choledocholithiasis. Mild pneumobilia is present, suggesting changes of prior sphincterotomy. Cholelithiasis. Debris within the right mainstem bronchus, airway impaction within the right lower lobe, and superimposed dependent infiltrate all in keeping with probable changes of aspiration. Minimal right nonobstructing nephrolithiasis. Moderate distal colonic diverticulosis. Aortic Atherosclerosis (ICD10-I70.0). Electronically Signed   By: Helyn Numbers MD   On: 10/18/2020 04:08   DG Chest Portable 1 View  Result Date: 10/18/2020  CLINICAL DATA:  Altered mental status, hypotension EXAM: PORTABLE CHEST 1 VIEW COMPARISON:  None. FINDINGS: The lungs are symmetrically well inflated. Mild bibasilar atelectasis or infiltrate, left greater than right. No pneumothorax or pleural effusion. Cardiac size within normal limits. Atherosclerotic calcifications seen within the aortic lumen. Pulmonary vascularity is normal. No acute bone abnormality. IMPRESSION: Bibasilar atelectasis or infiltrate. Electronically Signed   By: Helyn Numbers MD   On: 10/18/2020 02:51    Scheduled Meds: . aspirin  81 mg Oral Daily  . atorvastatin  40 mg Oral Daily  . Chlorhexidine Gluconate Cloth  6 each Topical Daily  . heparin  5,000 Units Subcutaneous Q8H  . hydrocortisone sod succinate (SOLU-CORTEF) inj  50 mg Intravenous Q6H  . pantoprazole (PROTONIX) IV  40 mg Intravenous Q24H   Continuous Infusions: . ceFEPime (MAXIPIME) IV 2 g (10/19/20 1024)  . lactated ringers 100 mL/hr at 10/19/20 0917  . metronidazole 500 mg (10/19/20 1136)  . norepinephrine (LEVOPHED) Adult infusion Stopped (10/18/20 1356)     LOS: 1 day   Time spent: 45 minutes. More than 50% of the time was spent in counseling/coordination of care  Arnetha Courser, MD [facility]  If 7PM-7AM, please contact night-coverage [email or web address]  10/19/2020, 12:41 PM   This record has been created using Conservation officer, historic buildings. Errors have been sought and corrected,but may not always be located. Such creation errors do not reflect on the standard of care.

## 2020-10-19 NOTE — Consult Note (Signed)
PHARMACY CONSULT NOTE - FOLLOW UP  Pharmacy Consult for Electrolyte Monitoring and Replacement   Recent Labs: Potassium (mmol/L)  Date Value  10/19/2020 4.6   Magnesium (mg/dL)  Date Value  79/72/8206 1.2 (L)   Calcium (mg/dL)  Date Value  01/56/1537 8.2 (L)   Albumin (g/dL)  Date Value  94/32/7614 2.0 (L)   Phosphorus (mg/dL)  Date Value  70/92/9574 3.7   Sodium (mmol/L)  Date Value  10/19/2020 138  K+: 3.1>4.6 Corrected Ca 8.3>9.4  Mg2+: 1.2  Assessment: 87 year old female admitted with aspiration pneumonia & choledocholithiasis in septic shock requiring vasopressors admitted to ICU.  Goal of Therapy:  WNL  Plan:  Patient is ordered for Mg Sulfate 4g x1. No further repletion warranted. F/u with AM labs. Monitor closely due to renal function.   Martyn Malay ,PharmD Clinical Pharmacist 10/19/2020 9:52 AM

## 2020-10-19 NOTE — Progress Notes (Signed)
Chart reviewed, Pt assessed. Bedside swallow eval completed, report to follow. Pt tolerated sips of thin and very small bites of applesauce without any s/s of aspiration. Pt took very small bites anfd needed encouragement to swallow. Occassional bolus hold with thin by straw. Rec Dysphagia 1 diet with thin liquids single sips by straw. Monitor for s/s of aspiration. Meds crushed in applesauce. Po intake likely to be poor.

## 2020-10-19 NOTE — Progress Notes (Signed)
Patient refused all PO meds this afternoon as well as lunch and dinner. Offered verbally and then held spoon up to patient's lips. Patient refused to open mouth for food; patient clearly stated "no" when asked if she wanted to eat and then pulled blanket back over her head.

## 2020-10-19 NOTE — Progress Notes (Signed)
Pharmacy Antibiotic Note  Kim Roy is a 87 y.o. female admitted on 10/18/2020. Pharmacy has been consulted for Unasyn dosing.  Plan: Unasyn 3 g IV q24h based on borderline CrCl and declining renal function. Will follow up renal function with morning labs to assess for necessary adjustments.  Height: 5\' 5"  (165.1 cm) Weight: 56.7 kg (125 lb) IBW/kg (Calculated) : 57  Temp (24hrs), Avg:98.1 F (36.7 C), Min:97.16 F (36.2 C), Max:99.32 F (37.4 C)  Recent Labs  Lab 10/18/20 0150 10/18/20 0440 10/18/20 1138 10/18/20 2144 10/19/20 0557  WBC 5.8  --   --   --   --   CREATININE 2.37*  --   --   --  3.31*  LATICACIDVEN 5.4* 6.9* 7.5* 2.3* 1.5    Estimated Creatinine Clearance: 10.7 mL/min (A) (by C-G formula based on SCr of 3.31 mg/dL (H)).    No Known Allergies  Antimicrobials this admission: Vancomycin 2/27 x 1 Cefepime 2/27 >> 2/28 Flagyl 2/27 >> 2/28 Unasyn 2/28 >>   Microbiology results: 2/27 BCx: NG 2/27 UCx: NG   Thank you for allowing pharmacy to be a part of this patient's care.  3/27, PharmD 10/19/2020 1:30 PM

## 2020-10-20 ENCOUNTER — Inpatient Hospital Stay: Payer: Medicare (Managed Care)

## 2020-10-20 DIAGNOSIS — A419 Sepsis, unspecified organism: Secondary | ICD-10-CM | POA: Diagnosis not present

## 2020-10-20 DIAGNOSIS — N179 Acute kidney failure, unspecified: Secondary | ICD-10-CM | POA: Diagnosis not present

## 2020-10-20 DIAGNOSIS — J690 Pneumonitis due to inhalation of food and vomit: Secondary | ICD-10-CM | POA: Diagnosis not present

## 2020-10-20 DIAGNOSIS — R4182 Altered mental status, unspecified: Secondary | ICD-10-CM | POA: Diagnosis not present

## 2020-10-20 DIAGNOSIS — E43 Unspecified severe protein-calorie malnutrition: Secondary | ICD-10-CM | POA: Insufficient documentation

## 2020-10-20 DIAGNOSIS — L8990 Pressure ulcer of unspecified site, unspecified stage: Secondary | ICD-10-CM | POA: Insufficient documentation

## 2020-10-20 DIAGNOSIS — Z515 Encounter for palliative care: Secondary | ICD-10-CM

## 2020-10-20 DIAGNOSIS — Z7189 Other specified counseling: Secondary | ICD-10-CM

## 2020-10-20 LAB — GLUCOSE, CAPILLARY
Glucose-Capillary: 105 mg/dL — ABNORMAL HIGH (ref 70–99)
Glucose-Capillary: 91 mg/dL (ref 70–99)
Glucose-Capillary: 93 mg/dL (ref 70–99)
Glucose-Capillary: 103 mg/dL — ABNORMAL HIGH (ref 70–99)
Glucose-Capillary: 104 mg/dL — ABNORMAL HIGH (ref 70–99)

## 2020-10-20 LAB — COMPREHENSIVE METABOLIC PANEL
ALT: 8 U/L (ref 0–44)
AST: 14 U/L — ABNORMAL LOW (ref 15–41)
Albumin: 2.2 g/dL — ABNORMAL LOW (ref 3.5–5.0)
Alkaline Phosphatase: 29 U/L — ABNORMAL LOW (ref 38–126)
Anion gap: 10 (ref 5–15)
BUN: 58 mg/dL — ABNORMAL HIGH (ref 8–23)
CO2: 17 mmol/L — ABNORMAL LOW (ref 22–32)
Calcium: 8.2 mg/dL — ABNORMAL LOW (ref 8.9–10.3)
Chloride: 113 mmol/L — ABNORMAL HIGH (ref 98–111)
Creatinine, Ser: 3.24 mg/dL — ABNORMAL HIGH (ref 0.44–1.00)
GFR, Estimated: 13 mL/min — ABNORMAL LOW (ref >60)
Glucose, Bld: 106 mg/dL — ABNORMAL HIGH (ref 70–99)
Potassium: 4 mmol/L (ref 3.5–5.1)
Sodium: 140 mmol/L (ref 135–145)
Total Bilirubin: 0.7 mg/dL (ref 0.3–1.2)
Total Protein: 4.6 g/dL — ABNORMAL LOW (ref 6.5–8.1)

## 2020-10-20 LAB — PROCALCITONIN: Procalcitonin: 0.63 ng/mL

## 2020-10-20 LAB — PHOSPHORUS: Phosphorus: 3.5 mg/dL (ref 2.5–4.6)

## 2020-10-20 MED ORDER — ENSURE ENLIVE PO LIQD: 237.0000 mL | Freq: Two times a day (BID) | ORAL | Status: DC

## 2020-10-20 MED ORDER — LACTATED RINGERS IV SOLN: INTRAVENOUS | Status: AC

## 2020-10-20 MED ORDER — ADULT MULTIVITAMIN W/MINERALS CH
1.0000 | ORAL_TABLET | Freq: Every day | ORAL | Status: DC
  Administered 2020-10-24 – 2020-10-28 (×2): 1 via ORAL
  Filled 2020-10-20 (×5): qty 1

## 2020-10-20 NOTE — Progress Notes (Signed)
[name], MD [facility]   [address] [place], [place] [address] Phone: [phone] Fax : [phone]   Subjective: The patient has aspiration pneumonia and a common bile duct stone was found.  The patient's liver enzymes have not shown any elevation consistent with biliary obstruction.  The patient is still in the ICU with malnutrition and recovering from sepsis.   Objective: Vital signs in last 24 hours: Vitals:   10/20/20 0400 10/20/20 0600 10/20/20 0701 10/20/20 0800  BP: (!) 90/48 122/64  (!) 116/57  Pulse: 65 74 67   Resp: 12 11 15 12   Temp: (!) 96.98 F (36.1 C) 98.24 F (36.8 C) 98.78 F (37.1 C) 98.6 F (37 C)  TempSrc:      SpO2: 100% 100% 99%   Weight:      Height:       Weight change:   Intake/Output Summary (Last 24 hours) at 10/20/2020 1252 Last data filed at 10/20/2020 0600 Gross per 24 hour  Intake 1831.64 ml  Output 800 ml  Net 1031.64 ml     Exam: General: Patient resting comfortably in bed in no apparent distress   Lab Results: @LABTEST2 @ Micro Results: Recent Results (from the past 240 hour(s))  Culture, blood (Routine X 2) w Reflex to ID Panel     Status: None (Preliminary result)   Collection Time: 10/18/20  1:50 AM   Specimen: BLOOD  Result Value Ref Range Status   Specimen Description BLOOD BLOOD RIGHT FOREARM  Final   Special Requests   Final    BOTTLES DRAWN AEROBIC AND ANAEROBIC Blood Culture adequate volume   Culture   Final    NO GROWTH 2 DAYS Performed at [facility], [address], [place], [address] [place]    Report Status PENDING  Incomplete  Culture, blood (Routine X 2) w Reflex to ID Panel     Status: None (Preliminary result)   Collection Time: 10/18/20  1:50 AM   Specimen: BLOOD  Result Value Ref Range Status   Specimen Description BLOOD BLOOD RIGHT FOREARM  Final   Special Requests   Final    BOTTLES DRAWN AEROBIC AND ANAEROBIC Blood Culture adequate volume   Culture   Final    NO GROWTH 2  DAYS Performed at [facility], [address] [place], [address] [place]    Report Status PENDING  Incomplete  Urine Culture     Status: None   Collection Time: 10/18/20  1:50 AM   Specimen: Urine, Random  Result Value Ref Range Status   Specimen Description   Final    URINE, RANDOM Performed at [facility], [address], [place], [address] [place]    Special Requests   Final    NONE Performed at [facility], [address], [place], [address] [place]    Culture   Final    NO GROWTH Performed at [facility], [address], [place], [address] [place]    Report Status 10/19/2020 FINAL  Final  Resp Panel by RT-PCR (Flu A&B, Covid) Nasopharyngeal Swab     Status: None   Collection Time: 10/18/20  3:04 AM   Specimen: Nasopharyngeal Swab; Nasopharyngeal(NP) swabs in vial transport medium  Result Value Ref Range Status   SARS Coronavirus 2 by RT PCR NEGATIVE NEGATIVE Final    Comment: (NOTE) SARS-CoV-2 target nucleic acids are NOT DETECTED.  The SARS-CoV-2 RNA is generally detectable in upper respiratory specimens during the acute phase of  infection. The lowest concentration of SARS-CoV-2 viral copies this assay can detect is 138 copies/mL. A negative result does not preclude SARS-Cov-2 infection and should not be used as the sole basis for treatment or other patient management decisions. A negative result may occur with  improper specimen collection/handling, submission of specimen other than nasopharyngeal swab, presence of viral mutation(s) within the areas targeted by this assay, and inadequate number of viral copies(<138 copies/mL). A negative result must be combined with clinical observations, patient history, and epidemiological information. The expected result is Negative.  Fact Sheet for Patients:  BloggerCourse.com  Fact Sheet for Healthcare Providers:   SeriousBroker.it  This test is no t yet approved or cleared by the [place] FDA and  has been authorized for detection and/or diagnosis of SARS-CoV-2 by FDA under an Emergency Use Authorization (EUA). This EUA will remain  in effect (meaning this test can be used) for the duration of the COVID-19 declaration under Section 564(b)(1) of the Act, 21 U.S.C.section 360bbb-3(b)(1), unless the authorization is terminated  or revoked sooner.       Influenza A by PCR NEGATIVE NEGATIVE Final   Influenza B by PCR NEGATIVE NEGATIVE Final    Comment: (NOTE) The Xpert Xpress SARS-CoV-2/FLU/RSV plus assay is intended as an aid in the diagnosis of influenza from Nasopharyngeal swab specimens and should not be used as a sole basis for treatment. Nasal washings and aspirates are unacceptable for Xpert Xpress SARS-CoV-2/FLU/RSV testing.  Fact Sheet for Patients: BloggerCourse.com  Fact Sheet for Healthcare Providers: SeriousBroker.it  This test is not yet approved or cleared by the [place] FDA and has been authorized for detection and/or diagnosis of SARS-CoV-2 by FDA under an Emergency Use Authorization (EUA). This EUA will remain in effect (meaning this test can be used) for the duration of the COVID-19 declaration under Section 564(b)(1) of the Act, 21 U.S.C. section 360bbb-3(b)(1), unless the authorization is terminated or revoked.  Performed at [facility], [address] [place], [place] [address]    Studies/Results: No results found. Medications: I have reviewed the patient's current medications. Scheduled Meds: . aspirin  81 mg Oral Daily  . atorvastatin  40 mg Oral Daily  . Chlorhexidine Gluconate Cloth  6 each Topical Daily  . cholecalciferol  1,000 Units Oral Daily  . feeding supplement  237 mL Oral BID BM  . heparin  5,000 Units Subcutaneous Q8H  . hydrocortisone sod succinate  (SOLU-CORTEF) inj  50 mg Intravenous Daily  . insulin aspart  0-9 Units Subcutaneous Q4H  . iron polysaccharides  150 mg Oral Daily  . mouth rinse  15 mL Mouth Rinse BID  . [START ON 10/21/2020] multivitamin with minerals  1 tablet Oral Daily  . pantoprazole  40 mg Oral BID   Continuous Infusions: . ampicillin-sulbactam (UNASYN) IV Stopped (10/19/20 2341)  . lactated ringers 100 mL/hr at 10/20/20 0902   PRN Meds:.docusate sodium, polyethylene glycol   Assessment: Active Problems:   Type II diabetes mellitus with renal manifestations (HCC)   Septic shock (HCC)   Aspiration pneumonia of right lung (HCC)   Acute renal failure (HCC)   Altered mental status   Hypoglycemia   Choledocholithiasis   Anemia   Pressure injury of skin   Protein-calorie malnutrition, severe    Plan: This patient is in the ICU with aspiration pneumonia of the right lung with septic shock and was found to have a common bile duct stone.  The patient is in no condition to undergo an  ERCP and post ERCP pancreatitis could be fatal for this patient.  I recommend that the patient improves and is sent home then consider doing the ERCP as an outpatient with a patient is more stable.  At this time there is nothing further to do from a GI point of view.  I will sign off.  Please call if any further GI concerns or questions.  We would like to thank you for the opportunity to participate in the care of Kim Roy.      LOS: 2 days   Sherlyn Hay 10/20/2020, 12:52 PM Pager [phone] 7am-5pm  Check AMION for 5pm -7am coverage and on weekends

## 2020-10-20 NOTE — Progress Notes (Signed)
PROGRESS NOTE    Kim Roy  EQA:[id number] DOB: 1930-09-21 DOA: 10/18/2020 PCP: [facility]   Brief Narrative: Taken from H&P. 87 year old female with medical history significant for diabetes, hypertension, dyslipidemia and CKD stage IIIa,  arrived at the ED via EMS [facility] [place] [facility] due to complaints of hypotension and altered mental status.  SNF staff reported to EMS that the patient was found less responsive than normal, BP 60/30 was obtained in the field and improved with IV fluids. It is unclear when the patient's LKW time was, SNF staff were unsure.  She was admitted with severe sepsis along with septic shock secondary to aspiration pneumonia initially requiring pressors in ICU. On presentation she had leukocytosis, lactic acidosis which peaked at 7.5, AKI, hypothermic and hypotensive, started on cefepime and Flagyl. CT chest and abdomen with concern of aspiration pneumonia and choledocholithiasis, GI was consulted for ERCP, as she has no sign of ascending cholangitis, pain or changes in CMP showing any obstruction, they are recommending outpatient follow-up.  Talked with daughter-in-law who is also her POA, patient with poor p.o. intake and becoming more withdrawn for the past couple of month.  She is losing weight, she was most of the time a petite lady. Discussed about her being full code and with her permission change status to DNR as she does not want to harm her more.  She will remain full scope of medical care along with DNR.  Antibiotics was switch to Unasyn today.  Repeating CT head for family concern of his stroke although initial CT head was negative.  Does not appear to have any focal deficit but she was not cooperative with exam. Palliative care was consulted as patient is refusing all the p.o. intake.  Subjective: Patient remained quite withdrawn, refusing all the p.o. intake.  She just open eyes and did not answer any questions.  Not  following any commands.  Family concerned that she is more withdrawn than her baseline, they were concerned about stroke.  Initial CT head was negative.  We will repeat CT head.  Assessment & Plan:   Active Problems:   Type II diabetes mellitus with renal manifestations (HCC)   Septic shock (HCC)   Aspiration pneumonia of right lung (HCC)   Acute renal failure (HCC)   Altered mental status   Hypoglycemia   Choledocholithiasis   Anemia   Pressure injury of skin   Protein-calorie malnutrition, severe  Severe sepsis with septic shock secondary to aspiration pneumonia. Off the pressors now.  Lactic acidosis has been resolved.  Urine and blood cultures remain negative.  As there is no sign of ascending cholangitis and no need for any emergent ERCP with normal liver function, positive procalcitonin, we will discontinue cefepime and Flagyl and switch antibiotics to Unasyn. -Obtained speech evaluation to prevent further aspiration. -CODE STATUS changed to DNR after talking with POA who is her daughter-in-law. -PT/OT evaluation. -Continue with Unasyn  Altered mental status.  Patient is becoming more withdrawn and refusing all the p.o. intake.  Family concern of stroke as they think she is more withdrawn and had normal.  At baseline she does not talk much and tried to wrap herself with sheets when I ask more questions. Patient with poor p.o. intake and worsening functional status for the past few months.  History of weight loss with poor p.o. intake. Depression can be another possibility. -We will repeat CT head. -Palliative care consult -Psych consult to rule out depression  Choledocholithiasis.  No other evidence of intra-abdominal infection or obstruction.  She needs to follow-up with GI as an outpatient for elective ERCP. No pain or tenderness today.  AKI with CKD stage IIIa.  Patient appears very dehydrated. -Continue with IV hydration -Monitor renal function -Avoid  nephrotoxins  Anion gap metabolic acidosis.  Most likely secondary to sepsis.  Gap closed. Seems improving. -Continue with IV fluid.  Hypertension.  Blood pressure within goal. Patient was on carvedilol and Zestoretic at home. Holding antihypertensives as she was hypotensive with septic shock. We will resume as needed.  Zestoretic will be held until renal function improves.  Type 2 diabetes mellitus.  Patient was on Metformin at home.  A1c at 6.1 -Sensitive SSI.  Hyperlipidemia. -Continue with Lipitor  Protein caloric malnutrition. -Dietitian consult  Objective: Vitals:   10/20/20 1000 10/20/20 1100 10/20/20 1200 10/20/20 1300  BP: 126/65  127/62   Pulse: 63   68  Resp: Temp: 97.88 F (36.6 C) 97.88 F (36.6 C) 97.88 F (36.6 C) 97.7 F (36.5 C)  TempSrc:      SpO2: 98%   98%  Weight:      Height:        Intake/Output Summary (Last 24 hours) at 10/20/2020 1440 Last data filed at 10/20/2020 0600 Gross per 24 hour  Intake 1104.26 ml  Output 700 ml  Net 404.26 ml   Filed Weights   10/18/20 0138  Weight: 56.7 kg    Examination:  General.  Frail and emaciated elderly lady, not making eye contact, in no acute distress. Pulmonary.  Lungs clear bilaterally, normal respiratory effort. CV.  Regular rate and rhythm, no JVD, rub or murmur. Abdomen.  Soft, nontender, nondistended, BS positive. CNS.  Alert but not participating in exam or care. Extremities.  No edema, no cyanosis, pulses intact and symmetrical. Psychiatry.  Judgment and insight appears impaired.  DVT prophylaxis: Subcu heparin Code Status: DNR Family Communication: Discussed with daughter-in-law on phone Disposition Plan:  Status is: Inpatient  Remains inpatient appropriate because:Inpatient level of care appropriate due to severity of illness   Dispo: The patient is from: SNF              Anticipated d/c is to: SNF              Patient currently is not medically stable to d/c.    Difficult to place patient No               Level of care: Med-Surg  All the records are reviewed and case discussed with Care Management/Social Worker. Management plans discussed with the patient, nursing and they are in agreement.  Consultants:   PCCM  GI  Palliative care  Psych  Procedures:  Antimicrobials:  Unasyn  Data Reviewed: I have personally reviewed following labs and imaging studies  CBC: Recent Labs  Lab 10/18/20 0150  WBC 5.8  NEUTROABS 3.6  HGB 7.4*  HCT 24.5*  MCV 94.6  PLT 133*   Basic Metabolic Panel: Recent Labs  Lab 10/18/20 0150 10/19/20 0557 10/19/20 1542 10/20/20 0716  NA 142 138  --  140  K 3.1* 4.6  --  4.0  CL 117* 111  --  113*  CO2 12* 17*  --  17*  GLUCOSE 66* 117*  --  106*  BUN 54* 60*  --  58*  CREATININE 2.37* 3.31*  --  3.24*  CALCIUM 6.7* 8.2*  --  8.2*  MG  --  1.2* 2.5*  --   PHOS  --  3.7  --  3.5   GFR: Estimated Creatinine Clearance: 10.9 mL/min (A) (by C-G formula based on SCr of 3.24 mg/dL (H)). Liver Function Tests: Recent Labs  Lab 10/18/20 0150 10/19/20 1542 10/20/20 0716  AST 18 14* 14*  ALT 7 9 8   ALKPHOS 31* 33* 29*  BILITOT 1.0 0.8 0.7  PROT 4.1* 4.8* 4.6*  ALBUMIN 2.0* 2.3* 2.2*   Recent Labs  Lab 10/18/20 0440  LIPASE 24   Recent Labs  Lab 10/18/20 0150  AMMONIA 26   Coagulation Profile: No results for input(s): INR, PROTIME in the last 168 hours. Cardiac Enzymes: No results for input(s): CKTOTAL, CKMB, CKMBINDEX, TROPONINI in the last 168 hours. BNP (last 3 results) No results for input(s): PROBNP in the last 8760 hours. HbA1C: Recent Labs    10/19/20 1542  HGBA1C 6.1*   CBG: Recent Labs  Lab 10/19/20 1918 10/19/20 2312 10/20/20 0415 10/20/20 0726 10/20/20 1117  GLUCAP 135* 117* 105* 91 93   Lipid Profile: No results for input(s): CHOL, HDL, LDLCALC, TRIG, CHOLHDL, LDLDIRECT in the last 72 hours. Thyroid Function Tests: No results for input(s): TSH, T4TOTAL, FREET4,  T3FREE, THYROIDAB in the last 72 hours. Anemia Panel: No results for input(s): VITAMINB12, FOLATE, FERRITIN, TIBC, IRON, RETICCTPCT in the last 72 hours. Sepsis Labs: Recent Labs  Lab 10/18/20 0440 10/18/20 0459 10/18/20 1138 10/18/20 2144 10/19/20 0557 10/20/20 0716  PROCALCITON  --  0.10  --   --  0.65 0.63  LATICACIDVEN 6.9*  --  7.5* 2.3* 1.5  --     Recent Results (from the past 240 hour(s))  Culture, blood (Routine X 2) w Reflex to ID Panel     Status: None (Preliminary result)   Collection Time: 10/18/20  1:50 AM   Specimen: BLOOD  Result Value Ref Range Status   Specimen Description BLOOD BLOOD RIGHT FOREARM  Final   Special Requests   Final    BOTTLES DRAWN AEROBIC AND ANAEROBIC Blood Culture adequate volume   Culture   Final    NO GROWTH 2 DAYS Performed at [facility], [address], [place], [place]    Report Status PENDING  Incomplete  Culture, blood (Routine X 2) w Reflex to ID Panel     Status: None (Preliminary result)   Collection Time: 10/18/20  1:50 AM   Specimen: BLOOD  Result Value Ref Range Status   Specimen Description BLOOD BLOOD RIGHT FOREARM  Final   Special Requests   Final    BOTTLES DRAWN AEROBIC AND ANAEROBIC Blood Culture adequate volume   Culture   Final    NO GROWTH 2 DAYS Performed at [facility], [address], [place], [place]    Report Status PENDING  Incomplete  Urine Culture     Status: None   Collection Time: 10/18/20  1:50 AM   Specimen: Urine, Random  Result Value Ref Range Status   Specimen Description   Final    URINE, RANDOM Performed at [facility], [address], [place], [place]    Special Requests   Final    NONE Performed at [facility], [address], [place], [place]    Culture   Final    NO GROWTH Performed at [facility], [address] [place], [place]    Report Status 10/19/2020 FINAL  Final  Resp  Panel by RT-PCR (  Flu A&B, Covid) Nasopharyngeal Swab     Status: None   Collection Time: 10/18/20  3:04 AM   Specimen: Nasopharyngeal Swab; Nasopharyngeal(NP) swabs in vial transport medium  Result Value Ref Range Status   SARS Coronavirus 2 by RT PCR NEGATIVE NEGATIVE Final    Comment: (NOTE) SARS-CoV-2 target nucleic acids are NOT DETECTED.  The SARS-CoV-2 RNA is generally detectable in upper respiratory specimens during the acute phase of infection. The lowest concentration of SARS-CoV-2 viral copies this assay can detect is 138 copies/mL. A negative result does not preclude SARS-Cov-2 infection and should not be used as the sole basis for treatment or other patient management decisions. A negative result may occur with  improper specimen collection/handling, submission of specimen other than nasopharyngeal swab, presence of viral mutation(s) within the areas targeted by this assay, and inadequate number of viral copies(<138 copies/mL). A negative result must be combined with clinical observations, patient history, and epidemiological information. The expected result is Negative.  Fact Sheet for Patients:  BloggerCourse.com  Fact Sheet for Healthcare Providers:  SeriousBroker.it  This test is no t yet approved or cleared by the [place] FDA and  has been authorized for detection and/or diagnosis of SARS-CoV-2 by FDA under an Emergency Use Authorization (EUA). This EUA will remain  in effect (meaning this test can be used) for the duration of the COVID-19 declaration under Section 564(b)(1) of the Act, 21 U.S.C.section 360bbb-3(b)(1), unless the authorization is terminated  or revoked sooner.       Influenza A by PCR NEGATIVE NEGATIVE Final   Influenza B by PCR NEGATIVE NEGATIVE Final    Comment: (NOTE) The Xpert Xpress SARS-CoV-2/FLU/RSV plus assay is intended as an aid in the diagnosis of influenza from Nasopharyngeal  swab specimens and should not be used as a sole basis for treatment. Nasal washings and aspirates are unacceptable for Xpert Xpress SARS-CoV-2/FLU/RSV testing.  Fact Sheet for Patients: BloggerCourse.com  Fact Sheet for Healthcare Providers: SeriousBroker.it  This test is not yet approved or cleared by the [place] FDA and has been authorized for detection and/or diagnosis of SARS-CoV-2 by FDA under an Emergency Use Authorization (EUA). This EUA will remain in effect (meaning this test can be used) for the duration of the COVID-19 declaration under Section 564(b)(1) of the Act, 21 U.S.C. section 360bbb-3(b)(1), unless the authorization is terminated or revoked.  Performed at [facility], [address], [place], [place] [address]      Radiology Studies: No results found.  Scheduled Meds: . aspirin  81 mg Oral Daily  . atorvastatin  40 mg Oral Daily  . Chlorhexidine Gluconate Cloth  6 each Topical Daily  . cholecalciferol  1,000 Units Oral Daily  . feeding supplement  237 mL Oral BID BM  . heparin  5,000 Units Subcutaneous Q8H  . hydrocortisone sod succinate (SOLU-CORTEF) inj  50 mg Intravenous Daily  . insulin aspart  0-9 Units Subcutaneous Q4H  . iron polysaccharides  150 mg Oral Daily  . mouth rinse  15 mL Mouth Rinse BID  . [START ON 10/21/2020] multivitamin with minerals  1 tablet Oral Daily  . pantoprazole  40 mg Oral BID   Continuous Infusions: . ampicillin-sulbactam (UNASYN) IV Stopped (10/19/20 2341)  . lactated ringers 100 mL/hr at 10/20/20 0902     LOS: 2 days   Time spent: 35 minutes. More than 50% of the time was spent in counseling/coordination of care  Arnetha Courser, MD [facility]  If 7PM-7AM, please contact  night-coverage [email or web address]  10/20/2020, 2:40 PM   This record has been created using Conservation officer, historic buildingsDragon voice recognition software. Errors have been sought and corrected,but may not  always be located. Such creation errors do not reflect on the standard of care.

## 2020-10-20 NOTE — Progress Notes (Signed)
Report called and given to Mia on 2C. Pt does not appear to be in distress and is ready for transport.

## 2020-10-20 NOTE — Progress Notes (Signed)
Speech Language Pathology Treatment: Dysphagia  Patient Details Name: Kim Roy MRN: [id number] DOB: 1931-06-06 Today's Date: 10/20/2020 Time: 9563-8756 SLP Time Calculation (min) (ACUTE ONLY): 35 min  Assessment / Plan / Recommendation Clinical Impression  Pt appears to present w/ oropharyngeal phase dysphagia in light of Significantly declined Cognitive status; unknown Baseline Cognitive status. This presentation impacts her overall awareness/engagement and safety during po tasks which increases risk for aspiration, choking. Pt is at risk for aspiration as well as risk for not meeting nutritional needs fully -- family reported pt has not been eating sufficiently in recent months. At this session today, she required Mod-Max tactile/verbal/ visual cues for orientation to bolus presentation, and during feeding support(she often looked away from boluses presented at mouth). She did not readily accept po trials and required encouragement to take pinched straw boluses of thin liquids placed at corner of mouth(3). No immediate, overt clinical s/s of aspiration noted; no cough or decline in respiratory status during/post trials(O2 97%). She was nonverbal so unable to assess vocal quality. Oral phase c/b closed, pursed lips w/ poor acceptance and engagement w/ boluses. One trial of puree was attempted via TSP w/ pt presenting w/ a closed mouth and was not agreeable to taking further po's. Pt appeared to managed the small boluses via pinched straw adequately, but did not orally respond/engage to want to take more. Did not force po's.    D/t pt's declined Cognitive status and her risk for aspiration, recommend continue the current Dysphagia level 1(puree) w/ Thin liquids when accepting of po's but do NOT force po's. Recommend aspiration precautions; reduce Distractions during meals and only give po's when pt is calm and participative. Pills Crushed in Puree for safer swallowing IF accepting or via IV  route. Support w/ feeding at meals -- check for oral clearing during/post intake. NSG updated. ST services recommends follow w/ Palliative Care for GOC as she has not been taking po's per NSG report since admit, and prior. ST services can follow pt at discharge IF pt becomes ready for any upgrade of diet in her future. Largely suspect that pt's Cognitive decline and behaviors could continue to hamper oral intake and upgrade of diet. Precautions posted in room. NSG and MD updated. Palliative Care following.     HPI HPI: Per admitting H&P "87 year old female admitted with aspiration pneumonia & choledocholithiasis in septic shock requiring vasopressors admitted to ICU. History of Present Illness:  87 year old female arrived at the ED via EMS from [facility] due to complaints of hypotension and altered mental status.  SNF staff reported to EMS that the patient was found less responsive than normal, BP 60/30 was obtained in the field and improved with IV fluids. It is unclear when the patient's LKW time was, SNF staff were unsure".  Pt has not been eating well prior to admit per report.       SLP Plan  Consult other service (comment);Continue with current plan of care (Palliative Care for GOC)       Recommendations  Diet recommendations: Dysphagia 1 (puree);Thin liquid Liquids provided via: Teaspoon;Cup Medication Administration: Crushed with puree (for safer swallowing if she accepts boluses, engages) Supervision: Staff to assist with self feeding;Full supervision/cueing for compensatory strategies Compensations: Minimize environmental distractions;Slow rate;Small sips/bites;Lingual sweep for clearance of pocketing;Multiple dry swallows after each bite/sip;Follow solids with liquid Postural Changes and/or Swallow Maneuvers: Seated upright 90 degrees;Upright 30-60 min after meal  General recommendations:  (Palliative care; Dietician) Oral Care Recommendations: Oral care  BID;Oral care before and after PO;Staff/trained caregiver to provide oral care Follow up Recommendations: Skilled Nursing facility (TBD) SLP Visit Diagnosis: Dysphagia, oropharyngeal phase (R13.12) Plan: Consult other service (comment);Continue with current plan of care (Palliative Care for GOC)       GO                 Jerilynn Som, MS, CCC-SLP Speech Language Pathologist Rehab Services [phone] [facility] 10/20/2020, 3:33 PM

## 2020-10-20 NOTE — Consult Note (Signed)
Consultation Note Date: 10/20/2020   Patient Name: Kim Roy  DOB: 03-24-1931  MRN: [id number]  Age / Sex: 87 y.o., female  PCP: [facility] Referring Physician: Lorella Nimrod, MD  Reason for Consultation: Establishing goals of care  HPI/Patient Profile: 87 y.o. female  with past medical history of DM, HTN, dyslipidemia, CKD stage IIIa admitted on 10/18/2020 from [facility] with hypotension and AMS. Hospital admission for severe sepsis along with septic shock secondary to aspiration pneumonia initially requiring ICU admission for vasopressor support. CT abdomen reveals choledocholithiasis. GI following. No signs of cholangitis or obstruction, therefore recommending outpatient GI f/u for possible ERCP once sepsis resolved. 3/1 off pressors, vitals stable but patient refusing medications and food. Repeat CT head 3/1. Palliative medicine consultation for goals of care.    Clinical Assessment and Goals of Care:  I have reviewed medical records, discussed with Dr. Reesa Chew and RN, and met with patient's DIL Kim Roy) at bedside to discuss goals of care. Patient will open eyes to voice, otherwise she is non-verbal and refusing medications and food/drink from staff and Kim Roy at bedside. She will not follow commands. She does not appear to be in pain or discomfort.   I introduced Palliative Medicine as specialized medical care for people living with serious illness. It focuses on providing relief from the symptoms and stress of a serious illness. The goal is to improve quality of life for both the patient and the family.  We discussed a brief life review of the patient. 5 sons. She has lived at [facility] for approximately 2 years following hospitalization for a fall. Kim Roy reports a decline in functional and nutritional status since last fall 2021. No diagnosis of dementia  but family reports some mild cognitive impairment and forgetfulness.   Discussed course of hospitalization including diagnoses, interventions, plan of care. Kim Roy is very concerned about Ms. Kim Roy's decrease in mental status today compared to when Kim Roy was with her in the ER. Plan is for repeat stat CT head this afternoon.   Discussed results of previous CT head, indicating suspicion for underlying dementia. Discussed dementia disease trajectory and concerns with possible progression from acute infection as well as being outside of patient's normal environment/hospital delirium.   I attempted to elicit values and goals of care important to the patient and family. Per chart review, Kim Roy and son Kim Roy are documented POA's. Kim Roy reports that Ms. Kim Roy does NOT have healthcare POA (likely just durable) therefore 5 sons should be involved in medical decision making. Kim Roy confirms decision for DNR code status, sharing the sons agree.   Kim Roy asks where do we go from here if Ms. Kim Roy will not eat. Expressed concern with this and decision regarding feeding tube/artificial nutrition vs. Comfort pathway and comfort feeds. Explained that Kim Roy certainly needs assist and encouragement with meals. Discussed dysphagia diet and aspiration precautions.   Kim Roy shares belief that majority of sons will opt against feeding tube placement but does share that one son, Kim Roy has  spoken of his desire to place feeding tube if necessary in order to prolong life. Kim Roy and I discussed quality of life vs. Quantity aspects and risks with placing feeding tube. Also this likely not impacting her quality of life with reported decline in the last few months (likely some dementia/failure to thrive). Kim Roy does seem to understand this and although Ms. Kim Roy has not shared her wishes regarding feeding tubes with the family, Kim Roy does not believe Ms. Kim Roy would wish to prolong her life in this current state. Kim Roy  shares that Kim Roy previously spoke on wishes against dialysis if ever needed.   Expressed importance of having goals of care discussion with all 5 sons if possible. PMT contact information given and Kim Roy will coordinate this evening with hopes of [place] meeting tomorrow 3/2 afternoon. Kim Roy will call this NP with a good time for family meeting.   SUMMARY OF RECOMMENDATIONS    DNR, otherwise continue current plan of care and medical management.   Repeat CT head this afternoon.   Continue PT/OT/SLP efforts. Patient needs assist and encouragement with meals.   Per DIL, patient does not have a documented living will or HCPOA. Important for 5 sons to be involved in medical decision making. DIL Kim Roy to coordinate with sons and understands importance of [place] meeting. PMT provider will wait to hear back from DIL for meeting time. Hopeful for 3/2 [place] meeting with family.   Code Status/Advance Care Planning:  DNR  Symptom Management:   Per attending  Palliative Prophylaxis:   Aspiration, Bowel Regimen, Delirium Protocol, Frequent Pain Assessment, Oral Care and Turn Reposition   Psycho-social/Spiritual:   Desire for further Chaplaincy support: yes  Additional Recommendations: Caregiving  Support/Resources, Compassionate Wean Education and Education on Hospice  Prognosis:   Poor long-term prognosis  Discharge Planning: To Be Determined      Primary Diagnoses: Present on Admission: . Type II diabetes mellitus with renal manifestations (Kim Roy)   I have reviewed the medical record, interviewed the patient and family, and examined the patient. The following aspects are pertinent.  Past Medical History:  Diagnosis Date  . Anemia in CKD (chronic kidney disease)   . Bronchitis   . Coronary artery disease   . Diabetes mellitus without complication ([place])   . Gout   . HTN (hypertension)   . Hyperlipemia   . Metabolic encephalopathy   . Thrombocytopenia ([place])   . TIA (transient  ischemic attack)   . Vitamin D deficiency    Social History   Socioeconomic History  . Marital status: Single    Spouse name: Not on file  . Number of children: Not on file  . Years of education: Not on file  . Highest education level: Not on file  Occupational History  . Not on file  Tobacco Use  . Smoking status: Never Smoker  . Smokeless tobacco: Current User    Types: Snuff  Substance and Sexual Activity  . Alcohol use: No  . Drug use: No  . Sexual activity: Not on file  Other Topics Concern  . Not on file  Social History Narrative  . Not on file   Social Determinants of Health   Financial Resource Strain: Not on file  Food Insecurity: Not on file  Transportation Needs: Not on file  Physical Activity: Not on file  Stress: Not on file  Social Connections: Not on file   History reviewed. No pertinent family history. Scheduled Meds: . aspirin  81 mg Oral Daily  .  atorvastatin  40 mg Oral Daily  . Chlorhexidine Gluconate Cloth  6 each Topical Daily  . cholecalciferol  1,000 Units Oral Daily  . feeding supplement  237 mL Oral BID BM  . heparin  5,000 Units Subcutaneous Q8H  . hydrocortisone sod succinate (SOLU-CORTEF) inj  50 mg Intravenous Daily  . insulin aspart  0-9 Units Subcutaneous Q4H  . iron polysaccharides  150 mg Oral Daily  . mouth rinse  15 mL Mouth Rinse BID  . [START ON 10/21/2020] multivitamin with minerals  1 tablet Oral Daily  . pantoprazole  40 mg Oral BID   Continuous Infusions: . ampicillin-sulbactam (UNASYN) IV Stopped (10/19/20 2341)  . lactated ringers 100 mL/hr at 10/20/20 0902   PRN Meds:.docusate sodium, polyethylene glycol Medications Prior to Admission:  Prior to Admission medications   Medication Sig Start Date End Date Taking? Authorizing Provider  acetaminophen (TYLENOL) 325 MG tablet Take 2 tablets (650 mg total) by mouth every 6 (six) hours as needed for mild pain (or Fever >/= 101). 01/23/19  Yes Dustin Flock, MD  aspirin EC  81 MG tablet Take 81 mg by mouth daily. Swallow whole.   Yes [provider]  atorvastatin (LIPITOR) 40 MG tablet Take 40 mg by mouth daily. 01/12/19  Yes [provider]  carvedilol (COREG) 12.5 MG tablet Take 12.5 mg by mouth 2 (two) times a day. 04/20/15  Yes [provider]  Cholecalciferol 25 MCG (1000 UT) tablet Take 1,000 Units by mouth daily.   Yes [provider]  iron polysaccharides (NIFEREX) 150 MG capsule Take 1 capsule (150 mg total) by mouth daily. 05/04/20  Yes Sheikh, Omair Latif, DO  lisinopril-hydrochlorothiazide (ZESTORETIC) 10-12.5 MG tablet Take 1 tablet by mouth daily. 05/04/20 05/04/21 Yes Sheikh, Omair Latif, DO  metFORMIN (GLUCOPHAGE) 500 MG tablet Take 500 mg by mouth 2 (two) times daily with a meal. 04/07/15  Yes [provider]  mirtazapine (REMERON) 7.5 MG tablet Take 7.5 mg by mouth at bedtime. 10/14/20  Yes [provider]  pantoprazole (PROTONIX) 40 MG tablet Take 40 mg by mouth 2 (two) times daily. 09/23/20  Yes [provider]  senna (SENOKOT) 8.6 MG tablet Take 2 tablets by mouth 2 (two) times daily.   Yes [provider]  amLODipine (NORVASC) 10 MG tablet Take 10 mg by mouth daily. Patient not taking: Reported on 10/18/2020    [provider]  aspirin EC 325 MG EC tablet Take 1 tablet (325 mg total) by mouth daily with breakfast. Patient not taking: Reported on 10/18/2020 01/23/19   Duanne Guess, PA-C  cetirizine (ZYRTEC) 10 MG tablet Take 10 mg by mouth daily. Patient not taking: No sig reported    [provider]  docusate sodium (COLACE) 100 MG capsule Take 1 capsule (100 mg total) by mouth 2 (two) times daily. Patient not taking: Reported on 05/01/2020 01/23/19   Dustin Flock, MD  feeding supplement, ENSURE ENLIVE, (ENSURE ENLIVE) LIQD Take 237 mLs by mouth daily. 05/04/20   Sheikh, Omair Latif, DO  ondansetron (ZOFRAN) 4 MG tablet Take 1 tablet (4 mg total) by mouth every 6  (six) hours as needed for nausea. Patient not taking: No sig reported 05/04/20   Raiford Noble Latif, DO   No Known Allergies Review of Systems  Unable to perform ROS: Mental status change   Physical Exam Vitals and nursing note reviewed.  Constitutional:      Appearance: She is ill-appearing.  Cardiovascular:     Rate  and Rhythm: Normal rate.  Pulmonary:     Effort: No tachypnea, accessory muscle usage or respiratory distress.     Comments: Stable on room air Skin:    General: Skin is warm and dry.  Neurological:     Mental Status: She is easily aroused.     Comments: Opens eyes to voice, does not follow commands, non-verbal during visit  Psychiatric:        Attention and Perception: She is inattentive.        Speech: She is noncommunicative.        Cognition and Memory: Cognition is impaired.    Vital Signs: BP 133/60   Pulse 68   Temp 98.06 F (36.7 C)   Resp 17   Ht '5\' 5"'  (1.651 m)   Wt 56.7 kg   SpO2 98%   BMI 20.80 kg/m  Pain Scale: PAINAD   Pain Score: 0-No pain   SpO2: SpO2: 98 % O2 Device:SpO2: 98 % O2 Flow Rate: .   IO: Intake/output summary:   Intake/Output Summary (Last 24 hours) at 10/20/2020 1547 Last data filed at 10/20/2020 0600 Gross per 24 hour  Intake 1104.26 ml  Output 600 ml  Net 504.26 ml    LBM: Last BM Date: 10/18/20 Baseline Weight: Weight: 56.7 kg Most recent weight: Weight: 56.7 kg     Palliative Assessment/Data: PPS 20%   Flowsheet Rows   Flowsheet Row Most Recent Value  Intake Tab   Referral Department Hospitalist  Unit at Time of Referral ICU  Palliative Care Primary Diagnosis Other (Comment)  Palliative Care Type New Palliative care  Reason for referral Clarify Goals of Care  Date first seen by Palliative Care 10/20/20  Clinical Assessment   Palliative Performance Scale Score 20%  Psychosocial & Spiritual Assessment   Palliative Care Outcomes   Patient/Family meeting held? Yes  Who was at the meeting? DIL Kim Roy)   Palliative Care Outcomes Clarified goals of care, Counseled regarding hospice, Provided psychosocial or spiritual support, ACP counseling assistance       Time Total: 70 Greater than 50%  of this time was spent counseling and coordinating care related to the above assessment and plan.  Signed by:  Ihor Dow, DNP, FNP-C Palliative Medicine Team  Phone: [phone] Fax: [phone]  Please contact Palliative Medicine Team phone at [phone] for questions and concerns.  For individual provider: See Shea Evans

## 2020-10-20 NOTE — Progress Notes (Addendum)
Neuro: Patient refusing to answer questions or speak, UTA neuro status, will turn in bed to change linen but does not maintain Q2hr turns; selectively follows simple commands Resp: stable on room air CV: afebrile, vital signs stable GIGU: refuses to open mouth for oral care/to take meds, voiding per purewick, no BM Skin: very small injury on sacrum, covered with foam, otherwise intact-RN attempts to turn patient Q 2 hours but the patient moves back to supine position Social: no contact with family

## 2020-10-20 NOTE — Progress Notes (Signed)
Initial Nutrition Assessment  DOCUMENTATION CODES:   Severe malnutrition in context of chronic illness  INTERVENTION:  Provide Ensure Enlive po BID, each supplement provides 350 kcal and 20 grams of protein.  Provide Magic cup TID with meals, each supplement provides 290 kcal and 9 grams of protein.  Provide MVI po daily.  Will monitor outcome of discussions regarding goals of care.  NUTRITION DIAGNOSIS:   Severe Malnutrition related to chronic illness (CKD, suspected inadequate oral intake) as evidenced by severe fat depletion,moderate muscle depletion,severe muscle depletion.  GOAL:   Patient will meet greater than or equal to 90% of their needs  MONITOR:   PO intake,Supplement acceptance,Labs,Weight trends,Skin,I & O's  REASON FOR ASSESSMENT:   Malnutrition Screening Tool,Consult Assessment of nutrition requirement/status  ASSESSMENT:   87 year old female with PMHx of DM, HTN, gout, CAD, vitamin D deficiency admitted with severe sepsis with septic shock due to aspiration PNA, choledocholithiasis, AKI on CKD stage III.   2/28 diet advanced to dysphagia 1 with thin liquids following SLP evaluation  Met with patient at bedside. She was alert but unable to answer any questions or provide any history. Per RN patient refusing PO intake or PO medications today. Pending palliative medicine consult.  Discussed with patient's POA Delicia Berens (daughter-in-law) over the phone. Patient is from [employer]. She reports that patient has not been eating well lately at SNF. She reports she is unsure exactly how long this has been going on - possibly a few weeks to a few months. She reports patient will only eat small bites of food. She does enjoy Ensure but will also only take a few sips. She reports patient has been losing weight over time but is unsure of UBW or weight trend.   Per review of weight history in chart patient documented to be 60.8 kg on 01/19/2019, 56.5 kg on  05/04/2020, and is now documented to be 56.7 kg (125 lbs) but unsure if this is a true measured weight.  Medications reviewed and include: vitamin D 1000 units daily, Solu-Cortef 50 mg daily IV, Novolog 0-9 units Q4hrs, Protonix, Unasyn, LR at 100 mL/hr.  Labs reviewed: CBG 91-117, Chloride 113, CO2 17, BUN 58, Creatinine 3.24.  NUTRITION - FOCUSED PHYSICAL EXAM:  Flowsheet Row Most Recent Value  Orbital Region Severe depletion  Upper Arm Region Moderate depletion  Thoracic and Lumbar Region Severe depletion  Buccal Region Severe depletion  Temple Region Severe depletion  Clavicle Bone Region Severe depletion  Clavicle and Acromion Bone Region Severe depletion  Scapular Bone Region Unable to assess  Dorsal Hand Moderate depletion  Patellar Region Severe depletion  Anterior Thigh Region Severe depletion  Posterior Calf Region Severe depletion  Edema (RD Assessment) None  Hair Reviewed  Eyes Unable to assess  Mouth Unable to assess  Skin Reviewed  Nails Reviewed     Diet Order:   Diet Order            DIET - DYS 1 Room service appropriate? Yes; Fluid consistency: Thin  Diet effective now                EDUCATION NEEDS:   No education needs have been identified at this time  Skin:  Skin Assessment: Skin Integrity Issues: Skin Integrity Issues:: Stage II Stage II: buttocks (2cm x 0.2cm)  Last BM:  10/18/2020 per chart  Height:   Ht Readings from Last 1 Encounters:  10/18/20 5\' 5"  (1.651 m)   Weight:   Wt Readings  from Last 1 Encounters:  10/18/20 56.7 kg   BMI:  Body mass index is 20.8 kg/m.  Estimated Nutritional Needs:   Kcal:  1450-1650  Protein:  75-85 grams  Fluid:  1.4-1.6 L/day  Jacklynn Barnacle, MS, RD, LDN Pager number available on Amion

## 2020-10-20 NOTE — Consult Note (Signed)
Consult received. PAtient will be seen priority in the morning

## 2020-10-21 DIAGNOSIS — Z7189 Other specified counseling: Secondary | ICD-10-CM | POA: Diagnosis not present

## 2020-10-21 DIAGNOSIS — K8050 Calculus of bile duct without cholangitis or cholecystitis without obstruction: Secondary | ICD-10-CM | POA: Diagnosis not present

## 2020-10-21 DIAGNOSIS — R4182 Altered mental status, unspecified: Secondary | ICD-10-CM | POA: Diagnosis not present

## 2020-10-21 DIAGNOSIS — J690 Pneumonitis due to inhalation of food and vomit: Secondary | ICD-10-CM | POA: Diagnosis not present

## 2020-10-21 DIAGNOSIS — R41840 Attention and concentration deficit: Secondary | ICD-10-CM

## 2020-10-21 LAB — CBC
HCT: 29 % — ABNORMAL LOW (ref 36.0–46.0)
Hemoglobin: 9.4 g/dL — ABNORMAL LOW (ref 12.0–15.0)
MCH: 28.8 pg (ref 26.0–34.0)
MCHC: 32.4 g/dL (ref 30.0–36.0)
MCV: 89 fL (ref 80.0–100.0)
Platelets: 141 10*3/uL — ABNORMAL LOW (ref 150–400)
RBC: 3.26 MIL/uL — ABNORMAL LOW (ref 3.87–5.11)
RDW: 15.1 % (ref 11.5–15.5)
WBC: 3.9 10*3/uL — ABNORMAL LOW (ref 4.0–10.5)
nRBC: 0 % (ref 0.0–0.2)

## 2020-10-21 LAB — GLUCOSE, CAPILLARY
Glucose-Capillary: 106 mg/dL — ABNORMAL HIGH (ref 70–99)
Glucose-Capillary: 108 mg/dL — ABNORMAL HIGH (ref 70–99)
Glucose-Capillary: 80 mg/dL (ref 70–99)
Glucose-Capillary: 81 mg/dL (ref 70–99)
Glucose-Capillary: 82 mg/dL (ref 70–99)
Glucose-Capillary: 89 mg/dL (ref 70–99)
Glucose-Capillary: 94 mg/dL (ref 70–99)
Glucose-Capillary: 95 mg/dL (ref 70–99)

## 2020-10-21 LAB — COMPREHENSIVE METABOLIC PANEL
ALT: 8 U/L (ref 0–44)
AST: 16 U/L (ref 15–41)
Albumin: 2.1 g/dL — ABNORMAL LOW (ref 3.5–5.0)
Alkaline Phosphatase: 32 U/L — ABNORMAL LOW (ref 38–126)
Anion gap: 9 (ref 5–15)
BUN: 44 mg/dL — ABNORMAL HIGH (ref 8–23)
CO2: 18 mmol/L — ABNORMAL LOW (ref 22–32)
Calcium: 8.3 mg/dL — ABNORMAL LOW (ref 8.9–10.3)
Chloride: 116 mmol/L — ABNORMAL HIGH (ref 98–111)
Creatinine, Ser: 2.33 mg/dL — ABNORMAL HIGH (ref 0.44–1.00)
GFR, Estimated: 20 mL/min — ABNORMAL LOW (ref >60)
Glucose, Bld: 91 mg/dL (ref 70–99)
Potassium: 3.8 mmol/L (ref 3.5–5.1)
Sodium: 143 mmol/L (ref 135–145)
Total Bilirubin: 0.9 mg/dL (ref 0.3–1.2)
Total Protein: 4.5 g/dL — ABNORMAL LOW (ref 6.5–8.1)

## 2020-10-21 LAB — VITAMIN B12: Vitamin B-12: 302 pg/mL (ref 180–914)

## 2020-10-21 LAB — TSH: TSH: 1.145 u[IU]/mL (ref 0.350–4.500)

## 2020-10-21 LAB — AMMONIA: Ammonia: 11 umol/L (ref 9–35)

## 2020-10-21 MED ORDER — SODIUM CHLORIDE 0.9 % IV SOLN
3.0000 g | Freq: Two times a day (BID) | INTRAVENOUS | Status: DC
  Administered 2020-10-21 – 2020-10-25 (×7): 3 g via INTRAVENOUS
  Filled 2020-10-21 (×2): qty 8
  Filled 2020-10-21: qty 3
  Filled 2020-10-21 (×2): qty 8
  Filled 2020-10-21: qty 3
  Filled 2020-10-21 (×2): qty 8
  Filled 2020-10-21: qty 3

## 2020-10-21 MED ORDER — SODIUM CHLORIDE 0.9 % IV SOLN: INTRAVENOUS | Status: DC | PRN

## 2020-10-21 MED ORDER — CARVEDILOL 6.25 MG PO TABS
6.2500 mg | ORAL_TABLET | Freq: Two times a day (BID) | ORAL | Status: DC
  Administered 2020-10-25 – 2020-10-28 (×4): 6.25 mg via ORAL
  Filled 2020-10-21 (×7): qty 1

## 2020-10-21 NOTE — Progress Notes (Signed)
Patient non-verbal, but makes eye contact. Patient would not open mouth to take a drink of her juice. Patient would not take oral medications.   Madie Reno, RN

## 2020-10-21 NOTE — Progress Notes (Signed)
[facility] PROGRESS NOTE    Goddess Gebbia  YKZ:[id number] DOB: 04/17/1931 DOA: 10/18/2020 PCP: [facility]      Brief Narrative:  Mrs. Barsanti is a 87 y.o. F with dementia, living in SNF, DM, HTN, and CKD IIIa who presented from [facility] with hypotension and decreased mentation.  In the ER, BP 60/30 and started on Fluids.  Found to have Lactate 7.5, leukocytosis, hypothermia and started on pressors and antibiotics.  CT of the chest and abdomen showed aspiration pneumonia and possible choledocholithiasis.    Interval history: Weaned off pressors GI consulted, felt that she had no signs of cholangitis, recommended outpatient follow up. Patient with persistent decreased oral intake, decreased activity after resolution of pneumonia Palliative care consulted    Assessment & Plan:  Septic shock due to aspiration pneumonia -Continue Unasyn, day 5 of antibiotics -Stop HC  Acute metabolic encephalopathy superimposed on dementia The differential for this includes encephalopathy from sepsis superimposed on dementia vs encephalopathy from underlying medical condition (thyroid, liver failure, etc).  CT head shows atrophy, no bleeding or stroke; given the time course of her mental status changes, I feel this is sensitive to rule out stroke.    -Check TSH, ammonia, RPR, B12  -Consult palliative care  -See plan in disposition below    Acute kidney injury on CKD stage IIIa Creatinine peaked at 3.3, improved to 2.3 today -Continue IV fluids -Trend BMP  Metabolic acidosis ImProving with fluids  Hypertension Blood pressure elevated -Resume home carvedilol -Stop stress dose steorids -Hold home lisinopril, HCTZ  Diabetes Glucose controlled -Continue sliding scale correction insulin -Continue aspirin, statin  Moderate protein calorie malnutrition Reduced muscle mass and fat.  Poor oral intake.  Chronic  disease.  Stage II buttocks ulcer, present on admission  Anemia of CKD -Continue iron       Disposition: Status is: Inpatient  Remains inpatient appropriate because:Altered mental status   Dispo: The patient is from: SNF              Anticipated d/c is to: SNF              Patient currently is not medically stable to d/c.   Difficult to place patient No    Patient was admitted with septic shock.  Her shock has now resolved, but she remains unable to take anything by mouth and with decreased mentation.  Neuraxial imaging rules out hemorrhage or stroke.  I discussed with son Alinda Money if she has no underlying reversible metabolic disorder (like hypothyroidism, liver disease, B12 def or syphilis) and has continued poor PO intake, my medical opinion is that given her advanced age and her underlying dementia (which was moderate to advanced prior to this illness), she does not have the functional and cognitive reserve to recover from this septic shock with multiorgan failure.    Palliative will have discussion with family tomorrow, pending that discussion, if there are no reversible causes found, I would recommend Hospice to enroll patient back to LTC at [facility].      Level of care: Med-Surg       MDM: The below labs and imaging reports were reviewed and summarized above.  Medication management as above.  This is a severe ilness with threat to life.    DVT prophylaxis: heparin injection 5,000 Units Start: 10/18/20 0600 SCDs Start: 10/18/20 0437  Code Status: DNR Family Communication: Son Alinda Money  Subjective: No fever.  Patient has eaten nothing today.  She vocalizes no complaints.  Nursing report no diarrhea, respiratory distress, agitation.  Objective: Vitals:   10/21/20 0452 10/21/20 0500 10/21/20 0925 10/21/20 1211  BP: (!) 154/74  (!) 152/101 (!) 105/56  Pulse: 65  69 66  Resp: 16  15 15   Temp: 97.9 F (36.6 C)  97.7 F (36.5 C)    TempSrc: Oral  Axillary   SpO2: 100%  100% 100%  Weight:  62.6 kg    Height:        Intake/Output Summary (Last 24 hours) at 10/21/2020 1544 Last data filed at 10/21/2020 1254 Gross per 24 hour  Intake 1125.98 ml  Output 1500 ml  Net -374.02 ml   Filed Weights   10/18/20 0138 10/21/20 0500  Weight: 56.7 kg 62.6 kg    Examination: General appearance: An elderly adult female, awake but in no acute distress.   HEENT: Anicteric, conjunctiva pink, lids and lashes normal. No nasal deformity, discharge, epistaxis.  Lips moist.   Skin: Warm and dry.  no jaundice.  No suspicious rashes or lesions. Cardiac: RRR, nl S1-S2, no murmurs appreciated.  Capillary refill is brisk.  no LE edema.  Radial  pulses 2+ and symmetric. Respiratory: Normal respiratory rate and rhythm.  CTAB without rales or wheezes. Abdomen: Abdomen soft.  No grimace to palpation or guarding No ascites, distension, hepatosplenomegaly.   MSK: No deformities or effusions.  Diffuse loss of subcutaneous muscle mass and fat. Neuro: Awake and makes eye contact sometimes, but makes no spontaneous movements, no verbalizations.  Strength appears to be generally weak, but she does not follow commands, she resists movement. Psych: Appears sedated    Data Reviewed: I have personally reviewed following labs and imaging studies:  CBC: Recent Labs  Lab 10/18/20 0150 10/21/20 0443  WBC 5.8 3.9*  NEUTROABS 3.6  --   HGB 7.4* 9.4*  HCT 24.5* 29.0*  MCV 94.6 89.0  PLT 133* 141*   Basic Metabolic Panel: Recent Labs  Lab 10/18/20 0150 10/19/20 0557 10/19/20 1542 10/20/20 0716 10/21/20 0443  NA 142 138  --  140 143  K 3.1* 4.6  --  4.0 3.8  CL 117* 111  --  113* 116*  CO2 12* 17*  --  17* 18*  GLUCOSE 66* 117*  --  106* 91  BUN 54* 60*  --  58* 44*  CREATININE 2.37* 3.31*  --  3.24* 2.33*  CALCIUM 6.7* 8.2*  --  8.2* 8.3*  MG  --  1.2* 2.5*  --   --   PHOS  --  3.7  --  3.5  --    GFR: Estimated Creatinine Clearance:  15.3 mL/min (A) (by C-G formula based on SCr of 2.33 mg/dL (H)). Liver Function Tests: Recent Labs  Lab 10/18/20 0150 10/19/20 1542 10/20/20 0716 10/21/20 0443  AST 18 14* 14* 16  ALT 7 9 8 8   ALKPHOS 31* 33* 29* 32*  BILITOT 1.0 0.8 0.7 0.9  PROT 4.1* 4.8* 4.6* 4.5*  ALBUMIN 2.0* 2.3* 2.2* 2.1*   Recent Labs  Lab 10/18/20 0440  LIPASE 24   Recent Labs  Lab 10/18/20 0150 10/21/20 1054  AMMONIA 26 11   Coagulation Profile: No results for input(s): INR, PROTIME in the last 168 hours. Cardiac Enzymes: No results for input(s): CKTOTAL, CKMB, CKMBINDEX, TROPONINI in the last 168 hours. BNP (last 3 results) No results for input(s): PROBNP in the last 8760 hours. HbA1C: Recent Labs  10/19/20 1542  HGBA1C 6.1*   CBG: Recent Labs  Lab 10/20/20 2048 10/21/20 0030 10/21/20 0447 10/21/20 0738 10/21/20 1215  GLUCAP 106* 94 81 82 95   Lipid Profile: No results for input(s): CHOL, HDL, LDLCALC, TRIG, CHOLHDL, LDLDIRECT in the last 72 hours. Thyroid Function Tests: Recent Labs    10/21/20 1054  TSH 1.145   Anemia Panel: No results for input(s): VITAMINB12, FOLATE, FERRITIN, TIBC, IRON, RETICCTPCT in the last 72 hours. Urine analysis:    Component Value Date/Time   COLORURINE AMBER (A) 10/18/2020 0150   APPEARANCEUR CLOUDY (A) 10/18/2020 0150   LABSPEC 1.025 10/18/2020 0150   PHURINE 5.0 10/18/2020 0150   GLUCOSEU 50 (A) 10/18/2020 0150   HGBUR NEGATIVE 10/18/2020 0150   BILIRUBINUR NEGATIVE 10/18/2020 0150   KETONESUR NEGATIVE 10/18/2020 0150   PROTEINUR >=300 (A) 10/18/2020 0150   NITRITE NEGATIVE 10/18/2020 0150   LEUKOCYTESUR NEGATIVE 10/18/2020 0150   Sepsis Labs: @LABRCNTIP (procalcitonin:4,lacticacidven:4)  ) Recent Results (from the past 240 hour(s))  Culture, blood (Routine X 2) w Reflex to ID Panel     Status: None (Preliminary result)   Collection Time: 10/18/20  1:50 AM   Specimen: BLOOD  Result Value Ref Range Status   Specimen  Description BLOOD BLOOD RIGHT FOREARM  Final   Special Requests   Final    BOTTLES DRAWN AEROBIC AND ANAEROBIC Blood Culture adequate volume   Culture   Final    NO GROWTH 3 DAYS Performed at [facility], [address], [place], [place]    Report Status PENDING  Incomplete  Culture, blood (Routine X 2) w Reflex to ID Panel     Status: None (Preliminary result)   Collection Time: 10/18/20  1:50 AM   Specimen: BLOOD  Result Value Ref Range Status   Specimen Description BLOOD BLOOD RIGHT FOREARM  Final   Special Requests   Final    BOTTLES DRAWN AEROBIC AND ANAEROBIC Blood Culture adequate volume   Culture   Final    NO GROWTH 3 DAYS Performed at [facility], [address], [place], [place]    Report Status PENDING  Incomplete  Urine Culture     Status: None   Collection Time: 10/18/20  1:50 AM   Specimen: Urine, Random  Result Value Ref Range Status   Specimen Description   Final    URINE, RANDOM Performed at [facility], [address], [place], [place]    Special Requests   Final    NONE Performed at [facility], [address], [place], [place]    Culture   Final    NO GROWTH Performed at [facility], [address] [place], [place]    Report Status 10/19/2020 FINAL  Final  Resp Panel by RT-PCR (Flu A&B, Covid) Nasopharyngeal Swab     Status: None   Collection Time: 10/18/20  3:04 AM   Specimen: Nasopharyngeal Swab; Nasopharyngeal(NP) swabs in vial transport medium  Result Value Ref Range Status   SARS Coronavirus 2 by RT PCR NEGATIVE NEGATIVE Final    Comment: (NOTE) SARS-CoV-2 target nucleic acids are NOT DETECTED.  The SARS-CoV-2 RNA is generally detectable in upper respiratory specimens during the acute phase of infection. The lowest concentration of SARS-CoV-2 viral copies this assay can detect is 138 copies/mL. A negative result does not preclude  SARS-Cov-2 infection and should not be used as the sole basis for treatment or other patient management  decisions. A negative result may occur with  improper specimen collection/handling, submission of specimen other than nasopharyngeal swab, presence of viral mutation(s) within the areas targeted by this assay, and inadequate number of viral copies(<138 copies/mL). A negative result must be combined with clinical observations, patient history, and epidemiological information. The expected result is Negative.  Fact Sheet for Patients:  BloggerCourse.comhttps://www.fda.gov/media/152166/download  Fact Sheet for Healthcare Providers:  SeriousBroker.ithttps://www.fda.gov/media/152162/download  This test is no t yet approved or cleared by the [place] FDA and  has been authorized for detection and/or diagnosis of SARS-CoV-2 by FDA under an Emergency Use Authorization (EUA). This EUA will remain  in effect (meaning this test can be used) for the duration of the COVID-19 declaration under Section 564(b)(1) of the Act, 21 U.S.C.section 360bbb-3(b)(1), unless the authorization is terminated  or revoked sooner.       Influenza A by PCR NEGATIVE NEGATIVE Final   Influenza B by PCR NEGATIVE NEGATIVE Final    Comment: (NOTE) The Xpert Xpress SARS-CoV-2/FLU/RSV plus assay is intended as an aid in the diagnosis of influenza from Nasopharyngeal swab specimens and should not be used as a sole basis for treatment. Nasal washings and aspirates are unacceptable for Xpert Xpress SARS-CoV-2/FLU/RSV testing.  Fact Sheet for Patients: BloggerCourse.comhttps://www.fda.gov/media/152166/download  Fact Sheet for Healthcare Providers: SeriousBroker.ithttps://www.fda.gov/media/152162/download  This test is not yet approved or cleared by the [place] FDA and has been authorized for detection and/or diagnosis of SARS-CoV-2 by FDA under an Emergency Use Authorization (EUA). This EUA will remain in effect (meaning this test can be used) for the duration of  the COVID-19 declaration under Section 564(b)(1) of the Act, 21 U.S.C. section 360bbb-3(b)(1), unless the authorization is terminated or revoked.  Performed at [facility], [address] [place], [place] [address]          Radiology Studies: CT HEAD WO CONTRAST  Result Date: 10/20/2020 CLINICAL DATA:  Stroke, follow-up. EXAM: CT HEAD WITHOUT CONTRAST TECHNIQUE: Contiguous axial images were obtained from the base of the skull through the vertex without intravenous contrast. COMPARISON:  Head CT 10/18/2020. Brain MRI 05/01/2020. FINDINGS: Brain: Moderate cerebral and cerebellar atrophy. Redemonstrated chronic lacunar infarcts within the deep gray nuclei. Background advanced ill-defined hypoattenuation within the cerebral white matter is nonspecific, but compatible with chronic small vessel ischemic disease. There is no acute intracranial hemorrhage. No demarcated cortical infarct. No extra-axial fluid collection. No evidence of intracranial mass. No midline shift. Vascular: No hyperdense vessel.  Atherosclerotic calcifications Skull: Normal. Negative for fracture or focal lesion. Sinuses/Orbits: Visualized orbits show no acute finding. Small-volume frothy secretions within the sphenoid sinuses bilaterally. IMPRESSION: No evidence of acute intracranial abnormality. Unchanged non-contrast CT appearance of the brain as compared to 10/18/2020. Chronic lacunar infarcts within the deep gray nuclei. Background advanced chronic small vessel ischemic disease. Moderate cerebral and cerebellar atrophy. Electronically Signed   By: Jackey LogeKyle  Golden DO   On: 10/20/2020 15:18        Scheduled Meds: . aspirin  81 mg Oral Daily  . atorvastatin  40 mg Oral Daily  . cholecalciferol  1,000 Units Oral Daily  . feeding supplement  237 mL Oral BID BM  . heparin  5,000 Units Subcutaneous Q8H  . hydrocortisone sod succinate (SOLU-CORTEF) inj  50 mg Intravenous Daily  . insulin aspart  0-9 Units  Subcutaneous Q4H  . iron polysaccharides  150 mg Oral Daily  . mouth rinse  15 mL Mouth Rinse BID  . multivitamin with minerals  1 tablet Oral  Daily  . pantoprazole  40 mg Oral BID   Continuous Infusions: . ampicillin-sulbactam (UNASYN) IV       LOS: 3 days    Time spent: 35 minutes    Alberteen Sam, MD [facility] 10/21/2020, 3:44 PM     Please page though AMION or Epic secure chat:  For [employer], Higher education careers adviser

## 2020-10-21 NOTE — Plan of Care (Signed)
  Problem: Activity: Goal: Risk for activity intolerance will decrease Outcome: Not Progressing   

## 2020-10-21 NOTE — Consult Note (Signed)
[facility] Face-to-Face Psychiatry Consult   Reason for Consult: Consult for 87 year old woman admitted to the hospital with pneumonia and now unresponsive almost.  Question about possible depression Referring Physician: Danford Patient Identification: Kim Roy MRN:  [id number] Principal Diagnosis: Altered mental status Diagnosis:  Principal Problem:   Altered mental status Active Problems:   Type II diabetes mellitus with renal manifestations (HCC)   Septic shock (HCC)   Aspiration pneumonia of right lung (HCC)   Acute renal failure (HCC)   Hypoglycemia   Choledocholithiasis   Anemia   Pressure injury of skin   Protein-calorie malnutrition, severe   Palliative care by specialist   Goals of care, counseling/discussion   Total Time spent with patient: 45 minutes  Subjective:   Kim Roy is a 87 y.o. female patient admitted with patient not verbal.  HPI: Patient seen chart reviewed.  87 year old woman was admitted to the hospital to the ICU initially with pneumonia.  Now out of the ICU but barely responsive.  Question about possible depression.  Patient was in her room with eyes open.  Moving head back and forth in a rhythmic sort of manner.  Patient did not show any indication of hearing me or responding.  I asked her to do very simple responses such as closing her eyes and she was not able to follow any of those.  No indication I could see that she could hear me at all.  Flat affect.  Looking at the chart she is refusing all food and all medicine currently.  Past Psychiatric History: No documentation of past psychiatric history.  Mirtazapine 7.5 mg was listed in medication list but no hospitalizations listed no other known details of any psychiatric condition.  Without digging for the note I suspect the mirtazapine may have been primarily meant to address weight loss.  Risk to Self:   Risk to Others:   Prior Inpatient Therapy:   Prior Outpatient Therapy:    Past  Medical History:  Past Medical History:  Diagnosis Date  . Anemia in CKD (chronic kidney disease)   . Bronchitis   . Coronary artery disease   . Diabetes mellitus without complication (HCC)   . Gout   . HTN (hypertension)   . Hyperlipemia   . Metabolic encephalopathy   . Thrombocytopenia (HCC)   . TIA (transient ischemic attack)   . Vitamin D deficiency     Past Surgical History:  Procedure Laterality Date  . FRACTURE SURGERY    . HIP PINNING,CANNULATED Left 01/20/2019   Procedure: CANNULATED HIP PINNING;  Surgeon: Kennedy Bucker, MD;  Location: [facility] ORS;  Service: Orthopedics;  Laterality: Left;   Family History: History reviewed. No pertinent family history. Family Psychiatric  History: Nothing known. Social History:  Social History   Substance and Sexual Activity  Alcohol Use No     Social History   Substance and Sexual Activity  Drug Use No    Social History   Socioeconomic History  . Marital status: Single    Spouse name: Not on file  . Number of children: Not on file  . Years of education: Not on file  . Highest education level: Not on file  Occupational History  . Not on file  Tobacco Use  . Smoking status: Never Smoker  . Smokeless tobacco: Current User    Types: Snuff  Substance and Sexual Activity  . Alcohol use: No  . Drug use: No  . Sexual activity: Not on file  Other Topics Concern  .  Not on file  Social History Narrative  . Not on file   Social Determinants of Health   Financial Resource Strain: Not on file  Food Insecurity: Not on file  Transportation Needs: Not on file  Physical Activity: Not on file  Stress: Not on file  Social Connections: Not on file   Additional Social History:    Allergies:  No Known Allergies  Labs:  Results for orders placed or performed during the hospital encounter of 10/18/20 (from the past 48 hour(s))  Glucose, capillary     Status: Abnormal   Collection Time: 10/19/20  7:18 PM  Result Value Ref Range    Glucose-Capillary 135 (H) 70 - 99 mg/dL    Comment: Glucose reference range applies only to samples taken after fasting for at least 8 hours.  Glucose, capillary     Status: Abnormal   Collection Time: 10/19/20 11:12 PM  Result Value Ref Range   Glucose-Capillary 117 (H) 70 - 99 mg/dL    Comment: Glucose reference range applies only to samples taken after fasting for at least 8 hours.  Glucose, capillary     Status: Abnormal   Collection Time: 10/20/20  4:15 AM  Result Value Ref Range   Glucose-Capillary 105 (H) 70 - 99 mg/dL    Comment: Glucose reference range applies only to samples taken after fasting for at least 8 hours.  Procalcitonin     Status: None   Collection Time: 10/20/20  7:16 AM  Result Value Ref Range   Procalcitonin 0.63 ng/mL    Comment:        Interpretation: PCT > 0.5 ng/mL and <= 2 ng/mL: Systemic infection (sepsis) is possible, but other conditions are known to elevate PCT as well. (NOTE)       Sepsis PCT Algorithm           Lower Respiratory Tract                                      Infection PCT Algorithm    ----------------------------     ----------------------------         PCT < 0.25 ng/mL                PCT < 0.10 ng/mL          Strongly encourage             Strongly discourage   discontinuation of antibiotics    initiation of antibiotics    ----------------------------     -----------------------------       PCT 0.25 - 0.50 ng/mL            PCT 0.10 - 0.25 ng/mL               OR       >80% decrease in PCT            Discourage initiation of                                            antibiotics      Encourage discontinuation           of antibiotics    ----------------------------     -----------------------------         PCT >= 0.50 ng/mL  PCT 0.26 - 0.50 ng/mL                AND       <80% decrease in PCT             Encourage initiation of                                             antibiotics       Encourage continuation            of antibiotics    ----------------------------     -----------------------------        PCT >= 0.50 ng/mL                  PCT > 0.50 ng/mL               AND         increase in PCT                  Strongly encourage                                      initiation of antibiotics    Strongly encourage escalation           of antibiotics                                     -----------------------------                                           PCT <= 0.25 ng/mL                                                 OR                                        > 80% decrease in PCT                                      Discontinue / Do not initiate                                             antibiotics  Performed at [facility], [address] [place], [place] [address]   Phosphorus     Status: None   Collection Time: 10/20/20  7:16 AM  Result Value Ref Range   Phosphorus 3.5 2.5 - 4.6 mg/dL    Comment: Performed at [facility], [address] [place], [place] [address]  Comprehensive metabolic panel     Status: Abnormal   Collection Time: 10/20/20  7:16 AM  Result Value Ref  Range   Sodium 140 135 - 145 mmol/L   Potassium 4.0 3.5 - 5.1 mmol/L   Chloride 113 (H) 98 - 111 mmol/L   CO2 17 (L) 22 - 32 mmol/L   Glucose, Bld 106 (H) 70 - 99 mg/dL    Comment: Glucose reference range applies only to samples taken after fasting for at least 8 hours.   BUN 58 (H) 8 - 23 mg/dL   Creatinine, Ser 1.61 (H) 0.44 - 1.00 mg/dL   Calcium 8.2 (L) 8.9 - 10.3 mg/dL   Total Protein 4.6 (L) 6.5 - 8.1 g/dL   Albumin 2.2 (L) 3.5 - 5.0 g/dL   AST 14 (L) 15 - 41 U/L   ALT 8 0 - 44 U/L   Alkaline Phosphatase 29 (L) 38 - 126 U/L   Total Bilirubin 0.7 0.3 - 1.2 mg/dL   GFR, Estimated 13 (L) >60 mL/min    Comment: (NOTE) Calculated using the CKD-EPI Creatinine Equation (2021)    Anion gap 10 5 - 15    Comment: Performed at [facility], [address] [place],  [place] [address]  Glucose, capillary     Status: None   Collection Time: 10/20/20  7:26 AM  Result Value Ref Range   Glucose-Capillary 91 70 - 99 mg/dL    Comment: Glucose reference range applies only to samples taken after fasting for at least 8 hours.  Glucose, capillary     Status: None   Collection Time: 10/20/20 11:17 AM  Result Value Ref Range   Glucose-Capillary 93 70 - 99 mg/dL    Comment: Glucose reference range applies only to samples taken after fasting for at least 8 hours.  Glucose, capillary     Status: Abnormal   Collection Time: 10/20/20  3:09 PM  Result Value Ref Range   Glucose-Capillary 103 (H) 70 - 99 mg/dL    Comment: Glucose reference range applies only to samples taken after fasting for at least 8 hours.  Glucose, capillary     Status: Abnormal   Collection Time: 10/20/20  7:31 PM  Result Value Ref Range   Glucose-Capillary 104 (H) 70 - 99 mg/dL    Comment: Glucose reference range applies only to samples taken after fasting for at least 8 hours.  Glucose, capillary     Status: Abnormal   Collection Time: 10/20/20  8:48 PM  Result Value Ref Range   Glucose-Capillary 106 (H) 70 - 99 mg/dL    Comment: Glucose reference range applies only to samples taken after fasting for at least 8 hours.  Glucose, capillary     Status: None   Collection Time: 10/21/20 12:30 AM  Result Value Ref Range   Glucose-Capillary 94 70 - 99 mg/dL    Comment: Glucose reference range applies only to samples taken after fasting for at least 8 hours.   Comment 1 Notify RN   Comprehensive metabolic panel     Status: Abnormal   Collection Time: 10/21/20  4:43 AM  Result Value Ref Range   Sodium 143 135 - 145 mmol/L   Potassium 3.8 3.5 - 5.1 mmol/L   Chloride 116 (H) 98 - 111 mmol/L   CO2 18 (L) 22 - 32 mmol/L   Glucose, Bld 91 70 - 99 mg/dL    Comment: Glucose reference range applies only to samples taken after fasting for at least 8 hours.   BUN 44 (H) 8 - 23 mg/dL   Creatinine, Ser 5.40 (H)  0.44 - 1.00 mg/dL  Calcium 8.3 (L) 8.9 - 10.3 mg/dL   Total Protein 4.5 (L) 6.5 - 8.1 g/dL   Albumin 2.1 (L) 3.5 - 5.0 g/dL   AST 16 15 - 41 U/L   ALT 8 0 - 44 U/L   Alkaline Phosphatase 32 (L) 38 - 126 U/L   Total Bilirubin 0.9 0.3 - 1.2 mg/dL   GFR, Estimated 20 (L) >60 mL/min    Comment: (NOTE) Calculated using the CKD-EPI Creatinine Equation (2021)    Anion gap 9 5 - 15    Comment: Performed at [facility], [address] [place], [place] [address]  CBC     Status: Abnormal   Collection Time: 10/21/20  4:43 AM  Result Value Ref Range   WBC 3.9 (L) 4.0 - 10.5 K/uL   RBC 3.26 (L) 3.87 - 5.11 MIL/uL   Hemoglobin 9.4 (L) 12.0 - 15.0 g/dL   HCT 43.3 (L) 29.5 - 18.8 %   MCV 89.0 80.0 - 100.0 fL   MCH 28.8 26.0 - 34.0 pg   MCHC 32.4 30.0 - 36.0 g/dL   RDW 41.6 60.6 - 30.1 %   Platelets 141 (L) 150 - 400 K/uL   nRBC 0.0 0.0 - 0.2 %    Comment: Performed at [facility], [address] [place], [place] [address]  Glucose, capillary     Status: None   Collection Time: 10/21/20  4:47 AM  Result Value Ref Range   Glucose-Capillary 81 70 - 99 mg/dL    Comment: Glucose reference range applies only to samples taken after fasting for at least 8 hours.   Comment 1 Notify RN   Glucose, capillary     Status: None   Collection Time: 10/21/20  7:38 AM  Result Value Ref Range   Glucose-Capillary 82 70 - 99 mg/dL    Comment: Glucose reference range applies only to samples taken after fasting for at least 8 hours.  Ammonia     Status: None   Collection Time: 10/21/20 10:54 AM  Result Value Ref Range   Ammonia 11 9 - 35 umol/L    Comment: Performed at [facility], [address] [place], [place] [address]  Vitamin B12     Status: None   Collection Time: 10/21/20 10:54 AM  Result Value Ref Range   Vitamin B-12 302 180 - 914 pg/mL    Comment: (NOTE) This assay is not validated for testing neonatal or myeloproliferative syndrome specimens for Vitamin B12  levels. Performed at [facility], [address] [place], [place] [address]   TSH     Status: None   Collection Time: 10/21/20 10:54 AM  Result Value Ref Range   TSH 1.145 0.350 - 4.500 uIU/mL    Comment: Performed by a 3rd Generation assay with a functional sensitivity of <=0.01 uIU/mL. Performed at [facility], [address] [place], [place] [address]   Glucose, capillary     Status: None   Collection Time: 10/21/20 12:15 PM  Result Value Ref Range   Glucose-Capillary 95 70 - 99 mg/dL    Comment: Glucose reference range applies only to samples taken after fasting for at least 8 hours.    Current Facility-Administered Medications  Medication Dose Route Frequency Provider Last Rate Last Admin  . 0.9 %  sodium chloride infusion   Intravenous PRN Danford, Earl Lites, MD      . Ampicillin-Sulbactam (UNASYN) 3 g in sodium chloride 0.9 % 100 mL IVPB  3 g Intravenous Q12H Ronnald Ramp, [place]      . aspirin chewable tablet 81 mg  81 mg Oral Daily Arnetha Courser, MD      . atorvastatin (LIPITOR) tablet 40 mg  40 mg Oral Daily Arnetha Courser, MD      . cholecalciferol (VITAMIN D) tablet 1,000 Units  1,000 Units Oral Daily Arnetha Courser, MD      . docusate sodium (COLACE) capsule 100 mg  100 mg Oral BID PRN Arnetha Courser, MD      . feeding supplement (ENSURE ENLIVE / ENSURE PLUS) liquid 237 mL  237 mL Oral BID BM Arnetha Courser, MD      . heparin injection 5,000 Units  5,000 Units Subcutaneous Q8H Arnetha Courser, MD   5,000 Units at 10/21/20 1008  . hydrocortisone sodium succinate (SOLU-CORTEF) 100 MG injection 50 mg  50 mg Intravenous Daily Arnetha Courser, MD   50 mg at 10/21/20 1009  . insulin aspart (novoLOG) injection 0-9 Units  0-9 Units Subcutaneous Q4H Arnetha Courser, MD   1 Units at 10/19/20 1933  . iron polysaccharides (NIFEREX) capsule 150 mg  150 mg Oral Daily Arnetha Courser, MD      . MEDLINE mouth rinse  15 mL Mouth Rinse BID Arnetha Courser, MD   15 mL at 10/21/20  1013  . multivitamin with minerals tablet 1 tablet  1 tablet Oral Daily Arnetha Courser, MD      . pantoprazole (PROTONIX) EC tablet 40 mg  40 mg Oral BID Arnetha Courser, MD      . polyethylene glycol (MIRALAX / GLYCOLAX) packet 17 g  17 g Oral Daily PRN Arnetha Courser, MD        Musculoskeletal: Strength & Muscle Tone: decreased Gait & Station: unable to stand Patient leans: N/A  Psychiatric Specialty Exam: Physical Exam Vitals and nursing note reviewed.  Constitutional:      Appearance: She is well-developed and well-nourished. She is ill-appearing.  HENT:     Head: Normocephalic and atraumatic.  Eyes:     Conjunctiva/sclera: Conjunctivae normal.     Pupils: Pupils are equal, round, and reactive to light.  Cardiovascular:     Heart sounds: Normal heart sounds.  Pulmonary:     Effort: Pulmonary effort is normal.  Abdominal:     Palpations: Abdomen is soft.  Musculoskeletal:        General: Normal range of motion.     Cervical back: Normal range of motion.  Skin:    General: Skin is warm and dry.  Psychiatric:        Attention and Perception: She is inattentive.        Speech: She is noncommunicative.     Review of Systems  Unable to perform ROS: Patient nonverbal  Skin: Negative.     Blood pressure (!) 142/76, pulse 62, temperature 97.7 F (36.5 C), temperature source Axillary, resp. rate 18, height  (1.651 m), weight 62.6 kg, SpO2 98 %.Body mass index is 22.97 kg/m.  General Appearance: Guarded  Eye Contact:  None  Speech:  Negative  Volume:  Decreased  Mood:  Negative  Affect:  Negative  Thought Process:  NA  Orientation:  Negative  Thought Content:  Negative  Suicidal Thoughts:  No  Homicidal Thoughts:  No  Memory:  Negative  Judgement:  Negative  Insight:  Negative  Psychomotor Activity:  Negative  Concentration:  Concentration: Negative  Recall:  Negative  Fund of Knowledge:  Negative  Language:  Negative  Akathisia:  Negative  Handed:  Right   AIMS (if indicated):     Assets:  Social Support  ADL's:  Impaired  Cognition:  Impaired,  Severe  Sleep:        Treatment Plan Summary: Plan 87 year old woman without past psychiatric history but with a history of stroke multiple medical problems recovering now from pneumonia.  Patient's eyes are open but she is unresponsive.  Moving in a stereotyped rhythmic manner.  Unable to do any further assessment.  While it is almost never possible to rule depression out there is nothing right now about her history that would definitely point towards depression.  According to documentation she was verbal and able to have a conversation with the intensive care doctor when she first came in indicating a serious decline since then.  Currently refusing medication.  Not clear whether there is going to be any more aggressive treatment of that or not yet.  No indication therefore to try to aggressively treat mental health condition.  Possible that this could be a type of delirium but I would not recommend adding any antipsychotic if she is not behaving in a dangerous manner.  We will follow-up if needed.  Disposition: No evidence of imminent risk to self or others at present.   Patient does not meet criteria for psychiatric inpatient admission.  Mordecai Rasmussen, MD 10/21/2020 5:02 PM

## 2020-10-21 NOTE — Progress Notes (Addendum)
Speech Language Pathology Treatment: Dysphagia  Patient Details Name: Kim Roy MRN: [id number] DOB: 1931/02/04 Today's Date: 10/21/2020 Time: 1410-1445 SLP Time Calculation (min) (ACUTE ONLY): 35 min  Assessment / Plan / Recommendation Clinical Impression  Attempted dysphagia tx and po trials w/ pt today. Pt was awake, eyes opened but nonverbal and did not give full acknowledgement that understood this SLP and offers of po's. Per NSG, chart notes, "She will open her eyes to voice and track. She is non-verbal and will not follow commands. She is refusing medications again today. She does not appear to be in pain or discomfort.". Also "Pt has not been eating well prior to admit per report.".  Positioned pt upright for po trials. Mod-Max verbal/tactile/visual stim given w/ tsp trials placed at lips to encourage oral acceptance and intake. Pt slightly parted lips x2/6 trials in which a half of a tsp amount of thin liquid was delivered. She appeared to orally hold the bolus trials, no immediate lingual-oral movements appreciated that she was attempted to transfer A-P for swallowing. Pharyngeal swallows were not appreciated per palpation for ~90 secs. No further po's were given orally; oral swab introduced in attempt to clear oral cavity of bolus material. Noted min anterior leakage. Pt looked away w/ attempt of puree on tsp, then looked around SLP at the windows.   Due to pt's lack of awareness and engagement w/ po intake/tasks, pt is at increased risk for aspiration from oropharyngeal phase dysphagia secondary to Cognitive decline. She is at risk for inability to orally meet her nutritional needs safely, adequately. Unsure of pt's Baseline Cognitive status prior to admit, but she appears to exhibit declined Cognitive awareness for engagement of oropharyngeal swallowing. Noted she had declined oral intake prior to admit and was on a medication to address "weight loss" then. Recommend NOT forcing  oral intake w/ pt -- ONLY giving po's if she readily participates and engages in eating/drinking by showing interest in the po's via opening her mouth to accept them. Recommend strict aspiration precautions and feeding support; Dysphagia level 1 (puree) diet w/ thin liquids. Supervision w/ all po's. Recommend MD address any necessary medications via alternative means as pt is not presently engaged in taking po's. MD to reconsult ST services for any diet upgrade or new issues re: swallowing when pt's Cognitive status has improved indicating appropriateness for increased textured po's. Precautions posted in room. Recommend Palliative Care consult to address GOC w/ family, pt.    HPI HPI: Per admitting H&P "87 year old female admitted with aspiration pneumonia & choledocholithiasis in septic shock requiring vasopressors admitted to ICU.     History of Present Illness:  87 year old female arrived at the ED via EMS from [facility] due to complaints of hypotension and altered mental status.  SNF staff reported to EMS that the patient was found less responsive than normal, BP 60/30 was obtained in the field and improved with IV fluids. It is unclear when the patient's LKW time was, SNF staff were unsure"      SLP Plan  Discharge SLP treatment due to (comment) (reduced engagement and awareness for oral intake, refusal)       Recommendations  Diet recommendations: Dysphagia 1 (puree);Thin liquid Liquids provided via: Teaspoon;Cup Medication Administration: Crushed with puree (for safer swallowing) Supervision: Staff to assist with self feeding;Full supervision/cueing for compensatory strategies Compensations: Minimize environmental distractions;Slow rate;Small sips/bites;Lingual sweep for clearance of pocketing;Multiple dry swallows after each bite/sip;Follow solids with liquid Postural Changes and/or Swallow Maneuvers: Seated upright  90 degrees;Upright 30-60 min after meal                 General recommendations:  (Palliative Care f/u for GOC) Oral Care Recommendations: Oral care BID;Oral care before and after PO;Staff/trained caregiver to provide oral care Follow up Recommendations: Skilled Nursing facility (TBD) SLP Visit Diagnosis: Dysphagia, oropharyngeal phase (R13.12);Cognitive communication deficit (R41.841) (Cognitive decline) Plan: Discharge SLP treatment due to (comment) (reduced engagement and awareness for oral intake, refusal)       GO                 Jerilynn Som, MS, CCC-SLP Speech Language Pathologist Rehab Services [phone] [facility] 10/21/2020, 6:34 PM

## 2020-10-21 NOTE — Care Management Important Message (Signed)
Important Message  Patient Details  Name: Kim Roy MRN: [id number] Date of Birth: 1931/04/02   Medicare Important Message Given:  Yes     [name] 10/21/2020, 11:13 AM

## 2020-10-21 NOTE — Progress Notes (Signed)
Mobility Specialist - Progress Note   10/21/20 1600  Mobility  Range of Motion/Exercises All extremities (SLR, HS,)  Level of Assistance Dependent, patient does less than 25%  Assistive Device None  Distance Ambulated (ft) 0 ft  Mobility Response Tolerated fair  Mobility performed by Mobility specialist  $Mobility charge 1 Mobility    Pre-mobility: 62 HR, 100% SpO2 Post-mobility: 67 HR, 100% SpO2   Pt was lying in bed upon arrival. Pt is not verbal, very flat in mood. Mobility provided PROM therex: ankle pumps, straight leg raises, heel slides, abduction, and arm raises with maxA. Pt very restrictive with movement in all extremities. O2 difficult to obtain on multiple devices, but appeared to be in good standing when read. Pt was left in bed with all needs in reach and alarm set. Nurse notified.   Filiberto Pinks Mobility Specialist 10/21/20, 4:10 PM

## 2020-10-21 NOTE — TOC Initial Note (Signed)
Transition of Care [facility]) - Initial/Assessment Note    Patient Details  Name: Kim Roy MRN: [id number] Date of Birth: 23-Dec-1930  Transition of Care [facility]) CM/SW Contact:    Chapman Fitch, RN Phone Number: 10/21/2020, 10:25 AM  Clinical Narrative:                 Patient from [facility].   Confirmed with Verlon Au at [facility] that patient can return at discharge  Psych consult pending Palliative care involved and goal is for family meeting this after noon for goals of care.   If hospice services are indicated, per Verlon Au at [facility] patient could return outpatient with their hospice services at the facility   Will need fl2 after goals of care determined  Expected Discharge Plan: Long Term Nursing Home     Patient Goals and CMS Choice        Expected Discharge Plan and Services Expected Discharge Plan: Long Term Nursing Home       Living arrangements for the past 2 months: Skilled Nursing Facility                                      Prior Living Arrangements/Services Living arrangements for the past 2 months: Skilled Nursing Facility Lives with:: Facility Resident                   Activities of Daily Living Home Assistive Devices/Equipment: Other (Comment) ADL Screening (condition at time of admission) Patient's cognitive ability adequate to safely complete daily activities?: No Is the patient deaf or have difficulty hearing?: Yes Does the patient have difficulty seeing, even when wearing glasses/contacts?: Yes Does the patient have difficulty concentrating, remembering, or making decisions?: Yes Patient able to express need for assistance with ADLs?: Yes Does the patient have difficulty dressing or bathing?: Yes Independently performs ADLs?: No Communication: Independent Dressing (OT): Needs assistance Is this a change from baseline?: Pre-admission baseline Grooming: Dependent Is this a change from baseline?:  Pre-admission baseline Feeding: Needs assistance Is this a change from baseline?: Pre-admission baseline Bathing: Dependent Is this a change from baseline?: Pre-admission baseline Toileting: Needs assistance Is this a change from baseline?: Pre-admission baseline In/Out Bed: Needs assistance Is this a change from baseline?: Pre-admission baseline Walks in Home: Needs assistance Is this a change from baseline?: Pre-admission baseline Does the patient have difficulty walking or climbing stairs?: Yes Weakness of Legs: Both Weakness of Arms/Hands: None  Permission Sought/Granted                  Emotional Assessment           Psych Involvement: Yes (comment)  Admission diagnosis:  Choledocholithiasis [K80.50] Hypoglycemia [E16.2] Septic shock (HCC) [A41.9, R65.21] Sepsis (HCC) [A41.9] Acute renal failure, unspecified acute renal failure type (HCC) [N17.9] Altered mental status, unspecified altered mental status type [R41.82] Aspiration pneumonia of right lung, unspecified aspiration pneumonia type, unspecified part of lung (HCC) [J69.0] Patient Active Problem List   Diagnosis Date Noted  . Pressure injury of skin 10/20/2020  . Protein-calorie malnutrition, severe 10/20/2020  . Palliative care by specialist   . Goals of care, counseling/discussion   . Altered mental status   . Hypoglycemia   . Choledocholithiasis   . Anemia   . Septic shock (HCC) 10/18/2020  . Acute renal failure (HCC) 10/18/2020  . Aspiration pneumonia of right lung (HCC)   .  Syncope 05/01/2020  . UTI (urinary tract infection) 05/01/2020  . Acute metabolic encephalopathy 05/01/2020  . HLD (hyperlipidemia) 05/01/2020  . Hypokalemia 05/01/2020  . CKD (chronic kidney disease), stage IIIa 05/01/2020  . Closed left hip fracture (HCC) 01/19/2019  . Type II diabetes mellitus with renal manifestations (HCC) 01/19/2019  . HTN (hypertension) 01/19/2019   PCP:  [facility] Pharmacy:   [facility] [employer] [facility] - [place], [place] - [address] [place] [address] Phone: [phone] Fax: [phone]     Social Determinants of Health (SDOH) Interventions    Readmission Risk Interventions Readmission Risk Prevention Plan 10/21/2020  Transportation Screening Complete  PCP or Specialist Appt within 3-5 Days Complete  HRI or Home Care Consult Complete  Palliative Care Screening Complete  Medication Review (RN Care Manager) Complete  Some recent data might be hidden

## 2020-10-21 NOTE — Progress Notes (Addendum)
Daily Progress Note   Patient Name: Kim Roy       Date: 10/21/2020 DOB: 1931-04-14  Age: 87 y.o. MRN#: [id number] Attending Physician: Alberteen Sam, * Primary Care Physician: [facility] Admit Date: 10/18/2020  Reason for Consultation/Follow-up: Establishing goals of care  Subjective: Patient has been transferred out of ICU but cognitively, remains the same as yesterday. She will open her eyes to voice and track. She is non-verbal and will not follow commands. She is refusing medications again today. She does not appear to be in pain or discomfort.   GOC:  Spoke with DIL, Marylene Land this AM about patient's condition and plan of care.  Marylene Land is trying to coordinate family meeting for this afternoon if 5 sons are available.   ADDENDUM 1320: Received call from Marylene Land on PMT phone. She has been unable to coordinate meeting with the patient's sons today. Secure vm left when I returned her call. Reassured Marylene Land that Dr. Maryfrances Bunnell has ordered additional metabolic testing and we can attempt family meeting tomorrow 3/3 if sons are available.   Length of Stay: 3  Current Medications: Scheduled Meds:  . aspirin  81 mg Oral Daily  . atorvastatin  40 mg Oral Daily  . cholecalciferol  1,000 Units Oral Daily  . feeding supplement  237 mL Oral BID BM  . heparin  5,000 Units Subcutaneous Q8H  . hydrocortisone sod succinate (SOLU-CORTEF) inj  50 mg Intravenous Daily  . insulin aspart  0-9 Units Subcutaneous Q4H  . iron polysaccharides  150 mg Oral Daily  . mouth rinse  15 mL Mouth Rinse BID  . multivitamin with minerals  1 tablet Oral Daily  . pantoprazole  40 mg Oral BID    Continuous Infusions: . ampicillin-sulbactam (UNASYN) IV      PRN  Meds: docusate sodium, polyethylene glycol  Physical Exam Vitals and nursing note reviewed.  Constitutional:      Appearance: She is cachectic. She is ill-appearing.  HENT:     Head: Normocephalic and atraumatic.  Pulmonary:     Effort: No tachypnea, accessory muscle usage or respiratory distress.  Skin:    General: Skin is warm and dry.  Neurological:     Mental Status: She is easily aroused.     Comments: Awake, tracks but non-verbal. Does  not follow commands.  Psychiatric:        Speech: She is noncommunicative.            Vital Signs: BP (!) 152/101 (BP Location: Left Arm)   Pulse 69   Temp 97.7 F (36.5 C) (Axillary)   Resp 15   Ht 5\' 5"  (1.651 m)   Wt 62.6 kg   SpO2 100%   BMI 22.97 kg/m  SpO2: SpO2: 100 % O2 Device: O2 Device: Room Air O2 Flow Rate:    Intake/output summary:   Intake/Output Summary (Last 24 hours) at 10/21/2020 1104 Last data filed at 10/21/2020 12/21/2020 Gross per 24 hour  Intake 1125.98 ml  Output 800 ml  Net 325.98 ml   LBM: Last BM Date: 10/18/20 Baseline Weight: Weight: 56.7 kg Most recent weight: Weight: 62.6 kg       Palliative Assessment/Data: PPS 20%    Flowsheet Rows   Flowsheet Row Most Recent Value  Intake Tab   Referral Department Hospitalist  Unit at Time of Referral ICU  Palliative Care Primary Diagnosis Other (Comment)  Palliative Care Type New Palliative care  Reason for referral Clarify Goals of Care  Date first seen by Palliative Care 10/20/20  Clinical Assessment   Palliative Performance Scale Score 20%  Psychosocial & Spiritual Assessment   Palliative Care Outcomes   Patient/Family meeting held? Yes  Who was at the meeting? DIL 12/20/20)  Palliative Care Outcomes Clarified goals of care, Counseled regarding hospice, Provided psychosocial or spiritual support, ACP counseling assistance      Patient Active Problem List   Diagnosis Date Noted  . Pressure injury of skin 10/20/2020  . Protein-calorie malnutrition,  severe 10/20/2020  . Palliative care by specialist   . Goals of care, counseling/discussion   . Altered mental status   . Hypoglycemia   . Choledocholithiasis   . Anemia   . Septic shock (HCC) 10/18/2020  . Acute renal failure (HCC) 10/18/2020  . Aspiration pneumonia of right lung (HCC)   . Syncope 05/01/2020  . UTI (urinary tract infection) 05/01/2020  . Acute metabolic encephalopathy 05/01/2020  . HLD (hyperlipidemia) 05/01/2020  . Hypokalemia 05/01/2020  . CKD (chronic kidney disease), stage IIIa 05/01/2020  . Closed left hip fracture (HCC) 01/19/2019  . Type II diabetes mellitus with renal manifestations (HCC) 01/19/2019  . HTN (hypertension) 01/19/2019    Palliative Care Assessment & Plan   Patient Profile: 87 y.o. female  with past medical history of DM, HTN, dyslipidemia, CKD stage IIIa admitted on 10/18/2020 from [facility] with hypotension and AMS. Hospital admission for severe sepsis along with septic shock secondary to aspiration pneumonia initially requiring ICU admission for vasopressor support. CT abdomen reveals choledocholithiasis. GI following. No signs of cholangitis or obstruction, therefore recommending outpatient GI f/u for possible ERCP once sepsis resolved. 3/1 off pressors, vitals stable but patient refusing medications and food. Repeat CT head 3/1. Palliative medicine consultation for goals of care.   Assessment: Severe sepsis with septic shock, resolved Aspiration pneumonia Altered mental status Probably dementia Choledocholithiasis AKI on CKD stage IIIa Protein calorie malnutrition, unspecified severity   Recommendations/Plan: DNR, otherwise continue current plan of care and medical management. CT head negative for acute findings. Dr. [facility] to order additional labs for possible reversible causes related to encephalopathy.  Continue PT/OT/SLP efforts. Per DIL, patient does not have a documented living will or HCPOA. Important for 5 sons to be  involved in medical decision making. DIL Maryfrances Bunnell is very involved and supportive.  Trying to coordinate meeting with sons. Family unavailable this afternoon. PMT provider will call Marylene Land in AM to attempt meeting time with sons again.   Code Status: DNR   Code Status Orders  (From admission, onward)         Start     Ordered   10/19/20 1253  Do not attempt resuscitation (DNR)  Continuous       Question Answer Comment  In the event of cardiac or respiratory ARREST Do not call a "code blue"   In the event of cardiac or respiratory ARREST Do not perform Intubation, CPR, defibrillation or ACLS   In the event of cardiac or respiratory ARREST Use medication by any route, position, wound care, and other measures to relive pain and suffering. May use oxygen, suction and manual treatment of airway obstruction as needed for comfort.   Comments Full scope of medical care      10/19/20 1253        Code Status History    Date Active Date Inactive Code Status Order ID Comments User Context   10/18/2020 0439 10/19/2020 1253 Full Code [id number]  Rust-ChesterCecelia Byars, NP ED   05/01/2020 1822 05/04/2020 2236 Full Code [id number]  Lorretta Harp, MD ED   01/20/2019 0727 01/23/2019 1658 Full Code [id number]  Oralia Manis, MD ED   Advance Care Planning Activity      Prognosis: Poor long-term prognosis  Discharge Planning: To Be Determined  Care plan was discussed with RN, Dr. Maryfrances Bunnell, DIL Marylene Land  Thank you for allowing the Palliative Medicine Team to assist in the care of this patient.   Total Time 20 Prolonged Time Billed  no      Greater than 50%  of this time was spent counseling and coordinating care related to the above assessment and plan.  Vennie Homans, DNP, FNP-C Palliative Medicine Team  Phone: [phone] Fax: [phone]  Please contact Palliative Medicine Team phone at [phone] for questions and concerns.

## 2020-10-22 DIAGNOSIS — J690 Pneumonitis due to inhalation of food and vomit: Secondary | ICD-10-CM | POA: Diagnosis not present

## 2020-10-22 DIAGNOSIS — K8050 Calculus of bile duct without cholangitis or cholecystitis without obstruction: Secondary | ICD-10-CM | POA: Diagnosis not present

## 2020-10-22 DIAGNOSIS — N179 Acute kidney failure, unspecified: Secondary | ICD-10-CM | POA: Diagnosis not present

## 2020-10-22 DIAGNOSIS — R4182 Altered mental status, unspecified: Secondary | ICD-10-CM | POA: Diagnosis not present

## 2020-10-22 LAB — GLUCOSE, CAPILLARY
Glucose-Capillary: 86 mg/dL (ref 70–99)
Glucose-Capillary: 81 mg/dL (ref 70–99)
Glucose-Capillary: 86 mg/dL (ref 70–99)
Glucose-Capillary: 95 mg/dL (ref 70–99)
Glucose-Capillary: 74 mg/dL (ref 70–99)

## 2020-10-22 LAB — RPR: RPR Ser Ql: NONREACTIVE

## 2020-10-22 NOTE — TOC Progression Note (Signed)
Transition of Care [facility]) - Progression Note    Patient Details  Name: Kim Roy MRN: [id number] Date of Birth: [birth year]  Transition of Care [facility]) CM/SW Contact  Chapman Fitch, RN Phone Number: 10/22/2020, 2:04 PM  Clinical Narrative:     Per palliative no final decision has been made on comfort feeds/hospice. Medical team to follow up tomorrow after family discusses tonight  Expected Discharge Plan: Long Term Nursing Home    Expected Discharge Plan and Services Expected Discharge Plan: Long Term Nursing Home       Living arrangements for the past 2 months: Skilled Nursing Facility                                       Social Determinants of Health (SDOH) Interventions    Readmission Risk Interventions Readmission Risk Prevention Plan 10/21/2020  Transportation Screening Complete  PCP or Specialist Appt within 3-5 Days Complete  HRI or Home Care Consult Complete  Palliative Care Screening Complete  Medication Review (RN Care Manager) Complete  Some recent data might be hidden

## 2020-10-22 NOTE — TOC Progression Note (Signed)
Transition of Care [facility]) - Progression Note    Patient Details  Name: Kim Roy MRN: [id number] Date of Birth: Aug 24, 1930  Transition [facility]) CM/SW Contact  Chapman Fitch, RN Phone Number: 10/22/2020, 12:56 PM  Clinical Narrative:      Palliative meeting with family.  Anticipate back to [facility] with hospice vs hospice home Expected Discharge Plan: Long Term Nursing Home    Expected Discharge Plan and Services Expected Discharge Plan: Long Term Nursing Home       Living arrangements for the past 2 months: Skilled Nursing Facility                                       Social Determinants of Health (SDOH) Interventions    Readmission Risk Interventions Readmission Risk Prevention Plan 10/21/2020  Transportation Screening Complete  PCP or Specialist Appt within 3-5 Days Complete  HRI or Home Care Consult Complete  Palliative Care Screening Complete  Medication Review (RN Care Manager) Complete  Some recent data might be hidden

## 2020-10-22 NOTE — Progress Notes (Signed)
Daily Progress Note   Patient Name: Kim Roy       Date: 10/22/2020 DOB: 1930-10-19  Age: 87 y.o. MRN#: [id number] Attending Physician: Fran Lowes, DO Primary Care Physician: [facility] Admit Date: 10/18/2020  Reason for Consultation/Follow-up: Establishing goals of care  Subjective: Patient awake. Tracks NP and family members at bedside but remains non-verbal. Not following commands. Will not accept food/drink or medications. Does not appear to be in pain or discomfort.   GOC:  Palliative GOC discussion with patient's two sons Alinda Money and Kathlene November), DIL and family friend in conference room. Patient's condition is unchanged x3 days of this NP being consulted. Offered to get other brothers on the phone. Alinda Money and Kathlene November report they are unavailable.   Discussed in detail course of hospitalization including diagnoses, interventions, plan of care. Discussed likely undiagnosed dementia prior to admission and significant decline with underlying dementia and acute illness. Discussed dementia disease trajectory and expectations.   Patient does not have a documented living will.   Discussed continued aggressive medical pathway including artificial nutrition/PEG placement vs comfort focused pathway including comfort feeds and initiation of hospice services. Discussed medical recommendation against feeding tube placement with underlying age and dementia, sharing concern that this will not impact her quality of life, just prolong her time here on earth. Discussed complications that can occur with PEG placement.   Alinda Money and Kathlene November are leaning against feeding tube placement, sharing understanding that this will not impact her quality of life and it would be more dignified to  allow nature to take course. Allow her to close her eyes and 'go on.' They share that brother Clide Cliff has been the brother who wishes for feeding tube and transfer to his nursing facility, reasoning for feeding tube being to keep her here a little longer. Again, Alinda Money and Kathlene November seem hesitant to consider feeding tube placement and would rather focus on comfort, comfort feeds and hospice.  Discussed hospice options and philosophy, emphasizing goals for comfort, quality of life, allowing nature to take course, symptom management control, and preventing recurrent hospitalization. Explained that [facility] has already agreed to accept her back with support from hospice if this is what the family wishes.   I did prepare family of poor prognosis, especially if oral intake remains poor and decision is  made against feeding tube placement. We also discussed that she may perk up once back in her normal environment at SNF and if so, she will continue to require assist and encouragement with meals.   Family requests more time to process this conversation and discuss with other family members. They will either call PMT provider back this afternoon with final decision or discuss with staff tomorrow. PMT contact information given.   Answered questions and concerns. Therapeutic listening and support provided.    Length of Stay: 4  Current Medications: Scheduled Meds:  . aspirin  81 mg Oral Daily  . atorvastatin  40 mg Oral Daily  . carvedilol  6.25 mg Oral BID WC  . cholecalciferol  1,000 Units Oral Daily  . feeding supplement  237 mL Oral BID BM  . heparin  5,000 Units Subcutaneous Q8H  . insulin aspart  0-9 Units Subcutaneous Q4H  . iron polysaccharides  150 mg Oral Daily  . mouth rinse  15 mL Mouth Rinse BID  . multivitamin with minerals  1 tablet Oral Daily    Continuous Infusions: . sodium chloride    . ampicillin-sulbactam (UNASYN) IV 3 g (10/21/20 2321)    PRN Meds: sodium chloride,  docusate sodium, polyethylene glycol  Physical Exam Vitals and nursing note reviewed.  Constitutional:      Appearance: She is cachectic. She is ill-appearing.  HENT:     Head: Normocephalic and atraumatic.  Pulmonary:     Effort: No tachypnea, accessory muscle usage or respiratory distress.  Skin:    General: Skin is warm and dry.  Neurological:     Mental Status: She is easily aroused.     Comments: Awake, tracks but non-verbal. Does not follow commands.  Psychiatric:        Speech: She is noncommunicative.            Vital Signs: BP (!) 146/73   Pulse 66   Temp 97.9 F (36.6 C) (Oral)   Resp 16   Ht 5\' 5"  (1.651 m)   Wt 62 kg   SpO2 100%   BMI 22.75 kg/m  SpO2: SpO2: 100 % O2 Device: O2 Device: Room Air O2 Flow Rate:    Intake/output summary:   Intake/Output Summary (Last 24 hours) at 10/22/2020 12/22/2020 Last data filed at 10/22/2020 12/22/2020 Gross per 24 hour  Intake 100 ml  Output 2000 ml  Net -1900 ml   LBM: Last BM Date: 10/18/20 Baseline Weight: Weight: 56.7 kg Most recent weight: Weight: 62 kg       Palliative Assessment/Data: PPS 20%    Flowsheet Rows   Flowsheet Row Most Recent Value  Intake Tab   Referral Department Hospitalist  Unit at Time of Referral ICU  Palliative Care Primary Diagnosis Other (Comment)  Palliative Care Type New Palliative care  Reason for referral Clarify Goals of Care  Date first seen by Palliative Care 10/20/20  Clinical Assessment   Palliative Performance Scale Score 20%  Psychosocial & Spiritual Assessment   Palliative Care Outcomes   Patient/Family meeting held? Yes  Who was at the meeting? DIL 12/20/20)  Palliative Care Outcomes Clarified goals of care, Counseled regarding hospice, Provided psychosocial or spiritual support, ACP counseling assistance      Patient Active Problem List   Diagnosis Date Noted  . Pressure injury of skin 10/20/2020  . Protein-calorie malnutrition, severe 10/20/2020  . Palliative care by  specialist   . Goals of care, counseling/discussion   . Altered mental status   .  Hypoglycemia   . Choledocholithiasis   . Anemia   . Septic shock (HCC) 10/18/2020  . Acute renal failure (HCC) 10/18/2020  . Aspiration pneumonia of right lung (HCC)   . Syncope 05/01/2020  . UTI (urinary tract infection) 05/01/2020  . Acute metabolic encephalopathy 05/01/2020  . HLD (hyperlipidemia) 05/01/2020  . Hypokalemia 05/01/2020  . CKD (chronic kidney disease), stage IIIa 05/01/2020  . Closed left hip fracture (HCC) 01/19/2019  . Type II diabetes mellitus with renal manifestations (HCC) 01/19/2019  . HTN (hypertension) 01/19/2019    Palliative Care Assessment & Plan   Patient Profile: 87 y.o. female  with past medical history of DM, HTN, dyslipidemia, CKD stage IIIa admitted on 10/18/2020 from [facility] with hypotension and AMS. Hospital admission for severe sepsis along with septic shock secondary to aspiration pneumonia initially requiring ICU admission for vasopressor support. CT abdomen reveals choledocholithiasis. GI following. No signs of cholangitis or obstruction, therefore recommending outpatient GI f/u for possible ERCP once sepsis resolved. 3/1 off pressors, vitals stable but patient refusing medications and food. Repeat CT head 3/1. Palliative medicine consultation for goals of care.   Assessment: Severe sepsis with septic shock, resolved Aspiration pneumonia Altered mental status Probably dementia Choledocholithiasis AKI on CKD stage IIIa Protein calorie malnutrition, unspecified severity   Recommendations/Plan: DNR, otherwise continue current plan of care and medical management. Continue PT/OT/SLP efforts Family meeting 10/22/20. Family is not ready to make final decision today and request to further discuss as a family. Family does seem to be leaning towards comfort measures, comfort feeds, and hospice initiation.  Family may call PMT back this afternoon with decision.  If not, Dr. Gerri Lins to follow up with son, Alinda Money tomorrow 3/4 regarding decision.   Code Status: DNR   Code Status Orders  (From admission, onward)         Start     Ordered   10/19/20 1253  Do not attempt resuscitation (DNR)  Continuous       Question Answer Comment  In the event of cardiac or respiratory ARREST Do not call a "code blue"   In the event of cardiac or respiratory ARREST Do not perform Intubation, CPR, defibrillation or ACLS   In the event of cardiac or respiratory ARREST Use medication by any route, position, wound care, and other measures to relive pain and suffering. May use oxygen, suction and manual treatment of airway obstruction as needed for comfort.   Comments Full scope of medical care      10/19/20 1253        Code Status History    Date Active Date Inactive Code Status Order ID Comments User Context   10/18/2020 0439 10/19/2020 1253 Full Code [id number]  Rust-ChesterCecelia Byars, NP ED   05/01/2020 1822 05/04/2020 2236 Full Code [id number]  Lorretta Harp, MD ED   01/20/2019 0727 01/23/2019 1658 Full Code [id number]  Oralia Manis, MD ED   Advance Care Planning Activity      Prognosis: Poor long-term prognosis  Discharge Planning: To Be Determined  Care plan was discussed with RN, Dr. Gerri Lins, family (two sons, DIL, family friend)  Thank you for allowing the Palliative Medicine Team to assist in the care of this patient.   Total Time 60 Prolonged Time Billed  no    Greater than 50% of this time was spent counseling and coordinating care related to the above assessment and plan.   Vennie Homans, DNP, FNP-C Palliative Medicine Team  Phone: [phone] Fax: [phone]  Please contact Palliative Medicine Team phone at [phone] for questions and concerns.

## 2020-10-23 DIAGNOSIS — N179 Acute kidney failure, unspecified: Secondary | ICD-10-CM

## 2020-10-23 DIAGNOSIS — Z515 Encounter for palliative care: Secondary | ICD-10-CM

## 2020-10-23 DIAGNOSIS — E43 Unspecified severe protein-calorie malnutrition: Secondary | ICD-10-CM

## 2020-10-23 DIAGNOSIS — J690 Pneumonitis due to inhalation of food and vomit: Secondary | ICD-10-CM

## 2020-10-23 DIAGNOSIS — R4182 Altered mental status, unspecified: Secondary | ICD-10-CM

## 2020-10-23 DIAGNOSIS — Z7189 Other specified counseling: Secondary | ICD-10-CM

## 2020-10-23 DIAGNOSIS — K8050 Calculus of bile duct without cholangitis or cholecystitis without obstruction: Secondary | ICD-10-CM

## 2020-10-23 LAB — GLUCOSE, CAPILLARY
Glucose-Capillary: 78 mg/dL (ref 70–99)
Glucose-Capillary: 83 mg/dL (ref 70–99)
Glucose-Capillary: 77 mg/dL (ref 70–99)
Glucose-Capillary: 84 mg/dL (ref 70–99)
Glucose-Capillary: 105 mg/dL — ABNORMAL HIGH (ref 70–99)
Glucose-Capillary: 140 mg/dL — ABNORMAL HIGH (ref 70–99)
Glucose-Capillary: 115 mg/dL — ABNORMAL HIGH (ref 70–99)

## 2020-10-23 LAB — CULTURE, BLOOD (ROUTINE X 2)
Culture: NO GROWTH
Culture: NO GROWTH
Special Requests: ADEQUATE
Special Requests: ADEQUATE

## 2020-10-23 NOTE — Progress Notes (Signed)
PROGRESS NOTE  Kim Roy [id number]:[id number] DOB: April 05, 1931 DOA: 10/18/2020 PCP: [facility]  Brief History   Kim Roy is a 87 y.o. F with dementia, living in SNF, DM, HTN, and CKD IIIa who presented from [facility] with hypotension and decreased mentation.  In the ER, BP 60/30 and started on Fluids. She was admitted to the ICU.  Found to have Lactate 7.5, leukocytosis, hypothermia and started on pressors and antibiotics.  CT of the chest and abdomen showed aspiration pneumonia and possible choledocholithiasis.  She has been weaned off pressors and transferred to the floor. GI was consulted, felt that she had no signs of cholangitis, recommended outpatient follow up.  Patient with persistent decreased oral intake, lethargy,decreased responsiveness after resolution of pneumonia. She is not verbally responsive, and does not follow commands. Affect is flat. She was refusing all meds and oral intake.  Psychiatry was consulted out of concern for depression. The patient was seen by Dr. Toni Amend.  Palliative care consulted. He did not seen any clear indication of depression. He saw no indication for aggressive mental health treatment. He felt that it may be a kind of delirium.   It is possible that this is a sort of exacerbation of underlying dementia. Palliative care has been consulted and will be talking with family later today.  She is a DNR. Family has asked for more time to process the options given to them by palliative care.   Today patient seems to be more interactive with tracking and nodding yes and no to her daughter. She nodded yes to her daughter when she asked the patient if she wanted something to eat.  Consultants  . Gastroenterology . Palliative Care . Psychiatry . PCCM  Procedures  . None  Antibiotics   Anti-infectives (From admission, onward)   Start     Dose/Rate Route Frequency Ordered Stop   10/21/20 1100   Ampicillin-Sulbactam (UNASYN) 3 g in sodium chloride 0.9 % 100 mL IVPB        3 g 200 mL/hr over 30 Minutes Intravenous Every 12 hours 10/21/20 1055     10/19/20 2200  Ampicillin-Sulbactam (UNASYN) 3 g in sodium chloride 0.9 % 100 mL IVPB  Status:  Discontinued        3 g 200 mL/hr over 30 Minutes Intravenous Every 24 hours 10/19/20 1329 10/21/20 1055   10/18/20 1200  metroNIDAZOLE (FLAGYL) IVPB 500 mg  Status:  Discontinued        500 mg 100 mL/hr over 60 Minutes Intravenous Every 8 hours 10/18/20 0739 10/19/20 1308   10/18/20 0900  ceFEPIme (MAXIPIME) 2 g in sodium chloride 0.9 % 100 mL IVPB  Status:  Discontinued        2 g 200 mL/hr over 30 Minutes Intravenous Every 24 hours 10/18/20 0739 10/19/20 1308   10/18/20 0245  vancomycin (VANCOREADY) IVPB 1250 mg/250 mL        1,250 mg 166.7 mL/hr over 90 Minutes Intravenous  Once 10/18/20 0240 10/18/20 0640   10/18/20 0230  ceFEPIme (MAXIPIME) 2 g in sodium chloride 0.9 % 100 mL IVPB        2 g 200 mL/hr over 30 Minutes Intravenous  Once 10/18/20 0227 10/18/20 0330   10/18/20 0230  metroNIDAZOLE (FLAGYL) IVPB 500 mg        500 mg 100 mL/hr over 60 Minutes Intravenous  Once 10/18/20 0227 10/18/20 0504   10/18/20 0230  vancomycin (VANCOCIN) IVPB 1000 mg/200 mL premix  Status:  Discontinued        1,000 mg 200 mL/hr over 60 Minutes Intravenous  Once 10/18/20 0227 10/18/20 0240      Subjective  The patient is lying quietly in bed. She is maintaining eye contact better, tracking and nodding yes and no toquestions asked by her daughter. She does not respond to me.  Objective   Vitals:  Vitals:   10/23/20 0732 10/23/20 1139  BP: (!) 143/61 136/64  Pulse: (!) 59 63  Resp: 16   Temp: 98.1 F (36.7 C) 98 F (36.7 C)  SpO2: 100% 99%    Exam:  Constitutional:  . The patient is awake. Non-verbal. Does not follow commands. For me. Nods yes and no to daughter. No acute distress. Eyes:  . pupils and irises appear normal . Normal lids  and conjunctivae . Will briefly make eye contact to voice. Respiratory:  . No increased work of breathing. . No wheezes, rales, or rhonchi . No tactile fremitus Cardiovascular:  . Regular rate and rhythm . No murmurs, ectopy, or gallups. . No lateral PMI. No thrills. Abdomen:  . Abdomen is soft, non-tender, non-distended . No hernias, masses, or organomegaly . Normoactive bowel sounds.  Musculoskeletal:  . No cyanosis, clubbing, or edema Skin:  . No rashes, lesions, ulcers . palpation of skin: no induration or nodules Neurologic:  . CN 2-12 intact . Sensation all 4 extremities intact Psychiatric:  . Mental status o Mood, affect appropriate o Orientation to person, place, time  . judgment and insight appear intact  I have personally reviewed the following:   Today's Data  . Vitals, TSH, Vitamin B12  Micro Data  . Blood cultures x 2 -  no growth x 5 days.  Imaging  . CT head . CXR  Cardiology Data  . EKG  Scheduled Meds: . aspirin  81 mg Oral Daily  . atorvastatin  40 mg Oral Daily  . carvedilol  6.25 mg Oral BID WC  . cholecalciferol  1,000 Units Oral Daily  . feeding supplement  237 mL Oral BID BM  . heparin  5,000 Units Subcutaneous Q8H  . insulin aspart  0-9 Units Subcutaneous Q4H  . iron polysaccharides  150 mg Oral Daily  . mouth rinse  15 mL Mouth Rinse BID  . multivitamin with minerals  1 tablet Oral Daily   Continuous Infusions: . sodium chloride    . ampicillin-sulbactam (UNASYN) IV 3 g (10/23/20 1144)    Principal Problem:   Altered mental status Active Problems:   Type II diabetes mellitus with renal manifestations (HCC)   Septic shock (HCC)   Aspiration pneumonia of right lung (HCC)   Acute renal failure (HCC)   Hypoglycemia   Choledocholithiasis   Anemia   Pressure injury of skin   Protein-calorie malnutrition, severe   Palliative care by specialist   Goals of care, counseling/discussion   LOS: 5 days   A & P  Septic shock due  to aspiration pneumonia:Resolved.  -Continue Unasyn, day 5 of antibiotics -Stop HC  Acute metabolic encephalopathy superimposed on dementia: The differential for this includes encephalopathy from sepsis superimposed on dementia vs encephalopathy from underlying medical condition (thyroid, liver failure, etc).  CT head shows atrophy, no bleeding or stroke; given the time course of her mental status changes, I feel this is sensitive to rule out stroke.   TSH, ammonia, RPR, B12: Negative  Consult palliative care: They will meet with family later today.  Her shock has now resolved,  but she remains unable to take anything by mouth and with decreased mentation.  Neuraxial imaging rules out hemorrhage or stroke.  I discussed with son Alinda Money if she has no underlying reversible metabolic disorder (like hypothyroidism, liver disease, B12 def or syphilis) and has continued poor PO intake, my medical opinion is that given her advanced age and her underlying dementia (which was moderate to advanced prior to this illness), she does not have the functional and cognitive reserve to recover from this septic shock with multiorgan failure.    Palliative will have discussion with family later today. Pending that discussion, if there are no reversible causes found, the recommendation will be for Hospice to enroll patient back to LTC at [facility].  Acute kidney injury on CKD stage IIIa: Creatinine peaked at 3.3, improved to 2.3 today. Monitor electrolytes, creatinine, and volume status. Continue IV fluids due to poor PO intake.   Metabolic acidosis: Improving with fluids  Hypertension: Blood pressure improved. The patient has been restarted on he home dose of carvedilol. Stress steroids have been discontinued. Her home lisinopril and HCTZ are still held due to renal insufficiency. Complicated by patient's refusal to take PO meds.  Diabetes: Pt refusing to eat. Glucoses are followed with FSBS and  SSI.  Moderate protein calorie malnutrition: Pt is at high risk of worsening nutritional status as she is refusing to eat. Much will depend upon the outcome of palliative care discussions. Reduced muscle mass and fat.  Poor oral intake.  Chronic disease.  Stage II buttocks ulcer, present on admission: Wound care.  Anemia of CKD, irone deficiency: Continue iron.  I have seen and examined this patient myself. I have discussed the patient in detail with palliative care. I have spent 42 minutes in the examination and care of this compllicated patient. More than 50% of that was spent in coordination of care.  DVT prophylaxis: heparin injection 5,000 Units Start: 10/18/20 0600 SCDs Start: 10/18/20 0437 Code Status: DNR Family Communication: None available Disposition: Status is: Inpatient  Remains inpatient appropriate because:Altered mental status  Dispo: The patient is from: SNF  Anticipated d/c is to: SNF  Patient currently is not medically stable to d/c.              Difficult to place patient No  Averey Trompeter, DO [facility] Direct contact: see [email or web address]  7PM-7AM contact night coverage as above 10/23/2020, 5:34 AM  LOS: 5 days

## 2020-10-23 NOTE — Progress Notes (Signed)
PROGRESS NOTE  Kim Roy WPY:[id number] DOB: Sep 04, 1930 DOA: 10/18/2020 PCP: [facility]  Brief History   Mrs. Durrett is a 87 y.o. F with dementia, living in SNF, DM, HTN, and CKD IIIa who presented from [facility] with hypotension and decreased mentation.  In the ER, BP 60/30 and started on Fluids. She was admitted to the ICU.  Found to have Lactate 7.5, leukocytosis, hypothermia and started on pressors and antibiotics.  CT of the chest and abdomen showed aspiration pneumonia and possible choledocholithiasis.  She has been weaned off pressors and transferred to the floor. GI was consulted, felt that she had no signs of cholangitis, recommended outpatient follow up.  Patient with persistent decreased oral intake, lethargy,decreased responsiveness after resolution of pneumonia. She is not verbally responsive, and does not follow commands. Affect is flat. She was refusing all meds and oral intake.  Psychiatry was consulted out of concern for depression. The patient was seen by Dr. Toni Amend.  Palliative care consulted. He did not seen any clear indication of depression. He saw no indication for aggressive mental health treatment. He felt that it may be a kind of delirium.   It is possible that this is a sort of exacerbation of underlying dementia. Palliative care has been consulted and will be talking with family later today.  She is a DNR.  Consultants  . Gastroenterology . Palliative Care . Psychiatry . PCCM  Procedures  . None  Antibiotics   Anti-infectives (From admission, onward)   Start     Dose/Rate Route Frequency Ordered Stop   10/21/20 1100  Ampicillin-Sulbactam (UNASYN) 3 g in sodium chloride 0.9 % 100 mL IVPB        3 g 200 mL/hr over 30 Minutes Intravenous Every 12 hours 10/21/20 1055     10/19/20 2200  Ampicillin-Sulbactam (UNASYN) 3 g in sodium chloride 0.9 % 100 mL IVPB  Status:  Discontinued        3 g 200 mL/hr over 30  Minutes Intravenous Every 24 hours 10/19/20 1329 10/21/20 1055   10/18/20 1200  metroNIDAZOLE (FLAGYL) IVPB 500 mg  Status:  Discontinued        500 mg 100 mL/hr over 60 Minutes Intravenous Every 8 hours 10/18/20 0739 10/19/20 1308   10/18/20 0900  ceFEPIme (MAXIPIME) 2 g in sodium chloride 0.9 % 100 mL IVPB  Status:  Discontinued        2 g 200 mL/hr over 30 Minutes Intravenous Every 24 hours 10/18/20 0739 10/19/20 1308   10/18/20 0245  vancomycin (VANCOREADY) IVPB 1250 mg/250 mL        1,250 mg 166.7 mL/hr over 90 Minutes Intravenous  Once 10/18/20 0240 10/18/20 0640   10/18/20 0230  ceFEPIme (MAXIPIME) 2 g in sodium chloride 0.9 % 100 mL IVPB        2 g 200 mL/hr over 30 Minutes Intravenous  Once 10/18/20 0227 10/18/20 0330   10/18/20 0230  metroNIDAZOLE (FLAGYL) IVPB 500 mg        500 mg 100 mL/hr over 60 Minutes Intravenous  Once 10/18/20 0227 10/18/20 0504   10/18/20 0230  vancomycin (VANCOCIN) IVPB 1000 mg/200 mL premix  Status:  Discontinued        1,000 mg 200 mL/hr over 60 Minutes Intravenous  Once 10/18/20 0227 10/18/20 0240    .   Subjective  The patient is lying quietly in bed. She will briefly make eye contact with me, but is non-verbal and  will not follow commands.  Objective   Vitals:  Vitals:   10/23/20 0434 10/23/20 0732  BP: 132/65 (!) 143/61  Pulse: (!) 59 (!) 59  Resp: 16 16  Temp: 97.9 F (36.6 C) 98.1 F (36.7 C)  SpO2: 100% 100%    Exam:  Constitutional:  . The patient is awake. Non-verbal. Does not follow commands. Will make brief contact, but does not track. No acute distress. Eyes:  . pupils and irises appear normal . Normal lids and conjunctivae . Will briefly make eye contact to voice. Respiratory:  . No increased work of breathing. . No wheezes, rales, or rhonchi . No tactile fremitus Cardiovascular:  . Regular rate and rhythm . No murmurs, ectopy, or gallups. . No lateral PMI. No thrills. Abdomen:  . Abdomen is soft, non-tender,  non-distended . No hernias, masses, or organomegaly . Normoactive bowel sounds.  Musculoskeletal:  . No cyanosis, clubbing, or edema Skin:  . No rashes, lesions, ulcers . palpation of skin: no induration or nodules Neurologic:  . CN 2-12 intact . Sensation all 4 extremities intact Psychiatric:  . Mental status o Mood, affect appropriate o Orientation to person, place, time  . judgment and insight appear intact  I have personally reviewed the following:   Today's Data  . Vitals, TSH, Vitamin B12  Micro Data  . Blood cultures x 2 -  no growth x 5 days.  Imaging  . CT head . CXR  Cardiology Data  . EKG  Scheduled Meds: . aspirin  81 mg Oral Daily  . atorvastatin  40 mg Oral Daily  . carvedilol  6.25 mg Oral BID WC  . cholecalciferol  1,000 Units Oral Daily  . feeding supplement  237 mL Oral BID BM  . heparin  5,000 Units Subcutaneous Q8H  . insulin aspart  0-9 Units Subcutaneous Q4H  . iron polysaccharides  150 mg Oral Daily  . mouth rinse  15 mL Mouth Rinse BID  . multivitamin with minerals  1 tablet Oral Daily   Continuous Infusions: . sodium chloride    . ampicillin-sulbactam (UNASYN) IV 3 g (10/23/20 0000)    Principal Problem:   Altered mental status Active Problems:   Type II diabetes mellitus with renal manifestations (HCC)   Septic shock (HCC)   Aspiration pneumonia of right lung (HCC)   Acute renal failure (HCC)   Hypoglycemia   Choledocholithiasis   Anemia   Pressure injury of skin   Protein-calorie malnutrition, severe   Palliative care by specialist   Goals of care, counseling/discussion   LOS: 5 days   A & P  Septic shock due to aspiration pneumonia:Resolved.  -Continue Unasyn, day 5 of antibiotics -Stop HC  Acute metabolic encephalopathy superimposed on dementia: The differential for this includes encephalopathy from sepsis superimposed on dementia vs encephalopathy from underlying medical condition (thyroid, liver failure, etc).  CT  head shows atrophy, no bleeding or stroke; given the time course of her mental status changes, I feel this is sensitive to rule out stroke.   TSH, ammonia, RPR, B12: Negative  Consult palliative care: They will meet with family later today.  Her shock has now resolved, but she remains unable to take anything by mouth and with decreased mentation.  Neuraxial imaging rules out hemorrhage or stroke.  I discussed with son Alinda Money if she has no underlying reversible metabolic disorder (like hypothyroidism, liver disease, B12 def or syphilis) and has continued poor PO intake, my medical opinion is that given her  advanced age and her underlying dementia (which was moderate to advanced prior to this illness), she does not have the functional and cognitive reserve to recover from this septic shock with multiorgan failure.    Palliative will have discussion with family later today. Pending that discussion, if there are no reversible causes found, the recommendation will be for Hospice to enroll patient back to LTC at [facility].  Acute kidney injury on CKD stage IIIa: Creatinine peaked at 3.3, improved to 2.3 today. Monitor electrolytes, creatinine, and volume status. Continue IV fluids due to poor PO intake.   Metabolic acidosis: Improving with fluids  Hypertension: Blood pressure improved. The patient has been restarted on he home dose of carvedilol. Stress steroids have been discontinued. Her home lisinopril and HCTZ are still held due to renal insufficiency. Complicated by patient's refusal to take PO meds.  Diabetes: Pt refusing to eat. Glucoses are followed with FSBS and SSI.  Moderate protein calorie malnutrition: Pt is at high risk of worsening nutritional status as she is refusing to eat. Much will depend upon the outcome of palliative care discussions. Reduced muscle mass and fat.  Poor oral intake.  Chronic disease.  Stage II buttocks ulcer, present on admission: Wound  care.  Anemia of CKD, irone deficiency: Continue iron.  I have seen and examined this patient myself. I have discussed the patient in detail with palliative care. I have spent 42 minutes in the examination and care of this compllicated patient. More than 50% of that was spent in coordination of care.  DVT prophylaxis: heparin injection 5,000 Units Start: 10/18/20 0600 SCDs Start: 10/18/20 0437 Code Status: DNR Family Communication: None available Disposition: Status is: Inpatient  Remains inpatient appropriate because:Altered mental status  Dispo: The patient is from: SNF  Anticipated d/c is to: SNF  Patient currently is not medically stable to d/c.              Difficult to place patient No  Ava Swayze, DO [facility] Direct contact: see [email or web address]  7PM-7AM contact night coverage as above 10/22/2020, 2:34 AM  LOS: 5 days

## 2020-10-23 NOTE — Progress Notes (Signed)
Pharmacy Antibiotic Note  Kim Roy is a 87 y.o. female admitted on 10/18/2020. Pharmacy has been consulted for Unasyn dosing.  Plan: Day 5. Unasyn 3 g IV q12H  Height: 5\' 5"  (165.1 cm) Weight: 59.4 kg (130 lb 15.3 oz) IBW/kg (Calculated) : 57  Temp (24hrs), Avg:98 F (36.7 C), Min:97.8 F (36.6 C), Max:98.1 F (36.7 C)  Recent Labs  Lab 10/18/20 0150 10/18/20 0440 10/18/20 1138 10/18/20 2144 10/19/20 0557 10/20/20 0716 10/21/20 0443  WBC 5.8  --   --   --   --   --  3.9*  CREATININE 2.37*  --   --   --  3.31* 3.24* 2.33*  LATICACIDVEN 5.4* 6.9* 7.5* 2.3* 1.5  --   --     Estimated Creatinine Clearance: 15.3 mL/min (A) (by C-G formula based on SCr of 2.33 mg/dL (H)).    No Known Allergies  Antimicrobials this admission: Vancomycin 2/27 x 1 Cefepime 2/27 >> 2/28 Flagyl 2/27 >> 2/28 Unasyn 2/28 >>   Microbiology results: 2/27 BCx: NG 2/27 UCx: NG   Thank you for allowing pharmacy to be a part of this patient's care.  3/27, PharmD, BCPS 10/23/2020 1:41 PM

## 2020-10-24 DIAGNOSIS — N1831 Chronic kidney disease, stage 3a: Secondary | ICD-10-CM

## 2020-10-24 DIAGNOSIS — A419 Sepsis, unspecified organism: Secondary | ICD-10-CM | POA: Diagnosis not present

## 2020-10-24 DIAGNOSIS — N179 Acute kidney failure, unspecified: Secondary | ICD-10-CM | POA: Diagnosis not present

## 2020-10-24 DIAGNOSIS — L89302 Pressure ulcer of unspecified buttock, stage 2: Secondary | ICD-10-CM

## 2020-10-24 DIAGNOSIS — E1122 Type 2 diabetes mellitus with diabetic chronic kidney disease: Secondary | ICD-10-CM

## 2020-10-24 DIAGNOSIS — N1832 Chronic kidney disease, stage 3b: Secondary | ICD-10-CM

## 2020-10-24 DIAGNOSIS — D631 Anemia in chronic kidney disease: Secondary | ICD-10-CM

## 2020-10-24 DIAGNOSIS — R4182 Altered mental status, unspecified: Secondary | ICD-10-CM | POA: Diagnosis not present

## 2020-10-24 LAB — GLUCOSE, CAPILLARY
Glucose-Capillary: 105 mg/dL — ABNORMAL HIGH (ref 70–99)
Glucose-Capillary: 106 mg/dL — ABNORMAL HIGH (ref 70–99)
Glucose-Capillary: 114 mg/dL — ABNORMAL HIGH (ref 70–99)
Glucose-Capillary: 122 mg/dL — ABNORMAL HIGH (ref 70–99)
Glucose-Capillary: 136 mg/dL — ABNORMAL HIGH (ref 70–99)
Glucose-Capillary: 99 mg/dL (ref 70–99)

## 2020-10-24 NOTE — Progress Notes (Addendum)
Patient alert and opens mouth but is unable to suck from a straw and when spooned water in mouth patient held in mouth and did not swallow until it ran out sides of mouth.  No medication given.  Will continue to montior.  Patient continued to not be able to take PO meds nor food. Taking sips of liquid. UOP adequate. MD notified

## 2020-10-24 NOTE — Progress Notes (Signed)
PROGRESS NOTE  Kim Roy [id number]:[id number] DOB: July 07, 1931 DOA: 10/18/2020 PCP: [facility]  Brief History   Kim Roy is a 87 y.o. F with dementia, living in SNF, DM, HTN, and CKD IIIa who presented from [facility] with hypotension and decreased mentation.  In the ER, BP 60/30 and started on Fluids. She was admitted to the ICU.  Found to have Lactate 7.5, leukocytosis, hypothermia and started on pressors and antibiotics.  CT of the chest and abdomen showed aspiration pneumonia and possible choledocholithiasis.  She has been weaned off pressors and transferred to the floor. GI was consulted, felt that she had no signs of cholangitis, recommended outpatient follow up.  Patient with persistent decreased oral intake, lethargy,decreased responsiveness after resolution of pneumonia. She is not verbally responsive, and does not follow commands. Affect is flat. She was refusing all meds and oral intake.  Psychiatry was consulted out of concern for depression. The patient was seen by Dr. Toni Amend.  Palliative care consulted. He did not seen any clear indication of depression. He saw no indication for aggressive mental health treatment. He felt that it may be a kind of delirium.   It is possible that this is a sort of exacerbation of underlying dementia. Palliative care has been consulted and will be talking with family later today.  She is a DNR. Family has asked for more time to process the options given to them by palliative care.   Patient is being visited by her son Kim Roy today. She has been communicating with him through nods. She is not verbal with him. She does open her eyes to voice and track. She continues to refuse to suck from a straw and refuses to swallow water spooned into her mouth.  Consultants  . Gastroenterology . Palliative Care . Psychiatry . PCCM  Procedures  . None  Antibiotics   Anti-infectives (From admission, onward)    Start     Dose/Rate Route Frequency Ordered Stop   10/21/20 1100  Ampicillin-Sulbactam (UNASYN) 3 g in sodium chloride 0.9 % 100 mL IVPB        3 g 200 mL/hr over 30 Minutes Intravenous Every 12 hours 10/21/20 1055     10/19/20 2200  Ampicillin-Sulbactam (UNASYN) 3 g in sodium chloride 0.9 % 100 mL IVPB  Status:  Discontinued        3 g 200 mL/hr over 30 Minutes Intravenous Every 24 hours 10/19/20 1329 10/21/20 1055   10/18/20 1200  metroNIDAZOLE (FLAGYL) IVPB 500 mg  Status:  Discontinued        500 mg 100 mL/hr over 60 Minutes Intravenous Every 8 hours 10/18/20 0739 10/19/20 1308   10/18/20 0900  ceFEPIme (MAXIPIME) 2 g in sodium chloride 0.9 % 100 mL IVPB  Status:  Discontinued        2 g 200 mL/hr over 30 Minutes Intravenous Every 24 hours 10/18/20 0739 10/19/20 1308   10/18/20 0245  vancomycin (VANCOREADY) IVPB 1250 mg/250 mL        1,250 mg 166.7 mL/hr over 90 Minutes Intravenous  Once 10/18/20 0240 10/18/20 0640   10/18/20 0230  ceFEPIme (MAXIPIME) 2 g in sodium chloride 0.9 % 100 mL IVPB        2 g 200 mL/hr over 30 Minutes Intravenous  Once 10/18/20 0227 10/18/20 0330   10/18/20 0230  metroNIDAZOLE (FLAGYL) IVPB 500 mg        500 mg 100 mL/hr over 60 Minutes Intravenous  Once  10/18/20 0227 10/18/20 0504   10/18/20 0230  vancomycin (VANCOCIN) IVPB 1000 mg/200 mL premix  Status:  Discontinued        1,000 mg 200 mL/hr over 60 Minutes Intravenous  Once 10/18/20 0227 10/18/20 0240      Subjective  The patient is lying quietly in bed. She is maintaining eye contact better, tracking and nodding yes and no toquestions asked by her daughter. She does not respond to me.  Objective   Vitals:  Vitals:   10/24/20 1208 10/24/20 1541  BP: 137/69 132/71  Pulse: 69 64  Resp: 18 18  Temp: 97.9 F (36.6 C) 98.7 F (37.1 C)  SpO2: 97% 99%    Exam:  Constitutional:  . The patient is awake. Non-verbal. Does not follow commands. For me. Nods yes and no to son. No acute  distress. Eyes:  . pupils and irises appear normal . Normal lids and conjunctivae . Will sustain eye contact to voice. Respiratory:  . No increased work of breathing. . No wheezes, rales, or rhonchi . No tactile fremitus Cardiovascular:  . Regular rate and rhythm . No murmurs, ectopy, or gallups. . No lateral PMI. No thrills. Abdomen:  . Abdomen is soft, non-tender, non-distended . No hernias, masses, or organomegaly . Normoactive bowel sounds.  Musculoskeletal:  . No cyanosis, clubbing, or edema Skin:  . No rashes, lesions, ulcers . palpation of skin: no induration or nodules Neurologic:  . CN 2-12 intact . Sensation all 4 extremities intact Psychiatric:  . Mental status o Mood, affect appropriate o Orientation to person, place, time  . judgment and insight appear intact  I have personally reviewed the following:   Today's Data  . Vitals, TSH, Vitamin B12  Micro Data  . Blood cultures x 2 -  no growth x 5 days.  Imaging  . CT head . CXR  Cardiology Data  . EKG  Scheduled Meds: . aspirin  81 mg Oral Daily  . atorvastatin  40 mg Oral Daily  . carvedilol  6.25 mg Oral BID WC  . cholecalciferol  1,000 Units Oral Daily  . feeding supplement  237 mL Oral BID BM  . heparin  5,000 Units Subcutaneous Q8H  . insulin aspart  0-9 Units Subcutaneous Q4H  . iron polysaccharides  150 mg Oral Daily  . mouth rinse  15 mL Mouth Rinse BID  . multivitamin with minerals  1 tablet Oral Daily   Continuous Infusions: . sodium chloride    . ampicillin-sulbactam (UNASYN) IV 3 g (10/24/20 1204)    Principal Problem:   Altered mental status Active Problems:   Type II diabetes mellitus with renal manifestations (HCC)   Septic shock (HCC)   Aspiration pneumonia of right lung (HCC)   Acute renal failure (HCC)   Hypoglycemia   Choledocholithiasis   Anemia   Pressure injury of skin   Protein-calorie malnutrition, severe   Palliative care by specialist   Goals of care,  counseling/discussion   LOS: 6 days   A & P  Septic shock due to aspiration pneumonia:Resolved.  -Continue Unasyn, day 5 of antibiotics -Stop HC  Acute metabolic encephalopathy superimposed on dementia: The differential for this includes encephalopathy from sepsis superimposed on dementia vs encephalopathy from underlying medical condition (thyroid, liver failure, etc).  CT head shows atrophy, no bleeding or stroke; given the time course of her mental status changes, I feel this is sensitive to rule out stroke.  Somewhat improved. She is making and sustaining eye contact.  She will nod in answer to questions and tracking, but she is not taking PO.  TSH, ammonia, RPR, B12: Negative  Consult palliative care: They will meet with family later today.  Her shock has now resolved, but she remains unable to take anything by mouth and with decreased mentation.  Neuraxial imaging rules out hemorrhage or stroke.  I discussed with son Kim Roy if she has no underlying reversible metabolic disorder (like hypothyroidism, liver disease, B12 def or syphilis) and has continued poor PO intake, my medical opinion is that given her advanced age and her underlying dementia (which was moderate to advanced prior to this illness), she does not have the functional and cognitive reserve to recover from this septic shock with multiorgan failure.    Palliative will have discussion with family later today. Pending that discussion, if there are no reversible causes found, the recommendation will be for Hospice to enroll patient back to LTC at [facility].  Acute kidney injury on CKD stage IIIa: Creatinine peaked at 3.3, improved to 2.3 today. Monitor electrolytes, creatinine, and volume status. Continue IV fluids due to poor PO intake.   Metabolic acidosis: Improving with fluids  Hypertension: Blood pressure improved. The patient has been restarted on he home dose of carvedilol. Stress steroids have been  discontinued. Her home lisinopril and HCTZ are still held due to renal insufficiency. Complicated by patient's refusal to take PO meds.  Diabetes: Pt refusing to eat. Glucoses are followed with FSBS and SSI.  Moderate protein calorie malnutrition: Pt is at high risk of worsening nutritional status as she is refusing to eat. Much will depend upon the outcome of palliative care discussions. Reduced muscle mass and fat.  Poor oral intake.  Chronic disease.  Stage II buttocks ulcer, present on admission: Wound care.  Anemia of CKD, irone deficiency: Continue iron.  I have seen and examined this patient myself. I have discussed the patient in detail with palliative care. I have spent 32 minutes in the examination and care of this compllicated patient. More than 50% of that was spent in coordination of care.  DVT prophylaxis: heparin injection 5,000 Units Start: 10/18/20 0600 SCDs Start: 10/18/20 0437 Code Status: DNR Family Communication: None available Disposition: Status is: Inpatient  Remains inpatient appropriate because:Altered mental status  Dispo: The patient is from: SNF  Anticipated d/c is to: SNF  Patient currently is not medically stable to d/c.              Difficult to place patient No  Kim Swayze, DO [facility] Direct contact: see [email or web address]  7PM-7AM contact night coverage as above 10/24/2020, 4:17 AM  LOS: 5 days

## 2020-10-25 DIAGNOSIS — N179 Acute kidney failure, unspecified: Secondary | ICD-10-CM | POA: Diagnosis not present

## 2020-10-25 DIAGNOSIS — K8050 Calculus of bile duct without cholangitis or cholecystitis without obstruction: Secondary | ICD-10-CM | POA: Diagnosis not present

## 2020-10-25 DIAGNOSIS — R4182 Altered mental status, unspecified: Secondary | ICD-10-CM | POA: Diagnosis not present

## 2020-10-25 DIAGNOSIS — J690 Pneumonitis due to inhalation of food and vomit: Secondary | ICD-10-CM | POA: Diagnosis not present

## 2020-10-25 LAB — GLUCOSE, CAPILLARY
Glucose-Capillary: 107 mg/dL — ABNORMAL HIGH (ref 70–99)
Glucose-Capillary: 108 mg/dL — ABNORMAL HIGH (ref 70–99)
Glucose-Capillary: 78 mg/dL (ref 70–99)
Glucose-Capillary: 82 mg/dL (ref 70–99)
Glucose-Capillary: 83 mg/dL (ref 70–99)
Glucose-Capillary: 92 mg/dL (ref 70–99)
Glucose-Capillary: 98 mg/dL (ref 70–99)

## 2020-10-25 LAB — CBC WITH DIFFERENTIAL/PLATELET
Abs Immature Granulocytes: 0.02 10*3/uL (ref 0.00–0.07)
Basophils Absolute: 0 10*3/uL (ref 0.0–0.1)
Basophils Relative: 0 %
Eosinophils Absolute: 0 10*3/uL (ref 0.0–0.5)
Eosinophils Relative: 0 %
HCT: 26.3 % — ABNORMAL LOW (ref 36.0–46.0)
Hemoglobin: 8.3 g/dL — ABNORMAL LOW (ref 12.0–15.0)
Immature Granulocytes: 1 %
Lymphocytes Relative: 19 %
Lymphs Abs: 0.7 10*3/uL (ref 0.7–4.0)
MCH: 28.4 pg (ref 26.0–34.0)
MCHC: 31.6 g/dL (ref 30.0–36.0)
MCV: 90.1 fL (ref 80.0–100.0)
Monocytes Absolute: 0.3 10*3/uL (ref 0.1–1.0)
Monocytes Relative: 8 %
Neutro Abs: 2.7 10*3/uL (ref 1.7–7.7)
Neutrophils Relative %: 72 %
Platelets: 149 10*3/uL — ABNORMAL LOW (ref 150–400)
RBC: 2.92 MIL/uL — ABNORMAL LOW (ref 3.87–5.11)
RDW: 15.1 % (ref 11.5–15.5)
WBC: 3.7 10*3/uL — ABNORMAL LOW (ref 4.0–10.5)
nRBC: 0 % (ref 0.0–0.2)

## 2020-10-25 LAB — BASIC METABOLIC PANEL
Anion gap: 9 (ref 5–15)
Anion gap: 9 (ref 5–15)
BUN: 26 mg/dL — ABNORMAL HIGH (ref 8–23)
BUN: 23 mg/dL (ref 8–23)
CO2: 24 mmol/L (ref 22–32)
CO2: 23 mmol/L (ref 22–32)
Calcium: 8.2 mg/dL — ABNORMAL LOW (ref 8.9–10.3)
Calcium: 8.3 mg/dL — ABNORMAL LOW (ref 8.9–10.3)
Chloride: 118 mmol/L — ABNORMAL HIGH (ref 98–111)
Chloride: 118 mmol/L — ABNORMAL HIGH (ref 98–111)
Creatinine, Ser: 0.86 mg/dL (ref 0.44–1.00)
Creatinine, Ser: 0.83 mg/dL (ref 0.44–1.00)
GFR, Estimated: >60 mL/min (ref >60)
GFR, Estimated: >60 mL/min (ref >60)
Glucose, Bld: 109 mg/dL — ABNORMAL HIGH (ref 70–99)
Glucose, Bld: 91 mg/dL (ref 70–99)
Potassium: 2.3 mmol/L — CL (ref 3.5–5.1)
Potassium: 3.2 mmol/L — ABNORMAL LOW (ref 3.5–5.1)
Sodium: 151 mmol/L — ABNORMAL HIGH (ref 135–145)
Sodium: 150 mmol/L — ABNORMAL HIGH (ref 135–145)

## 2020-10-25 LAB — MAGNESIUM
Magnesium: 1.4 mg/dL — ABNORMAL LOW (ref 1.7–2.4)
Magnesium: 1.4 mg/dL — ABNORMAL LOW (ref 1.7–2.4)

## 2020-10-25 MED ORDER — POTASSIUM CHLORIDE 10 MEQ/100ML IV SOLN
10.0000 meq | INTRAVENOUS | Status: AC
Start: 2020-10-25 — End: 2020-10-25
  Administered 2020-10-25 (×6): 10 meq via INTRAVENOUS
  Filled 2020-10-25 (×4): qty 100

## 2020-10-25 MED ORDER — POTASSIUM CHLORIDE 10 MEQ/100ML IV SOLN
10.0000 meq | INTRAVENOUS | Status: AC
  Administered 2020-10-25 (×4): 10 meq via INTRAVENOUS
  Filled 2020-10-25 (×4): qty 100

## 2020-10-25 MED ORDER — SODIUM CHLORIDE 0.9 % IV SOLN
3.0000 g | Freq: Four times a day (QID) | INTRAVENOUS | Status: DC
  Administered 2020-10-25 – 2020-10-26 (×5): 3 g via INTRAVENOUS
  Filled 2020-10-25 (×4): qty 8
  Filled 2020-10-25: qty 3
  Filled 2020-10-25 (×2): qty 8
  Filled 2020-10-25 (×2): qty 3

## 2020-10-25 MED ORDER — POTASSIUM CHLORIDE 10 MEQ/100ML IV SOLN: 10.0000 meq | INTRAVENOUS | Status: DC

## 2020-10-25 NOTE — Progress Notes (Signed)
Pharmacy Antibiotic Note  Kim Roy is a 87 y.o. female admitted on 10/18/2020 with aspiration pneumonia & choledocholithiasis in septic shock. She was originally admitted to CCU requiring pressors but subsequently weaned off pressors and transferred to the floor. Pharmacy has been consulted for Unasyn dosing. Her renal function has improved since admission.  Plan: adjust Unasyn dose to 3 grams IV every 6 hours  Height: 5\' 5"  (165.1 cm) Weight: 59.9 kg (132 lb 1.6 oz) IBW/kg (Calculated) : 57  Temp (24hrs), Avg:98 F (36.7 C), Min:97.7 F (36.5 C), Max:98.7 F (37.1 C)  Recent Labs  Lab 10/18/20 1138 10/18/20 2144 10/19/20 0557 10/20/20 0716 10/21/20 0443 10/25/20 0507  WBC  --   --   --   --  3.9* 3.7*  CREATININE  --   --  3.31* 3.24* 2.33* 0.86  LATICACIDVEN 7.5* 2.3* 1.5  --   --   --     Estimated Creatinine Clearance: 41.5 mL/min (by C-G formula based on SCr of 0.86 mg/dL).    No Known Allergies  Antimicrobials this admission: Cefepime 2/27 >> 2/28 Flagyl 2/27 >> 2/28 Unasyn 2/28 >>  Microbiology results: 2/27 BCx: NG final 2/27 UCx: NG final 2/27 SARS CoV-2: negative 2/27 influenza A/B  Thank you for allowing pharmacy to be a part of this patient's care.  3/27, PharmD, BCPS 10/25/2020 7:57 AM

## 2020-10-25 NOTE — Progress Notes (Addendum)
PROGRESS NOTE  Kim Roy [id number]:[id number] DOB: 05-Jan-1931 DOA: 10/18/2020 PCP: [facility] History   Kim Roy is a 87 y.o. F with dementia, living in SNF, DM, HTN, and CKD IIIa who presented from [facility] with hypotension and decreased mentation.  In the ER, BP 60/30 and started on Fluids. She was admitted to the ICU.  Found to have Lactate 7.5, leukocytosis, hypothermia and started on pressors and antibiotics.  CT of the chest and abdomen showed aspiration pneumonia and possible choledocholithiasis.  She has been weaned off pressors and transferred to the floor. GI was consulted, felt that she had no signs of cholangitis, recommended outpatient follow up.  Patient with persistent decreased oral intake, lethargy,decreased responsiveness after resolution of pneumonia. She is not verbally responsive, and does not follow commands. Affect is flat. She was refusing all meds and oral intake.  Psychiatry was consulted out of concern for depression. The patient was seen by Dr. Weber Cooks.  Palliative care consulted. He did not seen any clear indication of depression. He saw no indication for aggressive mental health treatment. He felt that it may be a kind of delirium.   It is possible that this is a sort of exacerbation of underlying dementia. Palliative care has been consulted and will be talking with family later today.  She is a DNR. Family has asked for more time to process the options given to them by palliative care.   Patient is being visited by her son [name] again today. He was here yesterday and was witness to the patient's nodding yes to eating. However, when attempts were made to feed her she sealed her lips closed. When it was possible to spoon anything into her mouth, she would just either hold it there or it would leak out of the side of her mouth. She did not know how to swallow. I explained to him that she is dementia. I discussed with  him a feeding tube and the complications that they can add to a patient's final days. I explained to him comfort care. I told him that comfort care and hospice is the best that we can do for the patient now. I have encouraged him to speak with his family and for them to get back to [place] with a decision that they agree upon ASAP.  Consultants  . Gastroenterology . Palliative Care . Psychiatry . PCCM  Procedures  . None  Antibiotics   Anti-infectives (From admission, onward)   Start     Dose/Rate Route Frequency Ordered Stop   10/25/20 1200  Ampicillin-Sulbactam (UNASYN) 3 g in sodium chloride 0.9 % 100 mL IVPB        3 g 200 mL/hr over 30 Minutes Intravenous Every 6 hours 10/25/20 0757     10/21/20 1100  Ampicillin-Sulbactam (UNASYN) 3 g in sodium chloride 0.9 % 100 mL IVPB  Status:  Discontinued        3 g 200 mL/hr over 30 Minutes Intravenous Every 12 hours 10/21/20 1055 10/25/20 0757   10/19/20 2200  Ampicillin-Sulbactam (UNASYN) 3 g in sodium chloride 0.9 % 100 mL IVPB  Status:  Discontinued        3 g 200 mL/hr over 30 Minutes Intravenous Every 24 hours 10/19/20 1329 10/21/20 1055   10/18/20 1200  metroNIDAZOLE (FLAGYL) IVPB 500 mg  Status:  Discontinued        500 mg 100 mL/hr over 60 Minutes Intravenous Every 8 hours 10/18/20 0739  10/19/20 1308   10/18/20 0900  ceFEPIme (MAXIPIME) 2 g in sodium chloride 0.9 % 100 mL IVPB  Status:  Discontinued        2 g 200 mL/hr over 30 Minutes Intravenous Every 24 hours 10/18/20 0739 10/19/20 1308   10/18/20 0245  vancomycin (VANCOREADY) IVPB 1250 mg/250 mL        1,250 mg 166.7 mL/hr over 90 Minutes Intravenous  Once 10/18/20 0240 10/18/20 0640   10/18/20 0230  ceFEPIme (MAXIPIME) 2 g in sodium chloride 0.9 % 100 mL IVPB        2 g 200 mL/hr over 30 Minutes Intravenous  Once 10/18/20 0227 10/18/20 0330   10/18/20 0230  metroNIDAZOLE (FLAGYL) IVPB 500 mg        500 mg 100 mL/hr over 60 Minutes Intravenous  Once 10/18/20 0227 10/18/20 0504    10/18/20 0230  vancomycin (VANCOCIN) IVPB 1000 mg/200 mL premix  Status:  Discontinued        1,000 mg 200 mL/hr over 60 Minutes Intravenous  Once 10/18/20 0227 10/18/20 0240      Subjective  The patient is lying quietly in bed. She is maintaining eye contact better, tracking and nodding yes and no toquestions asked by her daughter. She does not respond to me. She does not follow commands.  Objective   Vitals:  Vitals:   10/24/20 2003 10/25/20 0507  BP: 139/66 131/78  Pulse: 76 76  Resp: 16 16  Temp: 98 F (36.7 C) 97.7 F (36.5 C)  SpO2: 91% 93%    Exam:  Constitutional:  . The patient is awake. Non-verbal. Does not follow commands. For me. Nods yes and no to son. No acute distress. Eyes:  . pupils and irises appear normal . Normal lids and conjunctivae . Will sustain eye contact to voice. Respiratory:  . No increased work of breathing. . No wheezes, rales, or rhonchi . No tactile fremitus Cardiovascular:  . Regular rate and rhythm . No murmurs, ectopy, or gallups. . No lateral PMI. No thrills. Abdomen:  . Abdomen is soft, non-tender, non-distended . No hernias, masses, or organomegaly . Normoactive bowel sounds.  Musculoskeletal:  . No cyanosis, clubbing, or edema Skin:  . No rashes, lesions, ulcers . palpation of skin: no induration or nodules Neurologic:  . CN 2-12 intact . Sensation all 4 extremities intact Psychiatric:  . Mental status o Mood, affect appropriate o Orientation to person, place, time  . judgment and insight appear intact  I have personally reviewed the following:   Today's Data  . Vitals, TSH, Vitamin B12  Micro Data  . Blood cultures x 2 -  no growth x 5 days.  Imaging  . CT head . CXR  Cardiology Data  . EKG  Scheduled Meds: . aspirin  81 mg Oral Daily  . atorvastatin  40 mg Oral Daily  . carvedilol  6.25 mg Oral BID WC  . cholecalciferol  1,000 Units Oral Daily  . feeding supplement  237 mL Oral BID BM  . heparin   5,000 Units Subcutaneous Q8H  . insulin aspart  0-9 Units Subcutaneous Q4H  . iron polysaccharides  150 mg Oral Daily  . mouth rinse  15 mL Mouth Rinse BID  . multivitamin with minerals  1 tablet Oral Daily   Continuous Infusions: . sodium chloride    . ampicillin-sulbactam (UNASYN) IV 3 g (10/25/20 1216)    Principal Problem:   Altered mental status Active Problems:   Type II diabetes  mellitus with renal manifestations (HCC)   Septic shock (HCC)   Aspiration pneumonia of right lung (HCC)   Acute renal failure (HCC)   Hypoglycemia   Choledocholithiasis   Anemia   Pressure injury of skin   Protein-calorie malnutrition, severe   Palliative care by specialist   Goals of care, counseling/discussion   LOS: 7 days   A & P  Septic shock due to aspiration pneumonia:Resolved.  -Continue Unasyn -Stop HC  Acute metabolic encephalopathy superimposed on dementia: The differential for this includes encephalopathy from sepsis superimposed on dementia vs encephalopathy from underlying medical condition (thyroid, liver failure, etc).  CT head shows atrophy, no bleeding or stroke; given the time course of her mental status changes, I feel this is sensitive to rule out stroke.  Somewhat improved. She is making and sustaining eye contact. She will nod in answer to questions and tracking, but she is not taking PO.  TSH, ammonia, RPR, B12: Negative  Consult palliative care: They have met with the family on 10/22/2020 and 10/23/2020. They have spoken with some members of the family. Not all members were available. They were asked to talk to other children of the patient anD come back with a decision about comfort care and hospice. Son, [name] was at the bedside. By today no one had spoken with Tim.  He was here yesterday and was witness to the patient's nodding yes to eating. However, when attempts were made to feed her she sealed her lips closed. When it was possible to spoon anything into her mouth, she  would just either hold it there or it would leak out of the side of her mouth. She did not know how to swallow. I explained to him that she is dementia. I discussed with him a feeding tube and the complications that they can add to a patient's final days. I explained to him comfort care. I told him that comfort care and hospice is the best that we can do for the patient now. I have encouraged him to speak with his family and for them to get back to [place] with a decision that they agree upon ASAP. Pending that discussion, if there are no reversible causes found, the recommendation will be for Hospice to enroll patient back to LTC at [facility].  Acute kidney injury on CKD stage IIIa: Creatinine peaked at 3.3, improved to 0.86 today. Monitor electrolytes, creatinine, and volume status. Continue IV fluids due to poor PO intake.   Metabolic acidosis: Improving with fluids  Hypertension: Blood pressure improved. The patient has been restarted on he home dose of carvedilol. Stress steroids have been discontinued. Her home lisinopril and HCTZ are still held due to renal insufficiency. Complicated by patient's refusal to take PO meds.  Diabetes: Pt refusing to eat. Glucoses are followed with FSBS and SSI.  Moderate protein calorie malnutrition: Pt is at high risk of worsening nutritional status as she is refusing to eat. Much will depend upon the outcome of palliative care discussions. Reduced muscle mass and fat.  Poor oral intake.  Chronic disease.  Stage II buttocks ulcer, present on admission: Wound care.  Anemia of CKD, irone deficiency: Continue iron.  I have seen and examined this patient myself. I have discussed the patient in detail with her sone Tim. I have spent 48 minutes in the examination and care of this compllicated patient. More than 50% of that was spent in coordination of care.  DVT prophylaxis: heparin injection 5,000 Units Start: 10/18/20 0600  SCDs Start: 10/18/20 0437 Code  Status: DNR Family Communication: None available Disposition: Status is: Inpatient  Remains inpatient appropriate because:Altered mental status  Dispo: The patient is from: SNF  Anticipated d/c is to: SNF  Patient currently is not medically stable to d/c.              Difficult to place patient No  Minh Jasper, DO [facility] Direct contact: see [email or web address]  7PM-7AM contact night coverage as above 10/25/2020, 4:04 PM  LOS: 5 days

## 2020-10-26 DIAGNOSIS — A419 Sepsis, unspecified organism: Secondary | ICD-10-CM | POA: Diagnosis not present

## 2020-10-26 DIAGNOSIS — R4182 Altered mental status, unspecified: Secondary | ICD-10-CM | POA: Diagnosis not present

## 2020-10-26 DIAGNOSIS — E870 Hyperosmolality and hypernatremia: Secondary | ICD-10-CM

## 2020-10-26 DIAGNOSIS — N179 Acute kidney failure, unspecified: Secondary | ICD-10-CM | POA: Diagnosis not present

## 2020-10-26 DIAGNOSIS — J690 Pneumonitis due to inhalation of food and vomit: Secondary | ICD-10-CM | POA: Diagnosis not present

## 2020-10-26 LAB — BASIC METABOLIC PANEL
Anion gap: 10 (ref 5–15)
BUN: 21 mg/dL (ref 8–23)
CO2: 21 mmol/L — ABNORMAL LOW (ref 22–32)
Calcium: 8.1 mg/dL — ABNORMAL LOW (ref 8.9–10.3)
Chloride: 121 mmol/L — ABNORMAL HIGH (ref 98–111)
Creatinine, Ser: 0.79 mg/dL (ref 0.44–1.00)
GFR, Estimated: >60 mL/min (ref >60)
Glucose, Bld: 74 mg/dL (ref 70–99)
Potassium: 4.4 mmol/L (ref 3.5–5.1)
Sodium: 152 mmol/L — ABNORMAL HIGH (ref 135–145)

## 2020-10-26 LAB — MRSA PCR SCREENING: MRSA by PCR: NEGATIVE

## 2020-10-26 LAB — GLUCOSE, CAPILLARY
Glucose-Capillary: 136 mg/dL — ABNORMAL HIGH (ref 70–99)
Glucose-Capillary: 145 mg/dL — ABNORMAL HIGH (ref 70–99)
Glucose-Capillary: 153 mg/dL — ABNORMAL HIGH (ref 70–99)
Glucose-Capillary: 67 mg/dL — ABNORMAL LOW (ref 70–99)
Glucose-Capillary: 74 mg/dL (ref 70–99)
Glucose-Capillary: 76 mg/dL (ref 70–99)
Glucose-Capillary: 80 mg/dL (ref 70–99)

## 2020-10-26 MED ORDER — POTASSIUM CL IN DEXTROSE 5% 20 MEQ/L IV SOLN
20.0000 meq | INTRAVENOUS | Status: DC
  Administered 2020-10-27 – 2020-10-28 (×2): 20 meq via INTRAVENOUS
  Filled 2020-10-26 (×3): qty 1000

## 2020-10-26 MED ORDER — POTASSIUM CL IN DEXTROSE 5% 20 MEQ/L IV SOLN
20.0000 meq | INTRAVENOUS | Status: DC
  Administered 2020-10-26: 20 meq via INTRAVENOUS
  Filled 2020-10-26 (×2): qty 1000

## 2020-10-26 MED ORDER — DEXTROSE 50 % IV SOLN
INTRAVENOUS | Status: AC
  Filled 2020-10-26: qty 50

## 2020-10-26 MED ORDER — DEXTROSE 50 % IV SOLN
12.5000 g | INTRAVENOUS | Status: AC
  Administered 2020-10-26: 12.5 g via INTRAVENOUS

## 2020-10-26 NOTE — Care Management Important Message (Signed)
Important Message  Patient Details  Name: Kim Roy MRN: [id number] Date of Birth: [birth year]   Medicare Important Message Given:  Yes     [name] 10/26/2020, 12:19 PM

## 2020-10-26 NOTE — Progress Notes (Signed)
Hypoglycemic Event  CBG: 67   Treatment: 12.5 dextrose & attempted to give 4 oz of juice  Symptoms: None No changes in Neuro status   Follow-up CBG: Time:1205 CBG Result:80  Possible Reasons for Event: Poor PO intake   Comments/MD notified: Dr. Emiliano Dyer

## 2020-10-26 NOTE — Progress Notes (Signed)
PROGRESS NOTE    Kim Roy  AVW:[id number] DOB: 04/24/1931 DOA: 10/18/2020 [facility]   Chief complaint: altered mental status. Brief Narrative:  Mrs. Kim EaringJohnsonis a 87 y.o.Fwith dementia, living in SNF, DM, HTN, and CKD IIIa who [facility] with hypotension and decreased mentation.  In the ER, BP 60/30 and started on Fluids. She was admitted to the ICU. Found to have Lactate 7.5, leukocytosis, hypothermia and started on pressors and antibiotics. CT of the chest and abdomen showed aspiration pneumonia and possible choledocholithiasis.    Assessment & Plan:   Principal Problem:   Altered mental status Active Problems:   Type II diabetes mellitus with renal manifestations (HCC)   Septic shock (HCC)   Aspiration pneumonia of right lung (HCC)   Acute renal failure (HCC)   Hypoglycemia   Choledocholithiasis   Anemia   Pressure injury of skin   Protein-calorie malnutrition, severe   Palliative care by specialist   Goals of care, counseling/discussion  #1. Acute metabolic encephalopathy superimposed on chronic dementia. Severe hypernatremia. Hypokalemia Failure to thrive Anorexia. Patient has very poor appetite, he refused to eat.  She appeared to be dehydrated.  She has a severe hypernatremia. Patient has been seen by palliative care, family has not decided on comfort care.  At this point, I will start D5 water with added potassium. Recheck a BMP and magnesium in the morning.  2.  Septic shock secondary to aspiration pneumonia. Aspiration pneumonia Patient is on Unasyn.  3.  Acute kidney injury on chronic kidney disease stage IIIa. Metabolic acidosis. Renal function had improved.  4.  Type 2 diabetes with hyperglycemia. Patient glucose running low due to poor p.o. intake.  Discontinued sliding scale insulin.  5.  Moderate protein calorie malnutrition.  6.  Anemia of chronic kidney disease.  Patient  has very poor prognosis, she has a severe anorexia, she is not eating or drinking.  She has significant dehydration with hypernatremia.  Continue discussion with family about comfort care versus aggressive treatment.    DVT prophylaxis: Heparin Code Status: DNR Family Communication:  Disposition Plan:  .   Status is: Inpatient  Remains inpatient appropriate because:Inpatient level of care appropriate due to severity of illness   Dispo: The patient is from: Home              Anticipated d/c is to: Uncertain              Patient currently is not medically stable to d/c.   Difficult to place patient No        I/O last 3 completed shifts: In: 700 [IV Piggyback:700] Out: 150 [Urine:150] Total I/O In: 240 [P.O.:240] Out: -      Consultants:   Palliative care.  Procedures: None  Antimicrobials:  Unasyn  Subjective: Patient still confused, not talking.  She has very poor appetite, she is not eating or drinking.  Her glucose running low. She does not have any short of breath or cough. No abdominal pain or nausea vomiting. No fever or chills.  Objective: Vitals:   10/26/20 0454 10/26/20 0500 10/26/20 0815 10/26/20 1147  BP: (!) 141/91  (!) 153/66 (!) 152/72  Pulse: (!) 57  (!) 56 (!) 56  Resp: 18  20 16   Temp: 97.9 F (36.6 C)  97.6 F (36.4 C) 98 F (36.7 C)  TempSrc:      SpO2: 99%  100% 98%  Weight:  60.2 kg  Height:        Intake/Output Summary (Last 24 hours) at 10/26/2020 1310 Last data filed at 10/26/2020 1013 Gross per 24 hour  Intake 940 ml  Output 100 ml  Net 840 ml   Filed Weights   10/23/20 0434 10/24/20 0519 10/26/20 0500  Weight: 59.4 kg 59.9 kg 60.2 kg    Examination:  General exam: Appears calm and comfortable, appear dehydrated and malnourished Respiratory system: Clear to auscultation. Respiratory effort normal. Cardiovascular system: S1 & S2 heard, RRR. No JVD, murmurs, rubs, gallops or clicks. No pedal edema. Gastrointestinal  system: Abdomen is nondistended, soft and nontender. No organomegaly or masses felt. Normal bowel sounds heard. Central nervous system: Alert and oriented x1. No focal neurological deficits. Extremities: Symmetric 5 x 5 power. Skin: No rashes, lesions or ulcers     Data Reviewed: I have personally reviewed following labs and imaging studies  CBC: Recent Labs  Lab 10/21/20 0443 10/25/20 0507  WBC 3.9* 3.7*  NEUTROABS  --  2.7  HGB 9.4* 8.3*  HCT 29.0* 26.3*  MCV 89.0 90.1  PLT 141* 149*   Basic Metabolic Panel: Recent Labs  Lab 10/19/20 1542 10/20/20 0716 10/21/20 0443 10/25/20 0507 10/25/20 1557 10/26/20 0817  NA  --  140 143 151* 150* 152*  K  --  4.0 3.8 2.3* 3.2* 4.4  CL  --  113* 116* 118* 118* 121*  CO2  --  17* 18* 24 23 21*  GLUCOSE  --  106* 91 109* 91 74  BUN  --  58* 44* 26* 23 21  CREATININE  --  3.24* 2.33* 0.86 0.83 0.79  CALCIUM  --  8.2* 8.3* 8.2* 8.3* 8.1*  MG 2.5*  --   --  1.4* 1.4*  --   PHOS  --  3.5  --   --   --   --    GFR: Estimated Creatinine Clearance: 44.6 mL/min (by C-G formula based on SCr of 0.79 mg/dL). Liver Function Tests: Recent Labs  Lab 10/19/20 1542 10/20/20 0716 10/21/20 0443  AST 14* 14* 16  ALT 9 8 8   ALKPHOS 33* 29* 32*  BILITOT 0.8 0.7 0.9  PROT 4.8* 4.6* 4.5*  ALBUMIN 2.3* 2.2* 2.1*   No results for input(s): LIPASE, AMYLASE in the last 168 hours. Recent Labs  Lab 10/21/20 1054  AMMONIA 11   Coagulation Profile: No results for input(s): INR, PROTIME in the last 168 hours. Cardiac Enzymes: No results for input(s): CKTOTAL, CKMB, CKMBINDEX, TROPONINI in the last 168 hours. BNP (last 3 results) No results for input(s): PROBNP in the last 8760 hours. HbA1C: No results for input(s): HGBA1C in the last 72 hours. CBG: Recent Labs  Lab 10/25/20 2344 10/26/20 0504 10/26/20 0752 10/26/20 1137 10/26/20 1204  GLUCAP 78 76 74 67* 80   Lipid Profile: No results for input(s): CHOL, HDL, LDLCALC, TRIG,  CHOLHDL, LDLDIRECT in the last 72 hours. Thyroid Function Tests: No results for input(s): TSH, T4TOTAL, FREET4, T3FREE, THYROIDAB in the last 72 hours. Anemia Panel: No results for input(s): VITAMINB12, FOLATE, FERRITIN, TIBC, IRON, RETICCTPCT in the last 72 hours. Sepsis Labs: Recent Labs  Lab 10/20/20 0716  PROCALCITON 0.63    Recent Results (from the past 240 hour(s))  Culture, blood (Routine X 2) w Reflex to ID Panel     Status: None   Collection Time: 10/18/20  1:50 AM   Specimen: BLOOD  Result Value Ref Range Status   Specimen Description BLOOD BLOOD RIGHT FOREARM  Final   Special Requests   Final    BOTTLES DRAWN AEROBIC AND ANAEROBIC Blood Culture adequate volume   Culture   Final    NO GROWTH 5 DAYS Performed at [facility], [address] [place], [place] [address]    Report Status 10/23/2020 FINAL  Final  Culture, blood (Routine X 2) w Reflex to ID Panel     Status: None   Collection Time: 10/18/20  1:50 AM   Specimen: BLOOD  Result Value Ref Range Status   Specimen Description BLOOD BLOOD RIGHT FOREARM  Final   Special Requests   Final    BOTTLES DRAWN AEROBIC AND ANAEROBIC Blood Culture adequate volume   Culture   Final    NO GROWTH 5 DAYS Performed at [facility], [address] [place], [place] [address]    Report Status 10/23/2020 FINAL  Final  Urine Culture     Status: None   Collection Time: 10/18/20  1:50 AM   Specimen: Urine, Random  Result Value Ref Range Status   Specimen Description   Final    URINE, RANDOM Performed at [facility], [address], [place], [place] [address]    Special Requests   Final    NONE Performed at [facility], [address], [place], [place] [address]    Culture   Final    NO GROWTH Performed at [facility], [address], [place], [place] [address]    Report Status 10/19/2020 FINAL  Final  Resp Panel by RT-PCR (Flu A&B, Covid) Nasopharyngeal Swab     Status:  None   Collection Time: 10/18/20  3:04 AM   Specimen: Nasopharyngeal Swab; Nasopharyngeal(NP) swabs in vial transport medium  Result Value Ref Range Status   SARS Coronavirus 2 by RT PCR NEGATIVE NEGATIVE Final    Comment: (NOTE) SARS-CoV-2 target nucleic acids are NOT DETECTED.  The SARS-CoV-2 RNA is generally detectable in upper respiratory specimens during the acute phase of infection. The lowest concentration of SARS-CoV-2 viral copies this assay can detect is 138 copies/mL. A negative result does not preclude SARS-Cov-2 infection and should not be used as the sole basis for treatment or other patient management decisions. A negative result may occur with  improper specimen collection/handling, submission of specimen other than nasopharyngeal swab, presence of viral mutation(s) within the areas targeted by this assay, and inadequate number of viral copies(<138 copies/mL). A negative result must be combined with clinical observations, patient history, and epidemiological information. The expected result is Negative.  Fact Sheet for Patients:  BloggerCourse.com  Fact Sheet for Healthcare Providers:  SeriousBroker.it  This test is no t yet approved or cleared by the [place] FDA and  has been authorized for detection and/or diagnosis of SARS-CoV-2 by FDA under an Emergency Use Authorization (EUA). This EUA will remain  in effect (meaning this test can be used) for the duration of the COVID-19 declaration under Section 564(b)(1) of the Act, 21 U.S.C.section 360bbb-3(b)(1), unless the authorization is terminated  or revoked sooner.       Influenza A by PCR NEGATIVE NEGATIVE Final   Influenza B by PCR NEGATIVE NEGATIVE Final    Comment: (NOTE) The Xpert Xpress SARS-CoV-2/FLU/RSV plus assay is intended as an aid in the diagnosis of influenza from Nasopharyngeal swab specimens and should not be used as a sole basis for  treatment. Nasal washings and aspirates are unacceptable for Xpert Xpress SARS-CoV-2/FLU/RSV testing.  Fact Sheet for Patients: BloggerCourse.com  Fact Sheet for Healthcare Providers: SeriousBroker.it  This test is not yet approved or cleared by the [place] FDA and has been authorized for detection and/or diagnosis of SARS-CoV-2 by FDA under an Emergency Use Authorization (EUA). This EUA will remain in effect (meaning this test can be used) for the duration of the COVID-19 declaration under Section 564(b)(1) of the Act, 21 U.S.C. section 360bbb-3(b)(1), unless the authorization is terminated or revoked.  Performed at [facility] [address] [place], [place] [address]   MRSA PCR Screening     Status: None   Collection Time: 10/26/20  5:04 AM   Specimen: Nasal Mucosa; Nasopharyngeal  Result Value Ref Range Status   MRSA by PCR NEGATIVE NEGATIVE Final    Comment:        The GeneXpert MRSA Assay (FDA approved for NASAL specimens only), is one component of a comprehensive MRSA colonization surveillance program. It is not intended to diagnose MRSA infection nor to guide or monitor treatment for MRSA infections. Performed at [facility], [address], [place], [place] [address]          Radiology Studies: No results found.      Scheduled Meds: . dextrose      . aspirin  81 mg Oral Daily  . atorvastatin  40 mg Oral Daily  . carvedilol  6.25 mg Oral BID WC  . cholecalciferol  1,000 Units Oral Daily  . feeding supplement  237 mL Oral BID BM  . heparin  5,000 Units Subcutaneous Q8H  . insulin aspart  0-9 Units Subcutaneous Q4H  . iron polysaccharides  150 mg Oral Daily  . mouth rinse  15 mL Mouth Rinse BID  . multivitamin with minerals  1 tablet Oral Daily   Continuous Infusions: . sodium chloride    . ampicillin-sulbactam (UNASYN) IV 3 g (10/26/20 1154)     LOS: 8 days     Time spent: 32 minutes    Marrion Coy, MD [facility]   To contact the attending provider between 7A-7P or the covering provider during after hours 7P-7A, please log into the web site [email or web address] and access using universal Bee password for that web site. If you do not have the password, please call the hospital operator.  10/26/2020, 1:10 PM

## 2020-10-26 NOTE — Progress Notes (Signed)
Nutrition Follow-up  DOCUMENTATION CODES:   Severe malnutrition in context of chronic illness  INTERVENTION:  D/c Ensure d/t poor acceptance  Continue Magic cup TID with meals, each supplement provides 290 kcal and 9 grams of protein  Continue MVI daily  Will monitor outcome of ongoing discussions regarding goals of care   NUTRITION DIAGNOSIS:   Severe Malnutrition related to chronic illness (CKD, suspected inadequate oral intake) as evidenced by severe fat depletion,moderate muscle depletion,severe muscle depletion. -ongoing  GOAL:   Patient will meet greater than or equal to 90% of their needs -unmet  MONITOR:   PO intake,Supplement acceptance,Labs,Weight trends,Skin,I & O's  REASON FOR ASSESSMENT:   Malnutrition Screening Tool,Consult Assessment of nutrition requirement/status  ASSESSMENT:   87 year old female with PMHx of DM, HTN, gout, CAD, vitamin D deficiency admitted with severe sepsis with septic shock due to aspiration PNA, choledocholithiasis, AKI on CKD stage III.  Pt with ongoing poor appetite and intake, eating 0-50% (18% average) of the last 6 documented meals from 3/4-3/7. She has refused the last 13/13 Ensure, will discontinue. Pt refusing all po medications today per chart. Noted very poor prognosis, not eating/drinking, significant dehydration with hypernatremia. Palliative following, ongoing discussions with family about comfort care verses aggressive treatment. VM left for son today, will continue to monitor and provide additional nutrition recommendations as appropriate.   Weight 60.2 kg on 3/7 down 2.4 kg (~5lbs) from 62.6 kg on 3/02; significant 3.8%  Medications reviewed and include: D3, Niferex, MVI  IVF: D5 with KCl 20 mEq  Labs: CBGs 401,02,72,53, Na 152 (H)   Diet Order:   Diet Order            DIET - DYS 1 Room service appropriate? Yes; Fluid consistency: Thin  Diet effective now                 EDUCATION NEEDS:   No  education needs have been identified at this time  Skin:  Skin Assessment: Skin Integrity Issues: Skin Integrity Issues:: Stage II Stage II: buttocks (2cm x 0.2cm)  Last BM:  3/6  Height:   Ht Readings from Last 1 Encounters:  10/18/20 5\' 5"  (1.651 m)    Weight:   Wt Readings from Last 1 Encounters:  10/26/20 60.2 kg    BMI:  Body mass index is 22.09 kg/m.  Estimated Nutritional Needs:   Kcal:  1450-1650  Protein:  75-85 grams  Fluid:  1.4-1.6 L/day   12/26/20, RD, LDN Clinical Nutrition After Hours/Weekend Pager # in Amion

## 2020-10-26 NOTE — Progress Notes (Signed)
PMT provider chart reviewed. Family has not yet made decision regarding goals for feeding and/or comfort/hospice. VM left for son, Alinda Money requesting return call to discuss plan of care.  NO CHARGE  Vennie Homans, DNP, FNP-C Palliative Medicine Team  Phone: [phone] Fax: [phone]

## 2020-10-27 DIAGNOSIS — F0390 Unspecified dementia without behavioral disturbance: Secondary | ICD-10-CM

## 2020-10-27 DIAGNOSIS — R4182 Altered mental status, unspecified: Secondary | ICD-10-CM | POA: Diagnosis not present

## 2020-10-27 DIAGNOSIS — E870 Hyperosmolality and hypernatremia: Secondary | ICD-10-CM

## 2020-10-27 DIAGNOSIS — Z7189 Other specified counseling: Secondary | ICD-10-CM | POA: Diagnosis not present

## 2020-10-27 DIAGNOSIS — J690 Pneumonitis due to inhalation of food and vomit: Secondary | ICD-10-CM | POA: Diagnosis not present

## 2020-10-27 LAB — BASIC METABOLIC PANEL
Anion gap: 4 — ABNORMAL LOW (ref 5–15)
BUN: 16 mg/dL (ref 8–23)
CO2: 28 mmol/L (ref 22–32)
Calcium: 7.8 mg/dL — ABNORMAL LOW (ref 8.9–10.3)
Chloride: 116 mmol/L — ABNORMAL HIGH (ref 98–111)
Creatinine, Ser: 0.51 mg/dL (ref 0.44–1.00)
GFR, Estimated: >60 mL/min (ref >60)
Glucose, Bld: 156 mg/dL — ABNORMAL HIGH (ref 70–99)
Potassium: 3.2 mmol/L — ABNORMAL LOW (ref 3.5–5.1)
Sodium: 148 mmol/L — ABNORMAL HIGH (ref 135–145)

## 2020-10-27 LAB — MAGNESIUM: Magnesium: 1.4 mg/dL — ABNORMAL LOW (ref 1.7–2.4)

## 2020-10-27 LAB — GLUCOSE, CAPILLARY
Glucose-Capillary: 125 mg/dL — ABNORMAL HIGH (ref 70–99)
Glucose-Capillary: 134 mg/dL — ABNORMAL HIGH (ref 70–99)
Glucose-Capillary: 141 mg/dL — ABNORMAL HIGH (ref 70–99)
Glucose-Capillary: 148 mg/dL — ABNORMAL HIGH (ref 70–99)
Glucose-Capillary: 158 mg/dL — ABNORMAL HIGH (ref 70–99)
Glucose-Capillary: 159 mg/dL — ABNORMAL HIGH (ref 70–99)

## 2020-10-27 MED ORDER — LISINOPRIL 20 MG PO TABS
10.0000 mg | ORAL_TABLET | Freq: Every day | ORAL | Status: DC
  Administered 2020-10-28: 10 mg via ORAL
  Filled 2020-10-27: qty 1

## 2020-10-27 MED ORDER — MAGNESIUM SULFATE 2 GM/50ML IV SOLN
2.0000 g | Freq: Once | INTRAVENOUS | Status: AC
  Administered 2020-10-27: 2 g via INTRAVENOUS
  Filled 2020-10-27: qty 50

## 2020-10-27 MED ORDER — LACTULOSE 10 GM/15ML PO SOLN
20.0000 g | Freq: Once | ORAL | Status: AC
  Administered 2020-10-27: 20 g via ORAL
  Filled 2020-10-27: qty 30

## 2020-10-27 MED ORDER — POTASSIUM CHLORIDE 10 MEQ/100ML IV SOLN
10.0000 meq | INTRAVENOUS | Status: AC
  Administered 2020-10-27 (×2): 10 meq via INTRAVENOUS
  Filled 2020-10-27 (×2): qty 100

## 2020-10-27 MED ORDER — MIRTAZAPINE 7.5 MG PO TABS
7.5000 mg | ORAL_TABLET | Freq: Every day | ORAL | Status: DC
  Administered 2020-10-27: 7.5 mg via ORAL
  Filled 2020-10-27: qty 1

## 2020-10-27 MED ORDER — ENOXAPARIN SODIUM 40 MG/0.4ML ~~LOC~~ SOLN
40.0000 mg | SUBCUTANEOUS | Status: DC
  Administered 2020-10-27: 40 mg via SUBCUTANEOUS
  Filled 2020-10-27: qty 0.4

## 2020-10-27 NOTE — Progress Notes (Signed)
Mobility Specialist - Progress Note   10/27/20 1700  Mobility  Activity Refused mobility  Mobility performed by Mobility specialist    Pt eating supper with assistance at time of arrival. Pt non-verbal, but does respond to yes/no questions. Will attempt session at another date/time.    Kim Roy Mobility Specialist 10/27/20, 5:42 PM

## 2020-10-27 NOTE — Progress Notes (Signed)
Daily Progress Note   Patient Name: Kim Roy       Date: 10/27/2020 DOB: [birth year]  Age: 87 y.o. MRN#: [id number] Attending Physician: Marrion Coy, MD Primary Care Physician: [facility] Admit Date: 10/18/2020  Reason for Consultation/Follow-up: Establishing goals of care  Subjective: Patient will wake to voice. Minimally verbal but will state her name today. Does not engage in discussion. Does not appear to be in pain or discomfort.    GOC:  No family at bedside. Received call from DIL, Kim Roy this AM. Hipolito Bayley on speaker phone. Kim Roy shares that the family has made decision against feeding tube placement, understanding this will not reverse her dementia or impact her quality of life. Kim Roy shares that her and Kim Roy were at bedside this morning and glad to see that Ms. Kim Roy accepted bites and sips for them during visit. They do remain hopeful she will continue to improve. We did discuss that she may return back to her familiar environment at SNF and do better for longer, but prognosis really depends on her oral intake. Discussed disease trajectory and expectations of dementia. Discussed comfort feeds, dysphagia diet, assist/encouragement with meals, aspiration precautions. Discussed high risk for recurrent hospitalization.   Discussed outpatient palliative vs. Hospice options at long-term care facility. Educated on hospice philosophy including goals for comfort, quality of life, allowing nature to take course, symptom management, and preventing recurrent hospitalization. Cleda Daub agree with initiation of hospice services at [facility] on discharge. Explained TOC involvement to coordinate initiation of services at SNF.  Answered questions. Kim Roy and  Kim Roy will likely visit bedside again this afternoon.    Length of Stay: 9  Current Medications: Scheduled Meds:  . aspirin  81 mg Oral Daily  . atorvastatin  40 mg Oral Daily  . carvedilol  6.25 mg Oral BID WC  . cholecalciferol  1,000 Units Oral Daily  . heparin  5,000 Units Subcutaneous Q8H  . iron polysaccharides  150 mg Oral Daily  . mouth rinse  15 mL Mouth Rinse BID  . multivitamin with minerals  1 tablet Oral Daily    Continuous Infusions: . sodium chloride    . dextrose 5 % with KCl 20 mEq / L 50 mL/hr at 10/26/20 2346    PRN Meds: sodium chloride, docusate sodium,  polyethylene glycol  Physical Exam Vitals and nursing note reviewed.  Constitutional:      Appearance: She is cachectic. She is ill-appearing.  HENT:     Head: Normocephalic and atraumatic.  Pulmonary:     Effort: No tachypnea, accessory muscle usage or respiratory distress.  Skin:    General: Skin is warm and dry.  Neurological:     Mental Status: She is easily aroused.     Comments: Wakes to verse, minimally engaged but will state her name  Psychiatric:        Attention and Perception: She is inattentive.        Speech: Speech is delayed.        Cognition and Memory: Cognition is impaired.            Vital Signs: BP (!) 162/66 (BP Location: Left Arm)   Pulse (!) 53   Temp 97.9 F (36.6 C) (Oral)   Resp 18   Ht 5\' 5"  (1.651 m)   Wt 61 kg   SpO2 98%   BMI 22.38 kg/m  SpO2: SpO2: 98 % O2 Device: O2 Device: Room Air O2 Flow Rate:    Intake/output summary:   Intake/Output Summary (Last 24 hours) at 10/27/2020 12/27/2020 Last data filed at 10/27/2020 0300 Gross per 24 hour  Intake 1657.47 ml  Output 400 ml  Net 1257.47 ml   LBM: Last BM Date:  (nothing documented) Baseline Weight: Weight: 56.7 kg Most recent weight: Weight: 61 kg       Palliative Assessment/Data: PPS 20%    Flowsheet Rows   Flowsheet Row Most Recent Value  Intake Tab   Referral Department Hospitalist  Unit at Time  of Referral ICU  Palliative Care Primary Diagnosis Other (Comment)  Palliative Care Type New Palliative care  Reason for referral Clarify Goals of Care  Date first seen by Palliative Care 10/20/20  Clinical Assessment   Palliative Performance Scale Score 20%  Psychosocial & Spiritual Assessment   Palliative Care Outcomes   Patient/Family meeting held? Yes  Who was at the meeting? DIL 12/20/20)  Palliative Care Outcomes Clarified goals of care, Counseled regarding hospice, Provided psychosocial or spiritual support, ACP counseling assistance      Patient Active Problem List   Diagnosis Date Noted  . Hypernatremia 10/26/2020  . Pressure injury of skin 10/20/2020  . Protein-calorie malnutrition, severe 10/20/2020  . Palliative care by specialist   . Goals of care, counseling/discussion   . Altered mental status   . Hypoglycemia   . Choledocholithiasis   . Anemia   . Septic shock (HCC) 10/18/2020  . Acute renal failure (HCC) 10/18/2020  . Aspiration pneumonia of right lung (HCC)   . Syncope 05/01/2020  . UTI (urinary tract infection) 05/01/2020  . Acute metabolic encephalopathy 05/01/2020  . HLD (hyperlipidemia) 05/01/2020  . Hypokalemia 05/01/2020  . CKD (chronic kidney disease), stage IIIa 05/01/2020  . Closed left hip fracture (HCC) 01/19/2019  . Type II diabetes mellitus with renal manifestations (HCC) 01/19/2019  . HTN (hypertension) 01/19/2019    Palliative Care Assessment & Plan   Patient Profile: 87 y.o. female  with past medical history of DM, HTN, dyslipidemia, CKD stage IIIa admitted on 10/18/[facility] from [facility] with hypotension and AMS. Hospital admission for severe sepsis along with septic shock secondary to aspiration pneumonia initially requiring ICU admission for vasopressor support. CT abdomen reveals choledocholithiasis. GI following. No signs of cholangitis or obstruction, therefore recommending outpatient GI f/u for possible ERCP once sepsis  resolved. 3/1 off pressors, vitals stable but patient refusing medications and food. Repeat CT head 3/1. Palliative medicine consultation for goals of care.   Assessment: Severe sepsis with septic shock, resolved Aspiration pneumonia Altered mental status Probably dementia Choledocholithiasis AKI on CKD stage IIIa Protein calorie malnutrition, unspecified severity   Recommendations/Plan:  DNR, otherwise continue current plan of care and medical management.  F/u discussion with patient's son Kim Roy) and DIL Kim Roy). They confirm family decision against feeding tube placement.   Discussed plan for comfort feeds, ongoing assist and encouragement with meals, dysphagia diet, and aspiration precautions. Kim Roy and [place] visited bedside this AM and were glad that Donnae accepted bites and sips for them.   Discussed outpatient palliative vs. Hospice at LTC facility. Cleda Daub agree with medical recommendation for hospice services at [facility] on discharge. Notified TOC, attending, and RN.  Code Status: . DNR   Code Status Orders  (From admission, onward)         Start     Ordered   10/19/20 1253  Do not attempt resuscitation (DNR)  Continuous       Question Answer Comment  In the event of cardiac or respiratory ARREST Do not call a "code blue"   In the event of cardiac or respiratory ARREST Do not perform Intubation, CPR, defibrillation or ACLS   In the event of cardiac or respiratory ARREST Use medication by any route, position, wound care, and other measures to relive pain and suffering. May use oxygen, suction and manual treatment of airway obstruction as needed for comfort.   Comments Full scope of medical care      10/19/20 1253        Code Status History    Date Active Date Inactive Code Status Order ID Comments User Context   10/18/2020 0439 10/19/2020 1253 Full Code [id number]  Rust-ChesterCecelia Byars, NP ED   05/01/2020 1822 05/04/2020 2236 Full Code [id number]  Lorretta Harp, MD ED   01/20/2019 0727 01/23/2019 1658 Full Code [id number]  Oralia Manis, MD ED   Advance Care Planning Activity       Prognosis:  Poor long-term prognosis with progressive dementia  Discharge Planning:  Long-term care facility with hospice support  Care plan was discussed with son Kim Roy), DIL Kim Roy), Updated Dr. Chipper Herb, RN CM, RN  Thank you for allowing the Palliative Medicine Team to assist in the care of this patient.   Total Time 30 Prolonged Time Billed  no    Greater than 50% of this time was spent counseling and coordinating care related to the above assessment and plan.   Vennie Homans, DNP, FNP-C Palliative Medicine Team  Phone: [phone] Fax: [phone]  Please contact Palliative Medicine Team phone at [phone] for questions and concerns.

## 2020-10-27 NOTE — [place] FL2 (Signed)
[place] MEDICAID FL2 LEVEL OF CARE SCREENING TOOL     IDENTIFICATION  Patient Name: Kim Roy Birthdate: 10-06-1930 Sex: female Admission Date (Current Location): 10/18/2020  [facility] and [place] Number:  Chiropodist and Address:         Provider Number: [id number]  Attending Physician Name and Address:  Marrion Coy, MD  Relative Name and Phone Number:       Current Level of Care: Hospital Recommended Level of Care: Skilled Nursing Facility Prior Approval Number:    Date Approved/Denied:   PASRR Number:    Discharge Plan:      Current Diagnoses: Patient Active Problem List   Diagnosis Date Noted  . Hypernatremia 10/26/2020  . Pressure injury of skin 10/20/2020  . Protein-calorie malnutrition, severe 10/20/2020  . Palliative care by specialist   . Goals of care, counseling/discussion   . Altered mental status   . Hypoglycemia   . Choledocholithiasis   . Anemia   . Septic shock (HCC) 10/18/2020  . Acute renal failure (HCC) 10/18/2020  . Aspiration pneumonia of right lung (HCC)   . Syncope 05/01/2020  . UTI (urinary tract infection) 05/01/2020  . Acute metabolic encephalopathy 05/01/2020  . HLD (hyperlipidemia) 05/01/2020  . Hypokalemia 05/01/2020  . CKD (chronic kidney disease), stage IIIa 05/01/2020  . Closed left hip fracture (HCC) 01/19/2019  . Type II diabetes mellitus with renal manifestations (HCC) 01/19/2019  . HTN (hypertension) 01/19/2019    Orientation RESPIRATION BLADDER Height & Weight     Self,Place  Normal Incontinent,External catheter Weight: 61 kg Height:  5\' 5"  (165.1 cm)  BEHAVIORAL SYMPTOMS/MOOD NEUROLOGICAL BOWEL NUTRITION STATUS      Incontinent Diet (dys 1)  AMBULATORY STATUS COMMUNICATION OF NEEDS Skin   Total Care Verbally PU Stage and Appropriate Care                       Personal Care Assistance Level of Assistance              Functional Limitations Info             SPECIAL CARE  FACTORS FREQUENCY   (Hospice Services)                    Contractures Contractures Info: Not present    Additional Factors Info  Code Status,Allergies Code Status Info: DNR Allergies Info: NKDA           Current Medications (10/27/2020):  This is the current hospital active medication list Current Facility-Administered Medications  Medication Dose Route Frequency Provider Last Rate Last Admin  . 0.9 %  sodium chloride infusion   Intravenous PRN Danford, 12/27/2020, MD      . aspirin chewable tablet 81 mg  81 mg Oral Daily Earl Lites, MD   81 mg at 10/27/20 1045  . atorvastatin (LIPITOR) tablet 40 mg  40 mg Oral Daily 12/27/20, MD   40 mg at 10/27/20 1045  . carvedilol (COREG) tablet 6.25 mg  6.25 mg Oral BID WC Danford, 12/27/20, MD   6.25 mg at 10/27/20 1044  . cholecalciferol (VITAMIN D) tablet 1,000 Units  1,000 Units Oral Daily Amin, 12/27/20, MD      . dextrose 5 % with KCl 20 mEq / L  infusion  20 mEq Intravenous Continuous Tilman Neat, NP 50 mL/hr at 10/27/20 0941 20 mEq at 10/27/20 0941  . docusate sodium (COLACE) capsule 100 mg  100 mg  Oral BID PRN Arnetha Courser, MD      . heparin injection 5,000 Units  5,000 Units Subcutaneous Q8H Arnetha Courser, MD   5,000 Units at 10/27/20 1042  . iron polysaccharides (NIFEREX) capsule 150 mg  150 mg Oral Daily Arnetha Courser, MD      . lactulose (CHRONULAC) 10 GM/15ML solution 20 g  20 g Oral Once Marrion Coy, MD      . magnesium sulfate IVPB 2 g 50 mL  2 g Intravenous Once Marrion Coy, MD      . MEDLINE mouth rinse  15 mL Mouth Rinse BID Arnetha Courser, MD   15 mL at 10/27/20 1051  . multivitamin with minerals tablet 1 tablet  1 tablet Oral Daily Arnetha Courser, MD   1 tablet at 10/24/20 1032  . polyethylene glycol (MIRALAX / GLYCOLAX) packet 17 g  17 g Oral Daily PRN Arnetha Courser, MD      . potassium chloride 10 mEq in 100 mL IVPB  10 mEq Intravenous Q1 Hr x 2 Marrion Coy, MD         Discharge  Medications: Please see discharge summary for a list of discharge medications.  Relevant Imaging Results:  Relevant Lab Results:   Additional Information SS: [id number]  Chapman Fitch, RN

## 2020-10-27 NOTE — Progress Notes (Signed)
PROGRESS NOTE    Kim Roy  ENI:[id number] DOB: 1930-10-16 DOA: 10/18/2020 PCP: [facility]   Chief complaint.  Altered mental status. Brief Narrative:  Kim Roy a 87 y.o.Fwith dementia, living in SNF, DM, HTN, and CKD IIIa who presented from [facility] with hypotension and decreased mentation.  In the ER, BP 60/30 and started on Fluids. She was admitted to the ICU. Found to have Lactate 7.5, leukocytosis, hypothermia and started on pressors and antibiotics. CT of the chest and abdomen showed aspiration pneumonia and possible choledocholithiasis   Assessment & Plan:   Principal Problem:   Altered mental status Active Problems:   Type II diabetes mellitus with renal manifestations (HCC)   Septic shock (HCC)   Aspiration pneumonia of right lung (HCC)   Acute renal failure (HCC)   Hypoglycemia   Choledocholithiasis   Anemia   Pressure injury of skin   Protein-calorie malnutrition, severe   Palliative care by specialist   Goals of care, counseling/discussion   Hypernatremia  #1.  Acute metabolic encephalopathy superimposed on chronic dementia. Severe hypernatremia.  Hypokalemia. Hypomagnesemia. Failure to thrive. Anorexia. Patient still has a poor appetite, but was able to eat with family is around today.  She is receiving D5 water with added potassium, sodium level is better. Continue supplement potassium and magnesium.  2.  Septic shock secondary to aspiration pneumonia. Aspiration pneumonia. Patient has completed antibiotics.  3.  Acute kidney injury on chronic kidney disease stage IIIa. Metabolic acidosis per Condition has improved  4.  Type 2 diabetes  with hypoglycemia. Glucose is better on D5 water.  5.  Anemia of chronic kidney disease.  #6.  Pancytopenia. Follow.  Recheck CBC tomorrow.     DVT prophylaxis: Heparin Code Status: DNR Family Communication: Was able to get hold of family, decision  was made to send patient to nursing home with hospice care. Disposition Plan:  .   Status is: Inpatient  Remains inpatient appropriate because:Inpatient level of care appropriate due to severity of illness   Dispo: The patient is from: Home              Anticipated d/c is to: SNF              Patient currently is not medically stable to d/c.   Difficult to place patient No        I/O last 3 completed shifts: In: 2357.5 [P.O.:600; I.V.:786.3; IV Piggyback:971.2] Out: 500 [Urine:500] No intake/output data recorded.     Consultants:   None  Procedures: None  Antimicrobials: None  Subjective: Patient still has a poor appetite, but able to eat small amount of meal today. No nausea vomiting abdominal pain. No dyspnea or cough. No fever or chills.   Objective: Vitals:   10/27/20 0500 10/27/20 0800 10/27/20 1044 10/27/20 1155  BP:  (!) 162/66  (!) 166/70  Pulse:  (!) 53 64 (!) 58  Resp:  18  16  Temp:  97.9 F (36.6 C)  97.6 F (36.4 C)  TempSrc:  Oral  Oral  SpO2:  98%  99%  Weight: 61 kg     Height:        Intake/Output Summary (Last 24 hours) at 10/27/2020 1319 Last data filed at 10/27/2020 0300 Gross per 24 hour  Intake 1417.47 ml  Output 400 ml  Net 1017.47 ml   Filed Weights   10/24/20 0519 10/26/20 0500 10/27/20 0500  Weight: 59.9 kg 60.2 kg 61 kg  Examination:  General exam: Appears calm and comfortable  Respiratory system: Clear to auscultation. Respiratory effort normal. Cardiovascular system: S1 & S2 heard, RRR. No JVD, murmurs, rubs, gallops or clicks. No pedal edema. Gastrointestinal system: Abdomen is nondistended, soft and nontender. No organomegaly or masses felt. Normal bowel sounds heard. Central nervous system: Alert and oriented x1.  No focal neurological deficits. Extremities: Symmetric 5 x 5 power. Skin: No rashes, lesions or ulcers     Data Reviewed: I have personally reviewed following labs and imaging  studies  CBC: Recent Labs  Lab 10/21/20 0443 10/25/20 0507  WBC 3.9* 3.7*  NEUTROABS  --  2.7  HGB 9.4* 8.3*  HCT 29.0* 26.3*  MCV 89.0 90.1  PLT 141* 149*   Basic Metabolic Panel: Recent Labs  Lab 10/21/20 0443 10/25/20 0507 10/25/20 1557 10/26/20 0817 10/27/20 0723  NA 143 151* 150* 152* 148*  K 3.8 2.3* 3.2* 4.4 3.2*  CL 116* 118* 118* 121* 116*  CO2 18* 24 23 21* 28  GLUCOSE 91 109* 91 74 156*  BUN 44* 26* 23 21 16   CREATININE 2.33* 0.86 0.83 0.79 0.51  CALCIUM 8.3* 8.2* 8.3* 8.1* 7.8*  MG  --  1.4* 1.4*  --  1.4*   GFR: Estimated Creatinine Clearance: 44.6 mL/min (by C-G formula based on SCr of 0.51 mg/dL). Liver Function Tests: Recent Labs  Lab 10/21/20 0443  AST 16  ALT 8  ALKPHOS 32*  BILITOT 0.9  PROT 4.5*  ALBUMIN 2.1*   No results for input(s): LIPASE, AMYLASE in the last 168 hours. Recent Labs  Lab 10/21/20 1054  AMMONIA 11   Coagulation Profile: No results for input(s): INR, PROTIME in the last 168 hours. Cardiac Enzymes: No results for input(s): CKTOTAL, CKMB, CKMBINDEX, TROPONINI in the last 168 hours. BNP (last 3 results) No results for input(s): PROBNP in the last 8760 hours. HbA1C: No results for input(s): HGBA1C in the last 72 hours. CBG: Recent Labs  Lab 10/26/20 2036 10/26/20 2333 10/27/20 0413 10/27/20 0811 10/27/20 1157  GLUCAP 145* 153* 141* 134* 158*   Lipid Profile: No results for input(s): CHOL, HDL, LDLCALC, TRIG, CHOLHDL, LDLDIRECT in the last 72 hours. Thyroid Function Tests: No results for input(s): TSH, T4TOTAL, FREET4, T3FREE, THYROIDAB in the last 72 hours. Anemia Panel: No results for input(s): VITAMINB12, FOLATE, FERRITIN, TIBC, IRON, RETICCTPCT in the last 72 hours. Sepsis Labs: No results for input(s): PROCALCITON, LATICACIDVEN in the last 168 hours.  Recent Results (from the past 240 hour(s))  Culture, blood (Routine X 2) w Reflex to ID Panel     Status: None   Collection Time: 10/18/20  1:50 AM    Specimen: BLOOD  Result Value Ref Range Status   Specimen Description BLOOD BLOOD RIGHT FOREARM  Final   Special Requests   Final    BOTTLES DRAWN AEROBIC AND ANAEROBIC Blood Culture adequate volume   Culture   Final    NO GROWTH 5 DAYS Performed at [facility], [address] [place], [place] [address]    Report Status 10/23/2020 FINAL  Final  Culture, blood (Routine X 2) w Reflex to ID Panel     Status: None   Collection Time: 10/18/20  1:50 AM   Specimen: BLOOD  Result Value Ref Range Status   Specimen Description BLOOD BLOOD RIGHT FOREARM  Final   Special Requests   Final    BOTTLES DRAWN AEROBIC AND ANAEROBIC Blood Culture adequate volume   Culture   Final  NO GROWTH 5 DAYS Performed at [facility], [address] [place], [place] [address]    Report Status 10/23/2020 FINAL  Final  Urine Culture     Status: None   Collection Time: 10/18/20  1:50 AM   Specimen: Urine, Random  Result Value Ref Range Status   Specimen Description   Final    URINE, RANDOM Performed at [facility], [address] [place], [place] [address]    Special Requests   Final    NONE Performed at [facility], [address] [place], [place] [address]    Culture   Final    NO GROWTH Performed at [facility], [address], [place], [place] [address]    Report Status 10/19/2020 FINAL  Final  Resp Panel by RT-PCR (Flu A&B, Covid) Nasopharyngeal Swab     Status: None   Collection Time: 10/18/20  3:04 AM   Specimen: Nasopharyngeal Swab; Nasopharyngeal(NP) swabs in vial transport medium  Result Value Ref Range Status   SARS Coronavirus 2 by RT PCR NEGATIVE NEGATIVE Final    Comment: (NOTE) SARS-CoV-2 target nucleic acids are NOT DETECTED.  The SARS-CoV-2 RNA is generally detectable in upper respiratory specimens during the acute phase of infection. The lowest concentration of SARS-CoV-2 viral copies this assay can detect is 138 copies/mL. A  negative result does not preclude SARS-Cov-2 infection and should not be used as the sole basis for treatment or other patient management decisions. A negative result may occur with  improper specimen collection/handling, submission of specimen other than nasopharyngeal swab, presence of viral mutation(s) within the areas targeted by this assay, and inadequate number of viral copies(<138 copies/mL). A negative result must be combined with clinical observations, patient history, and epidemiological information. The expected result is Negative.  Fact Sheet for Patients:  BloggerCourse.com  Fact Sheet for Healthcare Providers:  SeriousBroker.it  This test is no t yet approved or cleared by the [place] FDA and  has been authorized for detection and/or diagnosis of SARS-CoV-2 by FDA under an Emergency Use Authorization (EUA). This EUA will remain  in effect (meaning this test can be used) for the duration of the COVID-19 declaration under Section 564(b)(1) of the Act, 21 U.S.C.section 360bbb-3(b)(1), unless the authorization is terminated  or revoked sooner.       Influenza A by PCR NEGATIVE NEGATIVE Final   Influenza B by PCR NEGATIVE NEGATIVE Final    Comment: (NOTE) The Xpert Xpress SARS-CoV-2/FLU/RSV plus assay is intended as an aid in the diagnosis of influenza from Nasopharyngeal swab specimens and should not be used as a sole basis for treatment. Nasal washings and aspirates are unacceptable for Xpert Xpress SARS-CoV-2/FLU/RSV testing.  Fact Sheet for Patients: BloggerCourse.com  Fact Sheet for Healthcare Providers: SeriousBroker.it  This test is not yet approved or cleared by the [place] FDA and has been authorized for detection and/or diagnosis of SARS-CoV-2 by FDA under an Emergency Use Authorization (EUA). This EUA will remain in effect (meaning this test can  be used) for the duration of the COVID-19 declaration under Section 564(b)(1) of the Act, 21 U.S.C. section 360bbb-3(b)(1), unless the authorization is terminated or revoked.  Performed at [facility], [address] [place], [place] [address]   MRSA PCR Screening     Status: None   Collection Time: 10/26/20  5:04 AM   Specimen: Nasal Mucosa; Nasopharyngeal  Result Value Ref Range Status   MRSA by PCR NEGATIVE NEGATIVE Final  Comment:        The GeneXpert MRSA Assay (FDA approved for NASAL specimens only), is one component of a comprehensive MRSA colonization surveillance program. It is not intended to diagnose MRSA infection nor to guide or monitor treatment for MRSA infections. Performed at [facility], [address], [place], [place] [address]          Radiology Studies: No results found.      Scheduled Meds: . aspirin  81 mg Oral Daily  . atorvastatin  40 mg Oral Daily  . carvedilol  6.25 mg Oral BID WC  . cholecalciferol  1,000 Units Oral Daily  . heparin  5,000 Units Subcutaneous Q8H  . iron polysaccharides  150 mg Oral Daily  . mouth rinse  15 mL Mouth Rinse BID  . multivitamin with minerals  1 tablet Oral Daily   Continuous Infusions: . sodium chloride    . dextrose 5 % with KCl 20 mEq / L 20 mEq (10/27/20 0941)  . magnesium sulfate bolus IVPB    . potassium chloride       LOS: 9 days    Time spent: 27 minutes    Marrion Coy, MD [facility]   To contact the attending provider between 7A-7P or the covering provider during after hours 7P-7A, please log into the web site [email or web address] and access using universal [place] password for that web site. If you do not have the password, please call the hospital operator.  10/27/2020, 1:19 PM

## 2020-10-27 NOTE — TOC Progression Note (Signed)
Transition of Care [facility]) - Progression Note    Patient Details  Name: Dilpreet Faires MRN: [id number] Date of Birth: 1931-04-28  Transition of [facility] CM/SW Contact  Chapman Fitch, RN Phone Number: 10/27/2020, 3:40 PM  Clinical Narrative:    Notified by palliative that family has decided SNF with Hospice.  I called and spoke with Alinda Money and Marylene Land.  They confirm the plan is for patient to return to [facility] with [facility].   Notified Verlon Au at [facility].  She confirms they will be able to accept patient back tomorrow.  Family requesting private room.  I have notified Verlon Au, however family is aware that this is not guaranteed.   [facility] uses their own hospice services.   Fl2 sent for signature.  Repeat covid test to be ordered.  Bedside RN notified MD that patient has not had a BM   Expected Discharge Plan: Long Term Nursing Home    Expected Discharge Plan and Services Expected Discharge Plan: Long Term Nursing Home       Living arrangements for the past 2 months: Skilled Nursing Facility                                       Social Determinants of Health (SDOH) Interventions    Readmission Risk Interventions Readmission Risk Prevention Plan 10/21/2020  Transportation Screening Complete  PCP or Specialist Appt within 3-5 Days Complete  HRI or Home Care Consult Complete  Palliative Care Screening Complete  Medication Review (RN Care Manager) Complete  Some recent data might be hidden

## 2020-10-28 DIAGNOSIS — J690 Pneumonitis due to inhalation of food and vomit: Secondary | ICD-10-CM | POA: Diagnosis not present

## 2020-10-28 DIAGNOSIS — A419 Sepsis, unspecified organism: Secondary | ICD-10-CM | POA: Diagnosis not present

## 2020-10-28 DIAGNOSIS — E162 Hypoglycemia, unspecified: Secondary | ICD-10-CM | POA: Diagnosis not present

## 2020-10-28 DIAGNOSIS — E43 Unspecified severe protein-calorie malnutrition: Secondary | ICD-10-CM | POA: Diagnosis not present

## 2020-10-28 LAB — GLUCOSE, CAPILLARY
Glucose-Capillary: 117 mg/dL — ABNORMAL HIGH (ref 70–99)
Glucose-Capillary: 111 mg/dL — ABNORMAL HIGH (ref 70–99)

## 2020-10-28 LAB — CBC WITH DIFFERENTIAL/PLATELET
Abs Immature Granulocytes: 0.04 10*3/uL (ref 0.00–0.07)
Basophils Absolute: 0 10*3/uL (ref 0.0–0.1)
Basophils Relative: 0 %
Eosinophils Absolute: 0.1 10*3/uL (ref 0.0–0.5)
Eosinophils Relative: 4 %
HCT: 26.8 % — ABNORMAL LOW (ref 36.0–46.0)
Hemoglobin: 8.2 g/dL — ABNORMAL LOW (ref 12.0–15.0)
Immature Granulocytes: 1 %
Lymphocytes Relative: 42 %
Lymphs Abs: 1.4 10*3/uL (ref 0.7–4.0)
MCH: 28.4 pg (ref 26.0–34.0)
MCHC: 30.6 g/dL (ref 30.0–36.0)
MCV: 92.7 fL (ref 80.0–100.0)
Monocytes Absolute: 0.3 10*3/uL (ref 0.1–1.0)
Monocytes Relative: 8 %
Neutro Abs: 1.4 10*3/uL — ABNORMAL LOW (ref 1.7–7.7)
Neutrophils Relative %: 45 %
Platelets: 140 10*3/uL — ABNORMAL LOW (ref 150–400)
RBC: 2.89 MIL/uL — ABNORMAL LOW (ref 3.87–5.11)
RDW: 15.4 % (ref 11.5–15.5)
WBC: 3.2 10*3/uL — ABNORMAL LOW (ref 4.0–10.5)
nRBC: 0 % (ref 0.0–0.2)

## 2020-10-28 LAB — BASIC METABOLIC PANEL
Anion gap: 5 (ref 5–15)
BUN: 14 mg/dL (ref 8–23)
CO2: 25 mmol/L (ref 22–32)
Calcium: 7.7 mg/dL — ABNORMAL LOW (ref 8.9–10.3)
Chloride: 113 mmol/L — ABNORMAL HIGH (ref 98–111)
Creatinine, Ser: 0.66 mg/dL (ref 0.44–1.00)
GFR, Estimated: >60 mL/min (ref >60)
Glucose, Bld: 120 mg/dL — ABNORMAL HIGH (ref 70–99)
Potassium: 3.9 mmol/L (ref 3.5–5.1)
Sodium: 143 mmol/L (ref 135–145)

## 2020-10-28 LAB — MAGNESIUM: Magnesium: 1.7 mg/dL (ref 1.7–2.4)

## 2020-10-28 LAB — SARS CORONAVIRUS 2 (TAT 6-24 HRS): SARS Coronavirus 2: NEGATIVE

## 2020-10-28 MED ORDER — CARVEDILOL 6.25 MG PO TABS: 6.2500 mg | ORAL_TABLET | Freq: Two times a day (BID) | ORAL | Status: AC

## 2020-10-28 MED ORDER — LISINOPRIL 10 MG PO TABS: 10.0000 mg | ORAL_TABLET | Freq: Every day | ORAL | Status: AC

## 2020-10-28 NOTE — TOC Transition Note (Signed)
Transition of Care [facility]) - CM/SW Discharge Note   Patient Details  Name: Kim Roy MRN: [id number] Date of Birth: 06/23/1931  Transition of Care [facility]) CM/SW Contact:  Margarito Liner, LCSW Phone Number: 10/28/2020, 11:35 AM   Clinical Narrative:   Patient has orders to discharge to [facility] today. RN has called report. EMS transport has been arranged. She is 2nd on the list. No further concerns. CSW signing off.  Final next level of care: Skilled Nursing Facility Barriers to Discharge: Barriers Resolved   Patient Goals and CMS Choice        Discharge Placement   Existing PASRR number confirmed : 10/28/20          Patient chooses bed at: [facility] Patient to be transferred to facility by: EMS Name of family member notified: Danelly Hassinger Patient and family notified of of transfer: 10/28/20  Discharge Plan and Services                                     Social Determinants of Health (SDOH) Interventions     Readmission Risk Interventions Readmission Risk Prevention Plan 10/21/2020  Transportation Screening Complete  PCP or Specialist Appt within 3-5 Days Complete  HRI or Home Care Consult Complete  Palliative Care Screening Complete  Medication Review (RN Care Manager) Complete  Some recent data might be hidden

## 2020-10-28 NOTE — Progress Notes (Signed)
Patient was changed into her own clothes. EMS transported patient back to [facility].

## 2020-10-28 NOTE — Progress Notes (Signed)
Patient's daughter-in-law was called to report that patient had been discharged and transported to [facility].

## 2020-10-28 NOTE — Discharge Summary (Signed)
Physician Discharge Summary  Patient ID: Kim Roy MRN: [id number] DOB/AGE: 1930-09-24 87 y.o.  Admit date: 10/18/2020 Discharge date: 10/28/2020  Admission Diagnoses:  Discharge Diagnoses:  Principal Problem:   Altered mental status Active Problems:   Type II diabetes mellitus with renal manifestations (HCC)   Septic shock (HCC)   Aspiration pneumonia of right lung (HCC)   Acute renal failure (HCC)   Hypoglycemia   Choledocholithiasis   Anemia   Pressure injury of skin   Protein-calorie malnutrition, severe   Palliative care by specialist   Goals of care, counseling/discussion   Hypernatremia   Discharged Condition: fair  Hospital Course:  Kim Roy a 87 y.o.Fwith dementia, living in SNF, DM, HTN, and CKD IIIa who presented from [facility] with hypotension and decreased mentation.  In the ER, BP 60/30 and started on Fluids. She was admitted to the ICU. Found to have Lactate 7.5, leukocytosis, hypothermia and started on pressors and antibiotics. CT of the chest and abdomen showed aspiration pneumonia and possible choledocholithiasis.  #1.  Acute metabolic encephalopathy superimposed on chronic dementia. Severe hypernatremia.   Hypokalemia. Hypomagnesemia. Failure to thrive. Anorexia. Patient still has a poor appetite, but was able to eat when she was fed.  She received D5 water to improve her her hypernatremia, her appetite actually improving today. Patient general condition has been deteriorating over time, she will be referred to hospice as outpatient.  2.  Septic shock secondary to aspiration pneumonia. Aspiration pneumonia. Patient has completed antibiotics.  3.  Acute kidney injury on chronic kidney disease stage IIIa. Metabolic acidosis per Condition has improved  4.  Type 2 diabetes  with hypoglycemia. Hypoglycemia is due to poor p.o. intake.  Metformin was discontinued at time of admission.  5.  Anemia of chronic kidney  disease.  #6.  Pancytopenia. Appears to be chronic.    Consults: None  Significant Diagnostic Studies:   Treatments: antibiotics, IVF  Discharge Exam: Blood pressure (!) 143/79, pulse (!) 58, temperature (!) 97.5 F (36.4 C), temperature source Oral, resp. rate 16, height 5\' 5"  (1.651 m), weight 62 kg, SpO2 95 %. General appearance: alert, cooperative and malnurished, and confused Resp: clear to auscultation bilaterally Cardio: regular rate and rhythm, S1, S2 normal, no murmur, click, rub or gallop GI: soft, non-tender; bowel sounds normal; no masses,  no organomegaly Extremities: extremities normal, atraumatic, no cyanosis or edema  Disposition: Discharge disposition: 01-Home or Self Care       Discharge Instructions    Ambulatory referral to Hospice   Complete by: As directed    Diet - low sodium heart healthy   Complete by: As directed    Dysphagia 1 diet   Discharge wound care:   Complete by: As directed    RN to follow   Increase activity slowly   Complete by: As directed      Allergies as of 10/28/2020   No Known Allergies     Medication List    STOP taking these medications   amLODipine 10 MG tablet Commonly known as: NORVASC   lisinopril-hydrochlorothiazide 10-12.5 MG tablet Commonly known as: Zestoretic   metFORMIN 500 MG tablet Commonly known as: GLUCOPHAGE     TAKE these medications   acetaminophen 325 MG tablet Commonly known as: TYLENOL Take 2 tablets (650 mg total) by mouth every 6 (six) hours as needed for mild pain (or Fever >/= 101).   aspirin EC 81 MG tablet Take 81 mg by mouth daily. Swallow whole. What changed:  Another medication with the same name was removed. Continue taking this medication, and follow the directions you see here.   atorvastatin 40 MG tablet Commonly known as: LIPITOR Take 40 mg by mouth daily.   carvedilol 6.25 MG tablet Commonly known as: COREG Take 1 tablet (6.25 mg total) by mouth 2 (two) times daily  with a meal. What changed:   medication strength  how much to take  when to take this   cetirizine 10 MG tablet Commonly known as: ZYRTEC Take 10 mg by mouth daily.   Cholecalciferol 25 MCG (1000 UT) tablet Take 1,000 Units by mouth daily.   docusate sodium 100 MG capsule Commonly known as: COLACE Take 1 capsule (100 mg total) by mouth 2 (two) times daily.   feeding supplement Liqd Take 237 mLs by mouth daily.   iron polysaccharides 150 MG capsule Commonly known as: NIFEREX Take 1 capsule (150 mg total) by mouth daily.   lisinopril 10 MG tablet Commonly known as: ZESTRIL Take 1 tablet (10 mg total) by mouth daily.   mirtazapine 7.5 MG tablet Commonly known as: REMERON Take 7.5 mg by mouth at bedtime.   ondansetron 4 MG tablet Commonly known as: ZOFRAN Take 1 tablet (4 mg total) by mouth every 6 (six) hours as needed for nausea.   pantoprazole 40 MG tablet Commonly known as: PROTONIX Take 40 mg by mouth 2 (two) times daily.   senna 8.6 MG tablet Commonly known as: SENOKOT Take 2 tablets by mouth 2 (two) times daily.            Discharge Care Instructions  (From admission, onward)         Start     Ordered   10/28/20 0000  Discharge wound care:       Comments: RN to follow   10/28/20 0903          Follow-up Information    [facility] Follow up in 1 week(s).   Contact information: [address] [place] [address] [phone]              35 minutes Signed: Marrion Coy 10/28/2020, 9:03 AM

## 2021-02-19 DEATH — deceased

## 2022-07-27 IMAGING — CT CT CHEST W/O CM
3 of 5 series · 14 of 36 positions shown, 16 images · non-contrast
Comparison: None.

CLINICAL DATA: Sepsis, lactic acidosis

EXAM:
CT CHEST, ABDOMEN AND PELVIS WITHOUT CONTRAST
TECHNIQUE: Multidetector CT imaging of the chest, abdomen and pelvis was
performed following the standard protocol without IV contrast.

[Series 2: cap wo st · axial · 0.59mm/px · z∈[-740,-250]mm · 8 of 126 slices shown, 10 images (1 of 2)]
[im 14/126  mediastinal]
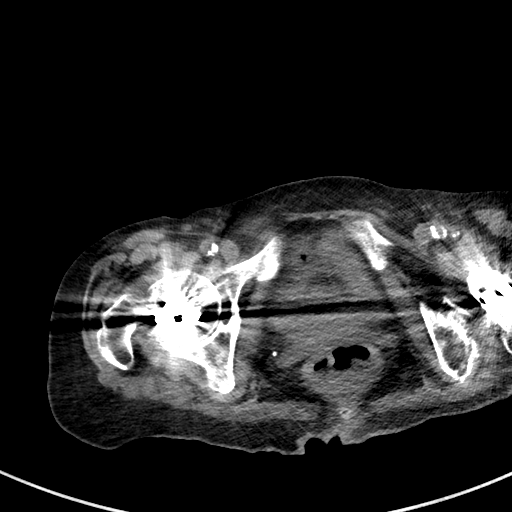
[im 14/126  lung]
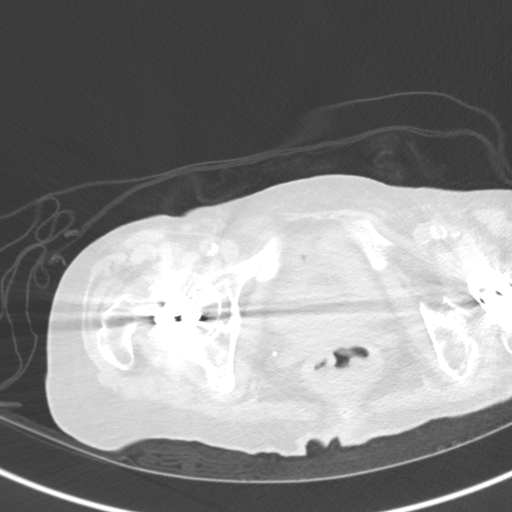
[im 28/126  lung]
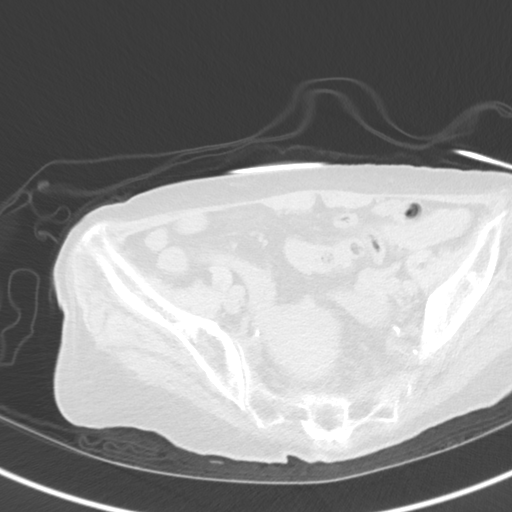
[im 42/126  lung]
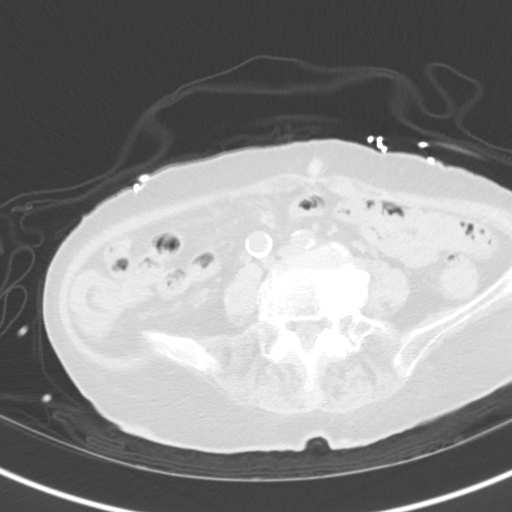
[im 56/126  lung]
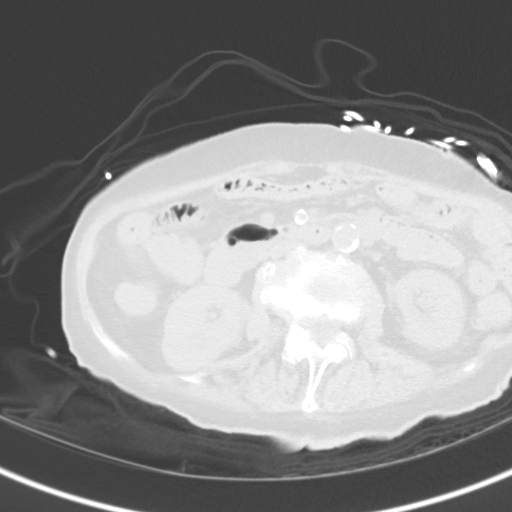
[im 70/126  mediastinal]
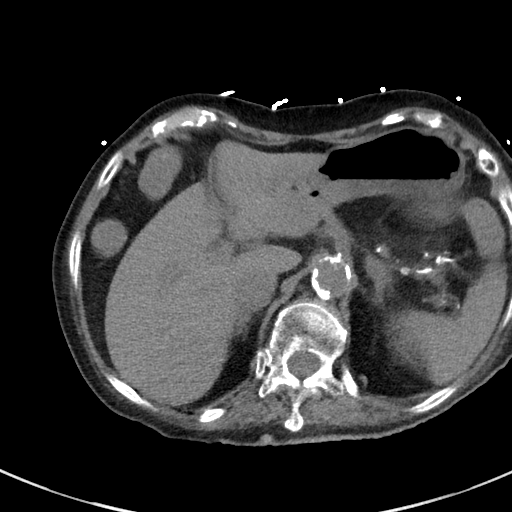
[im 70/126  lung]
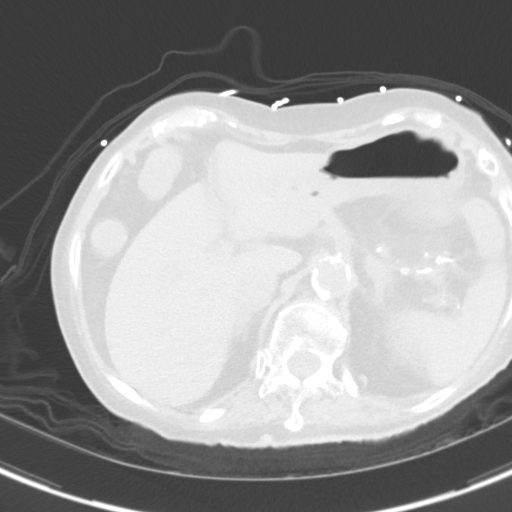
[im 84/126  lung]
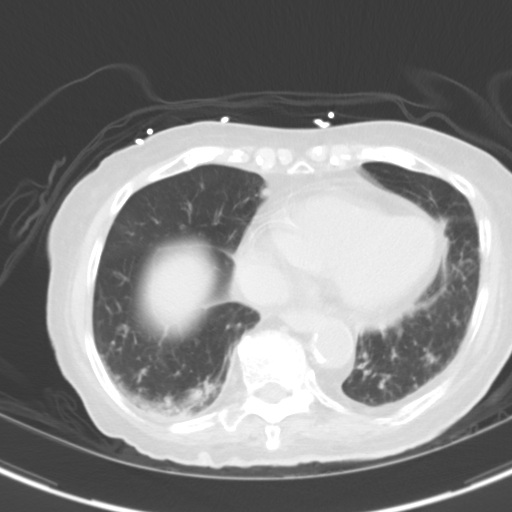
[im 98/126  lung]
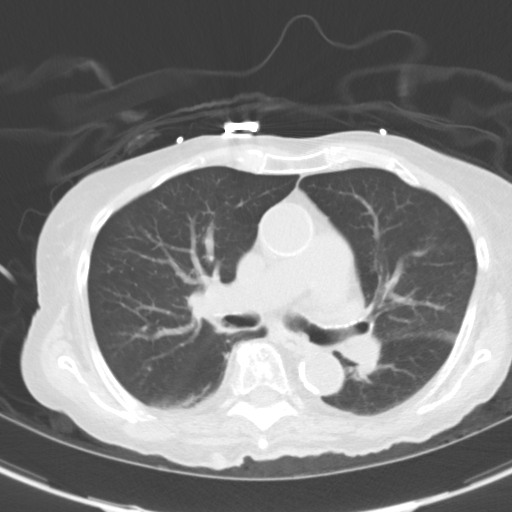
[im 112/126  lung]
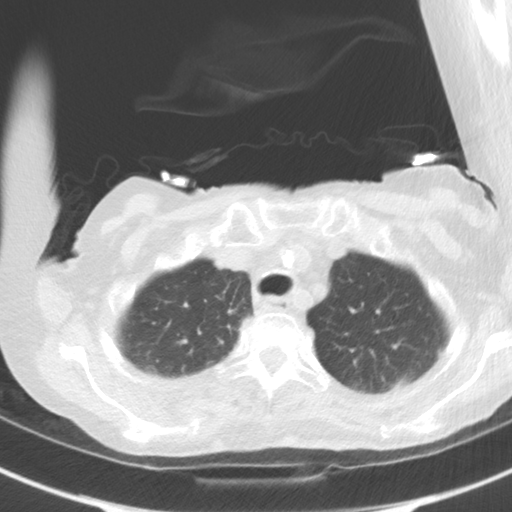

[Series 5: coronal · coronal · 0.74mm/px · 3 of 119 slices shown]
[im 24/119  lung]
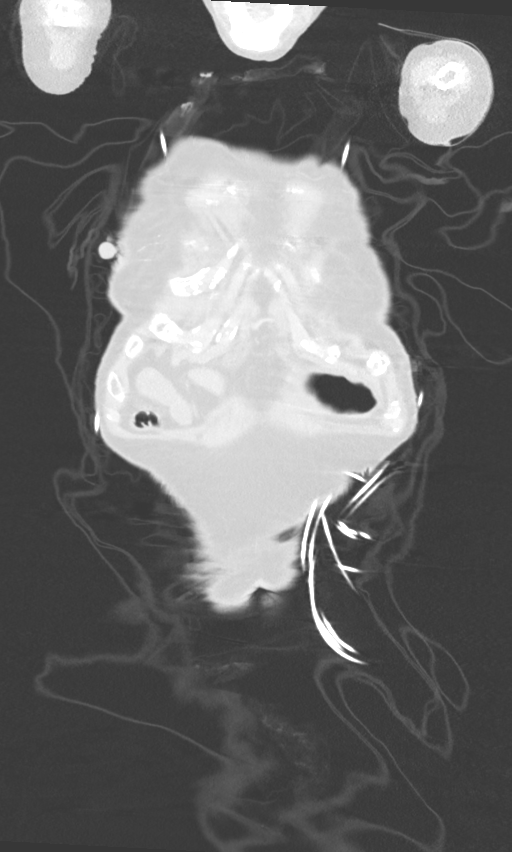
[im 48/119  lung]
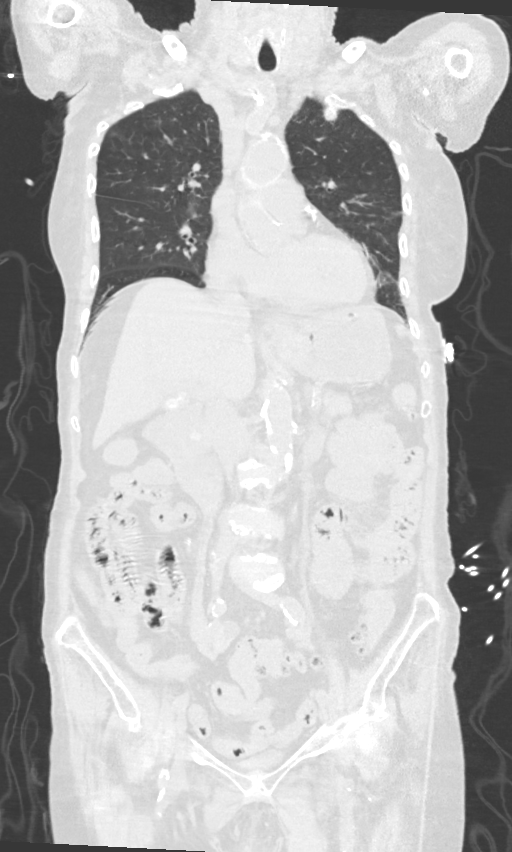
[im 71/119  lung]
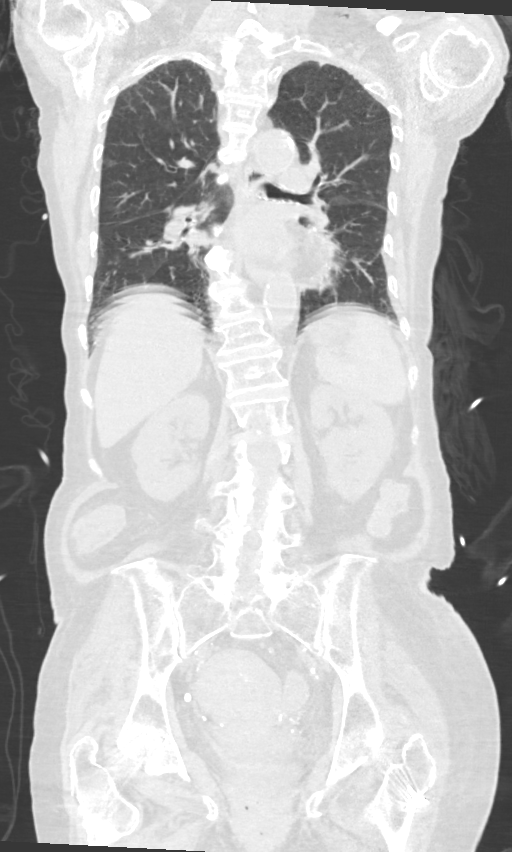

[Series 7: cap wo st · axial · 0.63mm/px · z∈[-730,-575]mm · 3 of 126 slices shown (2 of 2)]
[im 16/126  lung]
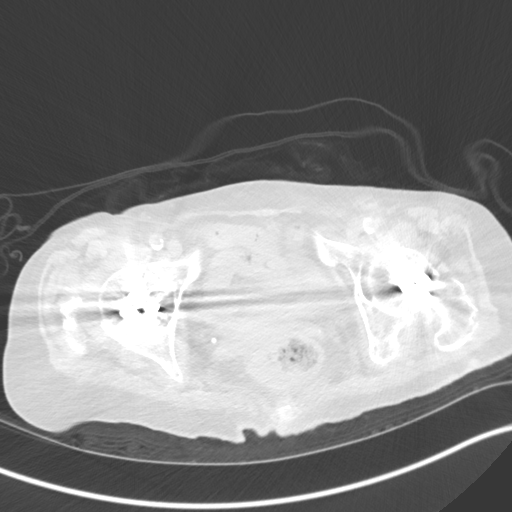
[im 32/126  lung]
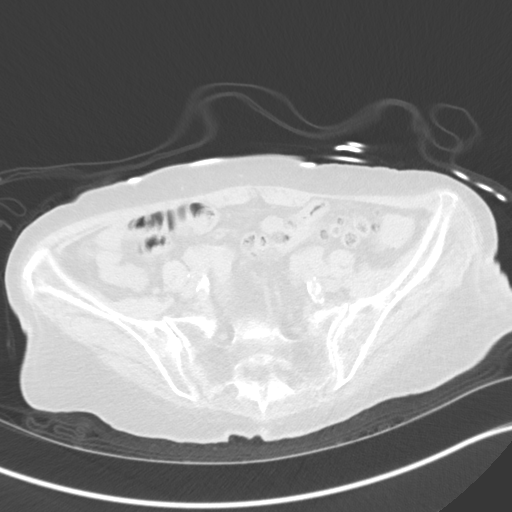
[im 47/126  lung]
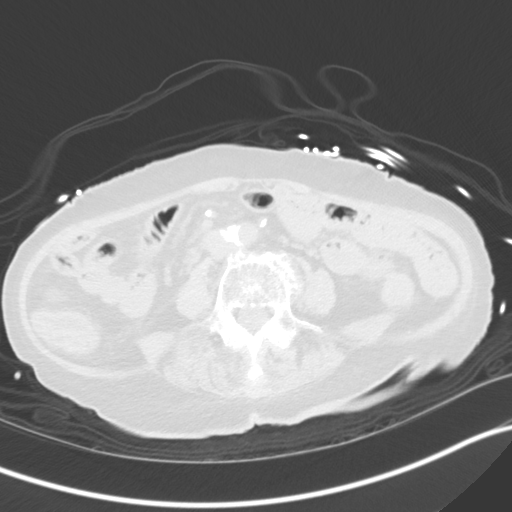

[14 of 36 positions shown; findings below may reference images not displayed]

FINDINGS: CT CHEST FINDINGS

Cardiovascular: Extensive multi-vessel coronary artery
calcification. Cardiac size within normal limits. No pericardial
effusion. Central pulmonary arteries are enlarged in keeping with
changes of pulmonary arterial hypertension. Extensive calcified
atherosclerotic plaque and arteriosclerosis is seen involving the
thoracic aorta. No aortic aneurysm.

Mediastinum/Nodes: No pathologic thoracic adenopathy. Esophagus
unremarkable. Thyroid unremarkable.

Lungs/Pleura: There is debris within the a right mainstem bronchus
and peripheral airway impaction within the right lower lobe. There
is ground-glass infiltrate noted peripherally within the right lower
lobe, best noted at the costophrenic angle. The findings are most
suggestive of aspiration, less likely acute infection. No
pneumothorax or pleural effusion. Mild parenchymal scarring at the
left lung base.

Musculoskeletal: No acute bone abnormality. No lytic or blastic bone
lesion.

CT ABDOMEN PELVIS FINDINGS

Hepatobiliary: Multiple calculi are seen within a contracted
gallbladder lumen. No pericholecystic inflammatory change.
Pneumobilia is present, possibly related prior sphincterotomy. There
is choledocholithiasis present with a 9 mm calculus seen within the
distal common duct, best noted on coronal image # 56. The
extrahepatic bile duct is mildly dilated. The liver is otherwise
unremarkable.

Pancreas: Unremarkable

Spleen: Unremarkable

Adrenals/Urinary Tract: The adrenal glands are unremarkable. 2 mm
nonobstructing calculus is seen within the lower pole of the right
kidney. Simple cortical cyst noted within the interpolar region of
the left kidney. The kidneys are otherwise unremarkable. The bladder
is decompressed and is largely obscured by streak artifact from
bilateral hip prostheses.

Stomach/Bowel: Moderate descending and sigmoid colonic
diverticulosis noted. The stomach, small bowel, and large bowel are
otherwise unremarkable. Appendix absent. No free intraperitoneal gas
or fluid.

Vascular/Lymphatic: There is extensive aortoiliac atherosclerotic
calcification. No aneurysm. No pathologic adenopathy within the
abdomen and pelvis.

Reproductive: Dense calcification within the uterine corpus likely
relates to an involuted uterine fibroid. The pelvic organs are
otherwise unremarkable.

Other: No abdominal wall hernia.  Rectum unremarkable.

Musculoskeletal: Bilateral femoral neck pinning has been performed.
No acute bone abnormality. No lytic or blastic bone lesions.
IMPRESSION: Choledocholithiasis. Mild pneumobilia is present, suggesting changes
of prior sphincterotomy.

Cholelithiasis.

Debris within the right mainstem bronchus, airway impaction within
the right lower lobe, and superimposed dependent infiltrate all in
keeping with probable changes of aspiration.

Minimal right nonobstructing nephrolithiasis.

Moderate distal colonic diverticulosis.

Aortic Atherosclerosis ([id number]).
